# Patient Record
Sex: Female | Born: 1950 | Race: White | Hispanic: No | Marital: Married | Smoking: Never smoker
Health system: Southern US, Community
[De-identification: ages and names within clinical notes are randomized; demographics above are authoritative.]

## PROBLEM LIST (undated history)

## (undated) DIAGNOSIS — K5900 Constipation, unspecified: Secondary | ICD-10-CM

## (undated) DIAGNOSIS — M1990 Unspecified osteoarthritis, unspecified site: Secondary | ICD-10-CM

## (undated) DIAGNOSIS — I10 Essential (primary) hypertension: Secondary | ICD-10-CM

## (undated) DIAGNOSIS — J309 Allergic rhinitis, unspecified: Secondary | ICD-10-CM

## (undated) DIAGNOSIS — C801 Malignant (primary) neoplasm, unspecified: Secondary | ICD-10-CM

## (undated) DIAGNOSIS — E559 Vitamin D deficiency, unspecified: Secondary | ICD-10-CM

## (undated) DIAGNOSIS — G250 Essential tremor: Secondary | ICD-10-CM

## (undated) DIAGNOSIS — Z8542 Personal history of malignant neoplasm of other parts of uterus: Secondary | ICD-10-CM

## (undated) DIAGNOSIS — K219 Gastro-esophageal reflux disease without esophagitis: Secondary | ICD-10-CM

## (undated) DIAGNOSIS — R079 Chest pain, unspecified: Secondary | ICD-10-CM

## (undated) DIAGNOSIS — E7800 Pure hypercholesterolemia, unspecified: Secondary | ICD-10-CM

## (undated) DIAGNOSIS — E782 Mixed hyperlipidemia: Secondary | ICD-10-CM

## (undated) DIAGNOSIS — T7840XA Allergy, unspecified, initial encounter: Secondary | ICD-10-CM

## (undated) DIAGNOSIS — F32A Depression, unspecified: Secondary | ICD-10-CM

## (undated) DIAGNOSIS — B0050 Herpesviral ocular disease, unspecified: Secondary | ICD-10-CM

## (undated) DIAGNOSIS — M549 Dorsalgia, unspecified: Secondary | ICD-10-CM

## (undated) DIAGNOSIS — R600 Localized edema: Secondary | ICD-10-CM

## (undated) DIAGNOSIS — K635 Polyp of colon: Secondary | ICD-10-CM

## (undated) DIAGNOSIS — R3129 Other microscopic hematuria: Secondary | ICD-10-CM

## (undated) DIAGNOSIS — R809 Proteinuria, unspecified: Secondary | ICD-10-CM

## (undated) DIAGNOSIS — M2550 Pain in unspecified joint: Secondary | ICD-10-CM

## (undated) DIAGNOSIS — K760 Fatty (change of) liver, not elsewhere classified: Secondary | ICD-10-CM

## (undated) DIAGNOSIS — R002 Palpitations: Secondary | ICD-10-CM

## (undated) DIAGNOSIS — F329 Major depressive disorder, single episode, unspecified: Secondary | ICD-10-CM

## (undated) DIAGNOSIS — K589 Irritable bowel syndrome without diarrhea: Secondary | ICD-10-CM

## (undated) HISTORY — DX: Allergy, unspecified, initial encounter: T78.40XA

## (undated) HISTORY — DX: Polyp of colon: K63.5

## (undated) HISTORY — DX: Essential (primary) hypertension: I10

## (undated) HISTORY — DX: Allergic rhinitis, unspecified: J30.9

## (undated) HISTORY — DX: Essential tremor: G25.0

## (undated) HISTORY — DX: Depression, unspecified: F32.A

## (undated) HISTORY — DX: Other microscopic hematuria: R31.29

## (undated) HISTORY — PX: OTHER SURGICAL HISTORY: SHX169

## (undated) HISTORY — DX: Malignant (primary) neoplasm, unspecified: C80.1

## (undated) HISTORY — DX: Proteinuria, unspecified: R80.9

## (undated) HISTORY — DX: Herpesviral ocular disease, unspecified: B00.50

## (undated) HISTORY — DX: Irritable bowel syndrome, unspecified: K58.9

## (undated) HISTORY — DX: Personal history of malignant neoplasm of other parts of uterus: Z85.42

## (undated) HISTORY — DX: Palpitations: R00.2

## (undated) HISTORY — PX: ABDOMINAL HYSTERECTOMY: SHX81

## (undated) HISTORY — DX: Gastro-esophageal reflux disease without esophagitis: K21.9

## (undated) HISTORY — DX: Chest pain, unspecified: R07.9

## (undated) HISTORY — DX: Fatty (change of) liver, not elsewhere classified: K76.0

## (undated) HISTORY — DX: Localized edema: R60.0

## (undated) HISTORY — PX: APPENDECTOMY: SHX54

## (undated) HISTORY — DX: Mixed hyperlipidemia: E78.2

## (undated) HISTORY — DX: Pain in unspecified joint: M25.50

## (undated) HISTORY — PX: TUBAL LIGATION: SHX77

## (undated) HISTORY — DX: Vitamin D deficiency, unspecified: E55.9

## (undated) HISTORY — DX: Pure hypercholesterolemia, unspecified: E78.00

## (undated) HISTORY — DX: Unspecified osteoarthritis, unspecified site: M19.90

## (undated) HISTORY — DX: Constipation, unspecified: K59.00

## (undated) HISTORY — DX: Major depressive disorder, single episode, unspecified: F32.9

## (undated) HISTORY — DX: Dorsalgia, unspecified: M54.9

---

## 2000-04-01 ENCOUNTER — Other Ambulatory Visit: Admission: RE | Admit: 2000-04-01 | Discharge: 2000-04-01 | Payer: Self-pay | Admitting: Obstetrics & Gynecology

## 2002-06-22 ENCOUNTER — Other Ambulatory Visit: Admission: RE | Admit: 2002-06-22 | Discharge: 2002-06-22 | Payer: Self-pay | Admitting: Obstetrics & Gynecology

## 2003-06-28 ENCOUNTER — Other Ambulatory Visit: Admission: RE | Admit: 2003-06-28 | Discharge: 2003-06-28 | Payer: Self-pay | Admitting: Obstetrics & Gynecology

## 2004-08-13 ENCOUNTER — Other Ambulatory Visit: Admission: RE | Admit: 2004-08-13 | Discharge: 2004-08-13 | Payer: Self-pay | Admitting: Obstetrics & Gynecology

## 2005-06-20 ENCOUNTER — Encounter: Admission: RE | Admit: 2005-06-20 | Discharge: 2005-07-07 | Payer: Self-pay | Admitting: *Deleted

## 2006-01-23 DIAGNOSIS — K635 Polyp of colon: Secondary | ICD-10-CM

## 2006-01-23 HISTORY — DX: Polyp of colon: K63.5

## 2006-10-09 ENCOUNTER — Ambulatory Visit: Payer: Self-pay

## 2009-07-08 DIAGNOSIS — Z8542 Personal history of malignant neoplasm of other parts of uterus: Secondary | ICD-10-CM

## 2009-07-08 HISTORY — DX: Personal history of malignant neoplasm of other parts of uterus: Z85.42

## 2009-07-12 ENCOUNTER — Ambulatory Visit: Admission: RE | Admit: 2009-07-12 | Discharge: 2009-07-12 | Payer: Self-pay | Admitting: Gynecology

## 2009-07-25 ENCOUNTER — Inpatient Hospital Stay (HOSPITAL_COMMUNITY): Admission: RE | Admit: 2009-07-25 | Discharge: 2009-07-27 | Payer: Self-pay | Admitting: Gynecology

## 2009-07-25 ENCOUNTER — Encounter (INDEPENDENT_AMBULATORY_CARE_PROVIDER_SITE_OTHER): Payer: Self-pay | Admitting: Obstetrics & Gynecology

## 2009-09-07 ENCOUNTER — Ambulatory Visit: Admission: RE | Admit: 2009-09-07 | Discharge: 2009-09-07 | Payer: Self-pay | Admitting: Gynecologic Oncology

## 2009-10-26 ENCOUNTER — Other Ambulatory Visit: Admission: RE | Admit: 2009-10-26 | Discharge: 2009-10-26 | Payer: Self-pay | Admitting: Gynecologic Oncology

## 2009-10-26 ENCOUNTER — Ambulatory Visit: Admission: RE | Admit: 2009-10-26 | Discharge: 2009-10-26 | Payer: Self-pay | Admitting: Gynecologic Oncology

## 2010-03-01 ENCOUNTER — Encounter: Admission: RE | Admit: 2010-03-01 | Discharge: 2010-03-01 | Payer: Self-pay | Admitting: Surgery

## 2010-03-08 HISTORY — PX: HERNIA REPAIR: SHX51

## 2010-04-03 ENCOUNTER — Ambulatory Visit (HOSPITAL_COMMUNITY): Admission: RE | Admit: 2010-04-03 | Discharge: 2010-04-05 | Payer: Self-pay | Admitting: Surgery

## 2010-05-30 ENCOUNTER — Other Ambulatory Visit: Admission: RE | Admit: 2010-05-30 | Discharge: 2010-05-30 | Payer: Self-pay | Admitting: Gynecology

## 2010-05-30 ENCOUNTER — Ambulatory Visit
Admission: RE | Admit: 2010-05-30 | Discharge: 2010-05-30 | Payer: Self-pay | Source: Home / Self Care | Admitting: Gynecology

## 2010-09-20 LAB — DIFFERENTIAL
Basophils Absolute: 0 10*3/uL (ref 0.0–0.1)
Basophils Relative: 0 % (ref 0–1)
Eosinophils Absolute: 0.2 10*3/uL (ref 0.0–0.7)
Eosinophils Relative: 3 % (ref 0–5)
Lymphs Abs: 2.4 10*3/uL (ref 0.7–4.0)
Monocytes Absolute: 0.6 10*3/uL (ref 0.1–1.0)
Monocytes Relative: 9 % (ref 3–12)
Neutrophils Relative %: 52 % (ref 43–77)

## 2010-09-20 LAB — BASIC METABOLIC PANEL
BUN: 18 mg/dL (ref 6–23)
Chloride: 108 mEq/L (ref 96–112)
Creatinine, Ser: 0.83 mg/dL (ref 0.4–1.2)
GFR calc non Af Amer: >60 mL/min (ref >60)
Potassium: 4.1 mEq/L (ref 3.5–5.1)

## 2010-09-20 LAB — SURGICAL PCR SCREEN: Staphylococcus aureus: POSITIVE — AB

## 2010-09-20 LAB — CBC
Hemoglobin: 13.2 g/dL (ref 12.0–15.0)
RDW: 14.3 % (ref 11.5–15.5)
WBC: 6.7 10*3/uL (ref 4.0–10.5)

## 2010-09-23 LAB — COMPREHENSIVE METABOLIC PANEL
ALT: 33 U/L (ref 0–35)
AST: 28 U/L (ref 0–37)
Albumin: 4.2 g/dL (ref 3.5–5.2)
Alkaline Phosphatase: 69 U/L (ref 39–117)
CO2: 31 mEq/L (ref 19–32)
Chloride: 102 mEq/L (ref 96–112)
Creatinine, Ser: 0.88 mg/dL (ref 0.4–1.2)
Glucose, Bld: 90 mg/dL (ref 70–99)
Potassium: 3.8 mEq/L (ref 3.5–5.1)
Sodium: 141 mEq/L (ref 135–145)
Total Protein: 7.6 g/dL (ref 6.0–8.3)

## 2010-09-23 LAB — DIFFERENTIAL
Basophils Relative: 0 % (ref 0–1)
Eosinophils Absolute: 0.1 10*3/uL (ref 0.0–0.7)
Lymphocytes Relative: 44 % (ref 12–46)

## 2010-09-23 LAB — CBC
MCHC: 32.8 g/dL (ref 30.0–36.0)
MCV: 86.2 fL (ref 78.0–100.0)
WBC: 6.6 10*3/uL (ref 4.0–10.5)

## 2010-09-23 LAB — ABO/RH: ABO/RH(D): O POS

## 2010-09-23 LAB — TYPE AND SCREEN: ABO/RH(D): O POS

## 2010-09-24 LAB — CBC
HCT: 34.8 % — ABNORMAL LOW (ref 36.0–46.0)
Hemoglobin: 11.4 g/dL — ABNORMAL LOW (ref 12.0–15.0)
MCV: 86.2 fL (ref 78.0–100.0)
RBC: 4.04 MIL/uL (ref 3.87–5.11)
WBC: 10.3 10*3/uL (ref 4.0–10.5)

## 2010-09-24 LAB — BASIC METABOLIC PANEL
BUN: 10 mg/dL (ref 6–23)
CO2: 26 mEq/L (ref 19–32)
Calcium: 8.4 mg/dL (ref 8.4–10.5)
Chloride: 104 mEq/L (ref 96–112)
Creatinine, Ser: 0.96 mg/dL (ref 0.4–1.2)
GFR calc Af Amer: >60 mL/min (ref >60)
GFR calc non Af Amer: 60 mL/min — ABNORMAL LOW (ref >60)
Glucose, Bld: 171 mg/dL — ABNORMAL HIGH (ref 70–99)
Potassium: 4.4 mEq/L (ref 3.5–5.1)
Sodium: 137 mEq/L (ref 135–145)

## 2011-05-15 ENCOUNTER — Encounter: Payer: Self-pay | Admitting: Physician Assistant

## 2011-05-15 DIAGNOSIS — B0050 Herpesviral ocular disease, unspecified: Secondary | ICD-10-CM | POA: Insufficient documentation

## 2011-05-15 DIAGNOSIS — I10 Essential (primary) hypertension: Secondary | ICD-10-CM | POA: Insufficient documentation

## 2011-05-15 DIAGNOSIS — J309 Allergic rhinitis, unspecified: Secondary | ICD-10-CM | POA: Insufficient documentation

## 2011-05-15 DIAGNOSIS — G250 Essential tremor: Secondary | ICD-10-CM | POA: Insufficient documentation

## 2011-05-15 DIAGNOSIS — Z8542 Personal history of malignant neoplasm of other parts of uterus: Secondary | ICD-10-CM | POA: Insufficient documentation

## 2011-05-15 DIAGNOSIS — E782 Mixed hyperlipidemia: Secondary | ICD-10-CM | POA: Insufficient documentation

## 2011-05-15 DIAGNOSIS — G43909 Migraine, unspecified, not intractable, without status migrainosus: Secondary | ICD-10-CM | POA: Insufficient documentation

## 2011-06-24 ENCOUNTER — Ambulatory Visit: Payer: BC Managed Care – PPO

## 2011-06-24 DIAGNOSIS — R7989 Other specified abnormal findings of blood chemistry: Secondary | ICD-10-CM

## 2011-06-29 ENCOUNTER — Ambulatory Visit (INDEPENDENT_AMBULATORY_CARE_PROVIDER_SITE_OTHER): Payer: BC Managed Care – PPO

## 2011-06-29 DIAGNOSIS — R7309 Other abnormal glucose: Secondary | ICD-10-CM

## 2011-06-29 DIAGNOSIS — R319 Hematuria, unspecified: Secondary | ICD-10-CM

## 2011-07-10 ENCOUNTER — Encounter: Payer: Self-pay | Admitting: Gynecologic Oncology

## 2011-07-12 ENCOUNTER — Other Ambulatory Visit (HOSPITAL_COMMUNITY)
Admission: RE | Admit: 2011-07-12 | Discharge: 2011-07-12 | Disposition: A | Discharge status: Home / Self Care | Payer: BC Managed Care – PPO | Source: Ambulatory Visit | Attending: Gynecology | Admitting: Gynecology

## 2011-07-12 ENCOUNTER — Ambulatory Visit: Payer: BC Managed Care – PPO | Attending: Gynecology | Admitting: Gynecology

## 2011-07-12 ENCOUNTER — Encounter: Payer: Self-pay | Admitting: Gynecology

## 2011-07-12 VITALS — BP 132/80 | HR 62 | Temp 97.9°F | Resp 16 | Ht 61.22 in | Wt 175.1 lb

## 2011-07-12 DIAGNOSIS — Z01419 Encounter for gynecological examination (general) (routine) without abnormal findings: Secondary | ICD-10-CM | POA: Insufficient documentation

## 2011-07-12 DIAGNOSIS — Z90710 Acquired absence of both cervix and uterus: Secondary | ICD-10-CM | POA: Insufficient documentation

## 2011-07-12 DIAGNOSIS — Z9079 Acquired absence of other genital organ(s): Secondary | ICD-10-CM | POA: Insufficient documentation

## 2011-07-12 DIAGNOSIS — C541 Malignant neoplasm of endometrium: Secondary | ICD-10-CM

## 2011-07-12 DIAGNOSIS — Z79899 Other long term (current) drug therapy: Secondary | ICD-10-CM | POA: Insufficient documentation

## 2011-07-12 DIAGNOSIS — C549 Malignant neoplasm of corpus uteri, unspecified: Secondary | ICD-10-CM | POA: Insufficient documentation

## 2011-07-12 NOTE — Patient Instructions (Signed)
Return to the care of Dr. Aldona Bar for exams every 6 months to complete 5 years of followup

## 2011-07-12 NOTE — Progress Notes (Signed)
Consult Note: Gyn-Onc   Carly Walker 61 y.o. female  Chief Complaint  Patient presents with  . Endo cancer    Follow up    Interval History: The patient returns today for annual checkup. Since her last visit she's done well she denies any GI or GU symptoms except for recently diagnosed hematuria. She is scheduled to see a urologist in the near future. She denies any pelvic pain pressure vaginal bleeding or discharge. Functional status is excellent.  HPI: Stage IA grade 1 endometrial cancer undergoing initial TAH/BSO in January 2011. There is no myometrial invasion and no adjuvant therapy was recommended  Allergies  Allergen Reactions  . Ace Inhibitors Cough  . Codeine Nausea Only  . Sulfa Antibiotics Hives    Past Medical History  Diagnosis Date  . Migraine   . Allergic rhinitis, cause unspecified   . Essential hypertension, benign   . Mixed hyperlipidemia   . History of endometrial cancer 07/2009  . Tremor, essential   . Ophthalmic herpes simplex     Past Surgical History  Procedure Date  . Appendectomy   . Hernia repair 03/2010  . Tubal ligation   . Cesarean section   . Repeat cesarean section   . Abdominal hysterectomy     Current Outpatient Prescriptions  Medication Sig Dispense Refill  . acyclovir (ZOVIRAX) 400 MG tablet Take 400 mg by mouth daily. Ophthalmic HSV 1       . B Complex-C (B-COMPLEX WITH VITAMIN C) tablet Take 1 tablet by mouth daily.        . Cholecalciferol (D-3-5) 5000 UNITS capsule Take 5,000 Units by mouth daily.        . fluticasone (FLONASE) 50 MCG/ACT nasal spray Place 2 sprays into the nose daily.        Marland Kitchen loteprednol (LOTEMAX) 0.5 % ophthalmic suspension Place 1 drop into the right eye daily.        . Multiple Vitamin (MULTIVITAMIN) tablet Take 1 tablet by mouth daily. Over the counter, for over 50 individuals( called 50+)       . Omega-3 Fatty Acids (FISH OIL PO) Take 1,400 mg by mouth 2 (two) times daily.        . pravastatin  (PRAVACHOL) 40 MG tablet Take 40 mg by mouth daily.        . propranolol (INDERAL LA) 80 MG 24 hr capsule Take 80 mg by mouth daily.        . Red Yeast Rice 600 MG CAPS Take 600 mg by mouth 2 (two) times daily.          History   Social History  . Marital Status: Married    Spouse Name: N/A    Number of Children: N/A  . Years of Education: N/A   Occupational History  . Not on file.   Social History Main Topics  . Smoking status: Never Smoker   . Smokeless tobacco: Not on file  . Alcohol Use: No  . Drug Use: No  . Sexually Active: Not Currently   Other Topics Concern  . Not on file   Social History Narrative  . No narrative on file    Family History  Problem Relation Age of Onset  . Breast cancer Mother     Review of Systems: 10 point review of systems is negative except as noted above  Vitals: Blood pressure 132/80, pulse 62, temperature 97.9 F (36.6 C), temperature source Oral, resp. rate 16, height 5' 1.22" (1.555 m),  weight 175 lb 1.6 oz (79.425 kg).  Physical Exam: In general this is a healthy white female no acute distress HEENT is negative  Neck supple thyromegaly  There is no supraclavicular or inguinal adenopathy  The abdomen is obese soft nontender no masses organomegaly ascites or hernias are noted.  Pelvic exam  EGBUS vagina bladder urethra are normal  The vaginal cuff is well supported no lesions are noted.  Bimanual exam reveals no masses nodularity or induration. Rectovaginal exam confirms. Cervix and uterus are surgically absent.  Assessment/Plan: Stage IA grade 1 endometrial adenocarcinoma initially diagnosed January 2011. [name] clinically free of disease  Pap smears her obtained  At this juncture we will return the patient to the care of Dr. Annamaria Helling. We will suggest she be seen every 6 months to complete 5 years of followup.   Jeannette Corpus, MD 07/12/2011, 10:20 AM                           Consult Note: Gyn-Onc   Carly Walker 61 y.o. female  Chief Complaint  Patient presents with  . Endo cancer    Follow up    Interval History:   HPI:  Allergies  Allergen Reactions  . Ace Inhibitors Cough  . Codeine Nausea Only  . Sulfa Antibiotics Hives    Past Medical History  Diagnosis Date  . Migraine   . Allergic rhinitis, cause unspecified   . Essential hypertension, benign   . Mixed hyperlipidemia   . History of endometrial cancer 07/2009  . Tremor, essential   . Ophthalmic herpes simplex     Past Surgical History  Procedure Date  . Appendectomy   . Hernia repair 03/2010  . Tubal ligation   . Cesarean section   . Repeat cesarean section   . Abdominal hysterectomy     Current Outpatient Prescriptions  Medication Sig Dispense Refill  . acyclovir (ZOVIRAX) 400 MG tablet Take 400 mg by mouth daily. Ophthalmic HSV 1       . B Complex-C (B-COMPLEX WITH VITAMIN C) tablet Take 1 tablet by mouth daily.        . Cholecalciferol (D-3-5) 5000 UNITS capsule Take 5,000 Units by mouth daily.        . fluticasone (FLONASE) 50 MCG/ACT nasal spray Place 2 sprays into the nose daily.        Marland Kitchen loteprednol (LOTEMAX) 0.5 % ophthalmic suspension Place 1 drop into the right eye daily.        . Multiple Vitamin (MULTIVITAMIN) tablet Take 1 tablet by mouth daily. Over the counter, for over 50 individuals( called 50+)       . Omega-3 Fatty Acids (FISH OIL PO) Take 1,400 mg by mouth 2 (two) times daily.        . pravastatin (PRAVACHOL) 40 MG tablet Take 40 mg by mouth daily.        . propranolol (INDERAL LA) 80 MG 24 hr capsule Take 80 mg by mouth daily.        . Red Yeast Rice 600 MG CAPS Take 600 mg by mouth 2 (two) times daily.          History   Social History  . Marital Status: Married    Spouse Name: N/A    Number of Children: N/A  . Years of Education: N/A   Occupational History  . Not on file.   Social History Main Topics  . Smoking status:  Never Smoker   .  Smokeless tobacco: Not on file  . Alcohol Use: No  . Drug Use: No  . Sexually Active: Not Currently   Other Topics Concern  . Not on file   Social History Narrative  . No narrative on file    Family History  Problem Relation Age of Onset  . Breast cancer Mother     Review of Systems:  Vitals: Blood pressure 132/80, pulse 62, temperature 97.9 F (36.6 C), temperature source Oral, resp. rate 16, height 5' 1.22" (1.555 m), weight 175 lb 1.6 oz (79.425 kg).  Physical Exam:  Assessment/Plan:   CLARKE-PEARSON,Hulet Ehrmann L, MD 07/12/2011, 10:20 AM

## 2011-07-12 NOTE — Progress Notes (Signed)
Addended by: Randel Pigg on: 07/12/2011 11:32 AM   Modules accepted: Orders

## 2011-07-29 ENCOUNTER — Telehealth: Payer: Self-pay | Admitting: *Deleted

## 2011-07-29 NOTE — Telephone Encounter (Signed)
Message left for pt with PAP results  

## 2011-09-12 ENCOUNTER — Encounter: Payer: Self-pay | Admitting: Physician Assistant

## 2011-11-06 ENCOUNTER — Ambulatory Visit (INDEPENDENT_AMBULATORY_CARE_PROVIDER_SITE_OTHER): Payer: BC Managed Care – PPO | Admitting: Physician Assistant

## 2011-11-06 VITALS — BP 124/79 | HR 56 | Temp 98.1°F | Resp 18 | Ht 61.5 in | Wt 171.0 lb

## 2011-11-06 DIAGNOSIS — S61209A Unspecified open wound of unspecified finger without damage to nail, initial encounter: Secondary | ICD-10-CM

## 2011-11-06 DIAGNOSIS — Z23 Encounter for immunization: Secondary | ICD-10-CM

## 2011-11-06 DIAGNOSIS — S61009A Unspecified open wound of unspecified thumb without damage to nail, initial encounter: Secondary | ICD-10-CM

## 2011-11-06 MED ORDER — AMOXICILLIN-POT CLAVULANATE 875-125 MG PO TABS: 1.0000 | ORAL_TABLET | Freq: Two times a day (BID) | ORAL | Status: AC

## 2011-11-06 NOTE — Progress Notes (Signed)
Patient ID: Carly Walker MRN: [id number], DOB: 06-04-1951, 61 y.o. Date of Encounter: 11/06/2011, 5:05 PM  Primary Physician: No primary provider on file.  Chief Complaint: Wound to right thumb  HPI: 61 y.o. year old female with history below presents with superficial wound to the tuft of the right thumb. 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 She was separating two dogs that some customers were looking at. She had the dogs separated without issue and she hit her thumb along a piece of the chain link fence that was exposed. She is very certain that it was not a dog that bit her. Both dogs have been fully vaccinated including rabies per the patient account. She immediately washed the wound thoroughly with soap and water followed by the application antibiotic ointment. Her last tetanus vaccine was about 10 years prior.    Past Medical History  Diagnosis Date  . Migraine   . Allergic rhinitis, cause unspecified   . Essential hypertension, benign   . Mixed hyperlipidemia   . History of endometrial cancer 07/2009  . Tremor, essential   . Ophthalmic herpes simplex      Home Meds: Prior to Admission medications   Medication Sig Start Date End Date Taking? Authorizing Provider  acyclovir (ZOVIRAX) 400 MG tablet Take 400 mg by mouth daily. Ophthalmic HSV 1    Yes Historical Provider, MD  B Complex-C (B-COMPLEX WITH VITAMIN C) tablet Take 1 tablet by mouth daily.     Yes Historical Provider, MD  Cholecalciferol (D-3-5) 5000 UNITS capsule Take 5,000 Units by mouth daily.     Yes Historical Provider, MD  fluticasone (FLONASE) 50 MCG/ACT nasal spray Place 2 sprays into the nose daily.     Yes Historical Provider, MD  Multiple Vitamin (MULTIVITAMIN) tablet Take 1 tablet by mouth daily. Over the counter, for over 50 individuals( called 50+)    Yes Historical Provider, MD  Omega-3 Fatty Acids (FISH OIL PO) Take 1,400 mg by mouth 2 (two) times daily.     Yes Historical Provider, MD    pravastatin (PRAVACHOL) 40 MG tablet Take 40 mg by mouth daily.     Yes Historical Provider, MD  propranolol (INDERAL LA) 80 MG 24 hr capsule Take 80 mg by mouth daily.     Yes Historical Provider, MD  Red Yeast Rice 600 MG CAPS Take 600 mg by mouth 2 (two) times daily.     Yes Historical Provider, MD         loteprednol (LOTEMAX) 0.5 % ophthalmic suspension Place 1 drop into the right eye daily.      Historical Provider, MD    Allergies:  Allergies  Allergen Reactions  . Ace Inhibitors Cough  . Codeine Nausea Only  . Sulfa Antibiotics Hives    History   Social History  . Marital Status: Married    Spouse Name: N/A    Number of Children: N/A  . Years of Education: N/A   Occupational History  . Not on file.   Social History Main Topics  . Smoking status: Never Smoker   . Smokeless tobacco: Not on file  . Alcohol Use: No  . Drug Use: No  . Sexually Active: Not Currently   Other Topics Concern  . Not on file   Social History Narrative  . No narrative on file     Review of Systems: Constitutional: negative for chills, fever, night sweats, weight changes, or fatigue  HEENT: negative for vision changes, or hearing  loss Cardiovascular: negative for chest pain or palpitations Respiratory: negative for hemoptysis, wheezing, shortness of breath, or cough Abdominal: negative for abdominal pain, nausea, vomiting, diarrhea, or constipation Dermatological: negative for rash Neurologic: negative for headache, dizziness, or syncope All other systems reviewed and are otherwise negative with the exception to those above and in the HPI.   Physical Exam: Blood pressure 124/79, pulse 56, temperature 98.1 F (36.7 C), temperature source Oral, resp. rate 18, height 5' 1.5" (1.562 m), weight 171 lb (77.565 kg)., Body mass index is 31.79 kg/(m^2). General: Well developed, well nourished, in no acute distress. Head: Normocephalic, atraumatic, eyes without discharge, sclera non-icteric,  nares are without discharge.   Neck: Supple. No thyromegaly. Full ROM. No lymphadenopathy. Lungs: Clear bilaterally to auscultation without wheezes, rales, or rhonchi. Breathing is unlabored. Heart: RRR with S1 S2. No murmurs, rubs, or gallops appreciated. Msk:  Strength and tone normal for age. Extremities/Skin: Tuft of right thumb with with superficial wound. No erythema or STS. No TTP. FROM. 5/5 strength. Full flexion and extension. Cap refill less than 2 seconds. Warm and dry. No clubbing or cyanosis. No edema. No rashes or suspicious lesions. Neuro: Alert and oriented X 3. Moves all extremities spontaneously. Gait is normal. CNII-XII grossly in tact. Psych:  Responds to questions appropriately with a normal affect.     ASSESSMENT AND PLAN:  61 y.o. year old female with superficial wound to distal right thumb secondary to hitting in along a chain link fence -Augmentin 875/125 mg #20 1 po bid no RF -TDaP today -Washed and dressed -RTC precautions  Signed, Eula Listen, PA-C 11/06/2011 5:05 PM

## 2012-09-04 ENCOUNTER — Encounter: Payer: Self-pay | Admitting: Physician Assistant

## 2012-09-04 DIAGNOSIS — R3129 Other microscopic hematuria: Secondary | ICD-10-CM | POA: Insufficient documentation

## 2012-09-04 DIAGNOSIS — N3941 Urge incontinence: Secondary | ICD-10-CM | POA: Insufficient documentation

## 2012-09-24 ENCOUNTER — Ambulatory Visit (INDEPENDENT_AMBULATORY_CARE_PROVIDER_SITE_OTHER): Payer: BC Managed Care – PPO | Admitting: Emergency Medicine

## 2012-09-24 VITALS — BP 182/84 | HR 77 | Temp 98.4°F | Resp 18 | Wt 175.0 lb

## 2012-09-24 DIAGNOSIS — I10 Essential (primary) hypertension: Secondary | ICD-10-CM

## 2012-09-24 DIAGNOSIS — E785 Hyperlipidemia, unspecified: Secondary | ICD-10-CM

## 2012-09-24 LAB — COMPREHENSIVE METABOLIC PANEL
AST: 30 U/L (ref 0–37)
Albumin: 4.4 g/dL (ref 3.5–5.2)
BUN: 17 mg/dL (ref 6–23)
Calcium: 9.6 mg/dL (ref 8.4–10.5)
Chloride: 105 mEq/L (ref 96–112)
Glucose, Bld: 104 mg/dL — ABNORMAL HIGH (ref 70–99)
Potassium: 4.4 mEq/L (ref 3.5–5.3)
Total Bilirubin: 0.6 mg/dL (ref 0.3–1.2)

## 2012-09-24 LAB — LIPID PANEL
Cholesterol: 182 mg/dL (ref 0–200)
LDL Cholesterol: 102 mg/dL — ABNORMAL HIGH (ref 0–99)
Total CHOL/HDL Ratio: 4 Ratio
VLDL: 34 mg/dL (ref 0–40)

## 2012-09-24 LAB — POCT CBC
Granulocyte percent: 48.5 %G (ref 37–80)
HCT, POC: 45.7 % (ref 37.7–47.9)
Hemoglobin: 14.7 g/dL (ref 12.2–16.2)
MCH, POC: 29.8 pg (ref 27–31.2)
MCV: 92.7 fL (ref 80–97)
MID (cbc): 0.5 (ref 0–0.9)
POC Granulocyte: 2.7 (ref 2–6.9)
POC MID %: 8.2 %M (ref 0–12)
RDW, POC: 13.8 %
WBC: 5.5 10*3/uL (ref 4.6–10.2)

## 2012-09-24 LAB — HM MAMMOGRAPHY

## 2012-09-24 MED ORDER — PROPRANOLOL HCL ER 80 MG PO CP24: 80.0000 mg | ORAL_CAPSULE | Freq: Every day | ORAL | Status: DC

## 2012-09-24 MED ORDER — PRAVASTATIN SODIUM 40 MG PO TABS: 40.0000 mg | ORAL_TABLET | Freq: Every day | ORAL | Status: DC

## 2012-09-24 NOTE — Progress Notes (Signed)
  Subjective:    Patient ID: Carly Walker, female    DOB: 20-Dec-1950, 62 y.o.   MRN: [id number]  HPI patient enters for followup of her high blood pressure and high cholesterol. 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 She otherwise has been taking her medication. She is not complaining of any other symptoms at the present time such as chest pain shortness of breath.    Review of Systems     Objective:   Physical Exam patient is alert and cooperative in no distress. Her chest is clear. Cardiac is regular rate without murmurs. Blood pressures were repeated and were again found to be elevated.        Assessment & Plan:  Ahead and check lipid panel seem at refill her medications and have close followup of her blood pressure to be sure it with reinstitution of her medications her blood pressure gets in range.

## 2012-09-29 LAB — HM MAMMOGRAPHY

## 2012-10-09 ENCOUNTER — Encounter: Payer: Self-pay | Admitting: Physician Assistant

## 2012-10-27 ENCOUNTER — Ambulatory Visit (INDEPENDENT_AMBULATORY_CARE_PROVIDER_SITE_OTHER): Payer: BC Managed Care – PPO | Admitting: Emergency Medicine

## 2012-10-27 ENCOUNTER — Encounter: Payer: Self-pay | Admitting: Emergency Medicine

## 2012-10-27 VITALS — BP 158/74 | HR 51 | Temp 98.1°F | Resp 16 | Ht 61.0 in | Wt 174.0 lb

## 2012-10-27 DIAGNOSIS — E785 Hyperlipidemia, unspecified: Secondary | ICD-10-CM

## 2012-10-27 DIAGNOSIS — R079 Chest pain, unspecified: Secondary | ICD-10-CM

## 2012-10-27 DIAGNOSIS — R739 Hyperglycemia, unspecified: Secondary | ICD-10-CM

## 2012-10-27 DIAGNOSIS — E8881 Metabolic syndrome: Secondary | ICD-10-CM

## 2012-10-27 DIAGNOSIS — I10 Essential (primary) hypertension: Secondary | ICD-10-CM

## 2012-10-27 LAB — POCT GLYCOSYLATED HEMOGLOBIN (HGB A1C): Hemoglobin A1C: 6

## 2012-10-27 LAB — LIPID PANEL
Cholesterol: 206 mg/dL — ABNORMAL HIGH (ref 0–200)
HDL: 48 mg/dL
LDL Cholesterol: 122 mg/dL — ABNORMAL HIGH (ref 0–99)
Total CHOL/HDL Ratio: 4.3 ratio
Triglycerides: 181 mg/dL — ABNORMAL HIGH
VLDL: 36 mg/dL (ref 0–40)

## 2012-10-27 LAB — GLUCOSE, POCT (MANUAL RESULT ENTRY): POC Glucose: 111 mg/dL — AB (ref 70–99)

## 2012-10-27 MED ORDER — AMLODIPINE BESYLATE 2.5 MG PO TABS: 2.5000 mg | ORAL_TABLET | Freq: Every day | ORAL | Status: DC

## 2012-10-27 NOTE — Progress Notes (Signed)
  Subjective:    Patient ID: Carly Walker, female    DOB: 27-Jun-1951, 62 y.o.   MRN: [id number]  HPI 62 yo female here for check on blood pressure and cholesterol. Brought log of pressures today. Systolic ranging between 170's-130's. Diastolic is good in the high 60's-70's. Today, took pressure at home which was 125/68. She does exercise, goes to the [facility] 4-5 times/week. 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  Also reports that she has been having chest pressure. Thinks it could be anxiety, as she can calm down and breathe and it goes away. Has been ongoing for the last month. Thinks the stress of keeping up with her blood pressure could be stressing her out. Denies shortness of breath or palpitations.   Up to date on mammogram colonoscopy.  Would like zoster vaccine.   Review of Systems  Constitutional: Negative for fever, chills and unexpected weight change.  Respiratory: Positive for chest tightness. Negative for cough and shortness of breath.   Cardiovascular: Negative for chest pain, palpitations and leg swelling.  Gastrointestinal: Negative for nausea, vomiting, diarrhea and constipation.       Objective:   Physical Exam  Constitutional: She appears well-developed and well-nourished.  HENT:  Head: Normocephalic and atraumatic.  Neck: Normal range of motion. Neck supple.  Cardiovascular: Normal rate, regular rhythm, normal heart sounds and intact distal pulses.   Pulmonary/Chest: Effort normal and breath sounds normal.  Abdominal: Soft. Bowel sounds are normal. There is no tenderness (denies feeling in lower abdomen due to hernia surgery).  Skin: Skin is warm and dry.    BP recheck: 140/72      Assessment & Plan:  Blood pressures are not quite at goal we'll add Norvasc 2.5 mg. EKG baseline will be done. She had a stress test done in 2008 which was normal.

## 2012-10-28 ENCOUNTER — Telehealth: Payer: Self-pay

## 2012-10-28 NOTE — Telephone Encounter (Signed)
Pt is calling back because she had missed call about labs Call back number is [phone]

## 2012-10-28 NOTE — Telephone Encounter (Signed)
Left a message for patient to return call.

## 2012-11-12 LAB — HM MAMMOGRAPHY

## 2013-01-04 ENCOUNTER — Encounter: Payer: Self-pay | Admitting: Emergency Medicine

## 2013-04-01 ENCOUNTER — Ambulatory Visit (INDEPENDENT_AMBULATORY_CARE_PROVIDER_SITE_OTHER): Payer: BC Managed Care – PPO | Admitting: Family Medicine

## 2013-04-01 VITALS — BP 108/80 | HR 50 | Temp 98.0°F | Resp 16 | Ht 61.0 in | Wt 153.0 lb

## 2013-04-01 DIAGNOSIS — L237 Allergic contact dermatitis due to plants, except food: Secondary | ICD-10-CM

## 2013-04-01 DIAGNOSIS — L255 Unspecified contact dermatitis due to plants, except food: Secondary | ICD-10-CM

## 2013-04-01 MED ORDER — METHYLPREDNISOLONE ACETATE 80 MG/ML IJ SUSP
120.0000 mg | Freq: Once | INTRAMUSCULAR | Status: AC
  Administered 2013-04-01: 120 mg via INTRAMUSCULAR

## 2013-04-01 MED ORDER — TRIAMCINOLONE ACETONIDE 0.1 % EX CREA: TOPICAL_CREAM | Freq: Three times a day (TID) | CUTANEOUS | Status: DC

## 2013-04-01 NOTE — Patient Instructions (Addendum)
Poison Ivy Poison ivy is a inflammation of the skin (contact dermatitis) caused by touching the allergens on the leaves of the ivy plant following previous exposure to the plant. The rash usually appears 48 hours after exposure. The rash is usually bumps (papules) or blisters (vesicles) in a linear pattern. Depending on your own sensitivity, the rash may simply cause redness and itching, or it may also progress to blisters which may break open. These must be well cared for to prevent secondary bacterial (germ) infection, followed by scarring. Keep any open areas dry, clean, dressed, and covered with an antibacterial ointment if needed. The eyes may also get puffy. The puffiness is worst in the morning and gets better as the day progresses. This dermatitis usually heals without scarring, within 2 to 3 weeks without treatment. HOME CARE INSTRUCTIONS  Thoroughly wash with soap and water as soon as you have been exposed to poison ivy. You have about one half hour to remove the plant resin before it will cause the rash. This washing will destroy the oil or antigen on the skin that is causing, or will cause, the rash. Be sure to wash under your fingernails as any plant resin there will continue to spread the rash. Do not rub skin vigorously when washing affected area. Poison ivy cannot spread if no oil from the plant remains on your body. A rash that has progressed to weeping sores will not spread the rash unless you have not washed thoroughly. It is also important to wash any clothes you have been wearing as these may carry active allergens. The rash will return if you wear the unwashed clothing, even several days later. Avoidance of the plant in the future is the best measure. Poison ivy plant can be recognized by the number of leaves. Generally, poison ivy has three leaves with flowering branches on a single stem. Diphenhydramine may be purchased over the counter and used as needed for itching. Do not drive with  this medication if it makes you drowsy.Ask your caregiver about medication for children. SEEK MEDICAL CARE IF:  Open sores develop.  Redness spreads beyond area of rash.  You notice purulent (pus-like) discharge.  You have increased pain.  Other signs of infection develop (such as fever). Document Released: 06/21/2000 Document Revised: 09/16/2011 Document Reviewed: 05/10/2009 Exit[employer] Patient Information 2014 [employer].  

## 2013-05-13 ENCOUNTER — Other Ambulatory Visit: Payer: Self-pay

## 2013-05-27 NOTE — Progress Notes (Signed)
Subjective:    Patient ID: Carly Walker, female    DOB: 30-Sep-1950, 62 y.o.   MRN: [id number] Chief Complaint  Patient presents with  . Rash    all over body    HPI  Exposed to poison ivy during yard work last week. Has been using topical calamine and benadyl w/o relief and still spreading.  Past Medical History  Diagnosis Date  . Migraine   . Allergic rhinitis, cause unspecified   . Essential hypertension, benign   . Mixed hyperlipidemia   . History of endometrial cancer 07/2009  . Tremor, essential   . Ophthalmic herpes simplex   . Cancer   . GERD (gastroesophageal reflux disease)    Current Outpatient Prescriptions on File Prior to Visit  Medication Sig Dispense Refill  . acyclovir (ZOVIRAX) 400 MG tablet Take 400 mg by mouth daily. Ophthalmic HSV 1       . amLODipine (NORVASC) 2.5 MG tablet Take 1 tablet (2.5 mg total) by mouth daily.  90 tablet  3  . aspirin 81 MG tablet Take 81 mg by mouth daily.      . B Complex-C (B-COMPLEX WITH VITAMIN C) tablet Take 1 tablet by mouth daily.        . Cholecalciferol (D-3-5) 5000 UNITS capsule Take 5,000 Units by mouth daily.        . Glucosamine-Chondroit-Vit C-Mn (GLUCOSAMINE CHONDR 1500 COMPLX) CAPS Take 1,500 each by mouth daily.      Marland Kitchen MELATONIN PO Take 5 mg by mouth.      . Multiple Vitamin (MULTIVITAMIN) tablet Take 1 tablet by mouth daily. Over the counter, for over 50 individuals( called 50+)       . Omega-3 Fatty Acids (FISH OIL PO) Take 1,400 mg by mouth 2 (two) times daily.        Marland Kitchen omeprazole (PRILOSEC) 20 MG capsule Take 20 mg by mouth daily.      . pravastatin (PRAVACHOL) 40 MG tablet Take 1 tablet (40 mg total) by mouth daily.  90 tablet  3  . propranolol ER (INDERAL LA) 80 MG 24 hr capsule Take 1 capsule (80 mg total) by mouth daily.  90 capsule  3  . Red Yeast Rice 600 MG CAPS Take 600 mg by mouth 2 (two) times daily.        Marland Kitchen loteprednol (LOTEMAX) 0.5 % ophthalmic suspension Place 1 drop into the right eye daily.          No current facility-administered medications on file prior to visit.   Allergies  Allergen Reactions  . Ace Inhibitors Cough  . Codeine Nausea Only  . Sulfa Antibiotics Hives    Review of Systems  Constitutional: Negative for fever, chills and diaphoresis.  Musculoskeletal: Negative for arthralgias and joint swelling.  Skin: Positive for color change and rash. Negative for pallor and wound.  Hematological: Negative for adenopathy. Does not bruise/bleed easily.  Psychiatric/Behavioral: Positive for sleep disturbance.      BP 108/80  Pulse 50  Temp(Src) 98 F (36.7 C) (Oral)  Resp 16  Ht 5\' 1"  (1.549 m)  Wt 153 lb (69.4 kg)  BMI 28.92 kg/m2  SpO2 99% Objective:   Physical Exam  Constitutional: She is oriented to person, place, and time. She appears well-developed and well-nourished. No distress.  HENT:  Head: Normocephalic and atraumatic.  Right Ear: External ear normal.  Eyes: Conjunctivae are normal. No scleral icterus.  Pulmonary/Chest: Effort normal.  Neurological: She is alert and oriented to person,  place, and time.  Skin: Skin is warm and dry. Rash noted. Rash is maculopapular. She is not diaphoretic. No erythema.  Psychiatric: She has a normal mood and affect. Her behavior is normal.          Assessment & Plan:   Poison ivy dermatitis - Plan: methylPREDNISolone acetate (DEPO-MEDROL) injection 120 mg  Meds ordered this encounter  Medications  . phentermine 15 MG capsule    Sig: Take 15 mg by mouth every morning.  . triamcinolone cream (KENALOG) 0.1 %    Sig: Apply topically 3 (three) times daily.    Dispense:  454 g    Refill:  1  . methylPREDNISolone acetate (DEPO-MEDROL) injection 120 mg    Sig:     Norberto Sorenson, MD MPH

## 2013-05-30 ENCOUNTER — Encounter: Payer: Self-pay | Admitting: Emergency Medicine

## 2013-08-23 ENCOUNTER — Other Ambulatory Visit: Payer: Self-pay | Admitting: Emergency Medicine

## 2013-08-30 ENCOUNTER — Other Ambulatory Visit: Payer: Self-pay | Admitting: Emergency Medicine

## 2013-10-01 ENCOUNTER — Ambulatory Visit (INDEPENDENT_AMBULATORY_CARE_PROVIDER_SITE_OTHER): Payer: BC Managed Care – PPO | Admitting: Emergency Medicine

## 2013-10-01 ENCOUNTER — Encounter: Payer: Self-pay | Admitting: Emergency Medicine

## 2013-10-01 VITALS — BP 129/79 | HR 53 | Temp 98.2°F | Resp 16 | Ht 61.0 in | Wt 163.8 lb

## 2013-10-01 DIAGNOSIS — R7309 Other abnormal glucose: Secondary | ICD-10-CM

## 2013-10-01 DIAGNOSIS — E785 Hyperlipidemia, unspecified: Secondary | ICD-10-CM

## 2013-10-01 DIAGNOSIS — R739 Hyperglycemia, unspecified: Secondary | ICD-10-CM

## 2013-10-01 DIAGNOSIS — E782 Mixed hyperlipidemia: Secondary | ICD-10-CM

## 2013-10-01 DIAGNOSIS — I10 Essential (primary) hypertension: Secondary | ICD-10-CM

## 2013-10-01 LAB — POCT GLYCOSYLATED HEMOGLOBIN (HGB A1C): Hemoglobin A1C: 5.9

## 2013-10-01 LAB — CBC WITH DIFFERENTIAL/PLATELET
BASOS PCT: 0 % (ref 0–1)
Basophils Absolute: 0 10*3/uL (ref 0.0–0.1)
EOS ABS: 0.2 10*3/uL (ref 0.0–0.7)
EOS PCT: 3 % (ref 0–5)
HEMATOCRIT: 41.2 % (ref 36.0–46.0)
HEMOGLOBIN: 14.6 g/dL (ref 12.0–15.0)
LYMPHS ABS: 2.4 10*3/uL (ref 0.7–4.0)
Lymphocytes Relative: 43 % (ref 12–46)
MCH: 29.8 pg (ref 26.0–34.0)
MCHC: 35.4 g/dL (ref 30.0–36.0)
MCV: 84.1 fL (ref 78.0–100.0)
MONO ABS: 0.6 10*3/uL (ref 0.1–1.0)
MONOS PCT: 11 % (ref 3–12)
NEUTROS PCT: 43 % (ref 43–77)
Neutro Abs: 2.4 10*3/uL (ref 1.7–7.7)
Platelets: 262 10*3/uL (ref 150–400)
RBC: 4.9 MIL/uL (ref 3.87–5.11)
RDW: 13.8 % (ref 11.5–15.5)
WBC: 5.5 10*3/uL (ref 4.0–10.5)

## 2013-10-01 LAB — LIPID PANEL
CHOL/HDL RATIO: 3.6 ratio
Cholesterol: 182 mg/dL (ref 0–200)
HDL: 50 mg/dL (ref >39)
LDL Cholesterol: 108 mg/dL — ABNORMAL HIGH (ref 0–99)
Triglycerides: 118 mg/dL (ref <150)
VLDL: 24 mg/dL (ref 0–40)

## 2013-10-01 LAB — COMPLETE METABOLIC PANEL WITH GFR
ALBUMIN: 4.4 g/dL (ref 3.5–5.2)
ALK PHOS: 66 U/L (ref 39–117)
ALT: 29 U/L (ref 0–35)
AST: 27 U/L (ref 0–37)
BUN: 22 mg/dL (ref 6–23)
CHLORIDE: 104 meq/L (ref 96–112)
CO2: 27 meq/L (ref 19–32)
Calcium: 9.4 mg/dL (ref 8.4–10.5)
Creat: 0.79 mg/dL (ref 0.50–1.10)
GFR, EST NON AFRICAN AMERICAN: 81 mL/min
GFR, Est African American: >89 mL/min
GLUCOSE: 104 mg/dL — AB (ref 70–99)
POTASSIUM: 4.5 meq/L (ref 3.5–5.3)
SODIUM: 140 meq/L (ref 135–145)
TOTAL PROTEIN: 6.8 g/dL (ref 6.0–8.3)
Total Bilirubin: 0.8 mg/dL (ref 0.2–1.2)

## 2013-10-01 LAB — GLUCOSE, POCT (MANUAL RESULT ENTRY): POC Glucose: 105 mg/dL — AB (ref 70–99)

## 2013-10-01 MED ORDER — AMLODIPINE BESYLATE 2.5 MG PO TABS: 2.5000 mg | ORAL_TABLET | Freq: Every day | ORAL | Status: DC

## 2013-10-01 MED ORDER — PRAVASTATIN SODIUM 40 MG PO TABS: 40.0000 mg | ORAL_TABLET | Freq: Every day | ORAL | Status: DC

## 2013-10-01 MED ORDER — PROPRANOLOL HCL ER 80 MG PO CP24: 80.0000 mg | ORAL_CAPSULE | Freq: Every day | ORAL | Status: DC

## 2013-10-01 NOTE — Progress Notes (Signed)
   Subjective:    Patient ID: Carly Walker, female    DOB: 1951-01-09, 63 y.o.   MRN: [id number]  HPI patient here for followup hypertension. She also has a history of hyperlipidemia. She had surgery for endometrial cancer which was very successful. She also has a history of a essential tremor and is on propranolol for this. She overall feels well and has no specific complaints today she denies chest pain shortness of breath bowel problems. She sees her gynecologist for regular basis    Review of Systems     Objective:   Physical Exam HEENT exam is unremarkable. Her neck is supple. Her chest is clear to auscultation and percussion. Heart regular rate without murmurs        Assessment & Plan:  Blood pressure is at goal. She looks good. Recheck in 1 year. All medications were refilled.

## 2013-10-14 ENCOUNTER — Other Ambulatory Visit: Payer: Self-pay | Admitting: Emergency Medicine

## 2014-04-04 ENCOUNTER — Other Ambulatory Visit: Payer: Self-pay | Admitting: Emergency Medicine

## 2014-09-15 ENCOUNTER — Ambulatory Visit (INDEPENDENT_AMBULATORY_CARE_PROVIDER_SITE_OTHER): Payer: BLUE CROSS/BLUE SHIELD | Admitting: Emergency Medicine

## 2014-09-15 ENCOUNTER — Encounter: Payer: Self-pay | Admitting: Emergency Medicine

## 2014-09-15 VITALS — BP 120/80 | HR 45 | Temp 98.4°F | Resp 16 | Ht 61.0 in | Wt 164.2 lb

## 2014-09-15 DIAGNOSIS — R739 Hyperglycemia, unspecified: Secondary | ICD-10-CM | POA: Diagnosis not present

## 2014-09-15 DIAGNOSIS — R1010 Upper abdominal pain, unspecified: Secondary | ICD-10-CM | POA: Diagnosis not present

## 2014-09-15 DIAGNOSIS — E782 Mixed hyperlipidemia: Secondary | ICD-10-CM

## 2014-09-15 DIAGNOSIS — K625 Hemorrhage of anus and rectum: Secondary | ICD-10-CM

## 2014-09-15 DIAGNOSIS — I10 Essential (primary) hypertension: Secondary | ICD-10-CM | POA: Diagnosis not present

## 2014-09-15 DIAGNOSIS — K6289 Other specified diseases of anus and rectum: Secondary | ICD-10-CM

## 2014-09-15 LAB — CBC WITH DIFFERENTIAL/PLATELET
Basophils Absolute: 0 10*3/uL (ref 0.0–0.1)
Basophils Relative: 0 % (ref 0–1)
EOS ABS: 0.2 10*3/uL (ref 0.0–0.7)
EOS PCT: 4 % (ref 0–5)
HCT: 43.2 % (ref 36.0–46.0)
HEMOGLOBIN: 14.9 g/dL (ref 12.0–15.0)
LYMPHS PCT: 43 % (ref 12–46)
Lymphs Abs: 2.2 10*3/uL (ref 0.7–4.0)
MCH: 30.4 pg (ref 26.0–34.0)
MCHC: 34.5 g/dL (ref 30.0–36.0)
MCV: 88.2 fL (ref 78.0–100.0)
MPV: 9.2 fL (ref 8.6–12.4)
Monocytes Absolute: 0.4 10*3/uL (ref 0.1–1.0)
Monocytes Relative: 7 % (ref 3–12)
Neutro Abs: 2.3 10*3/uL (ref 1.7–7.7)
Neutrophils Relative %: 46 % (ref 43–77)
PLATELETS: 274 10*3/uL (ref 150–400)
RBC: 4.9 MIL/uL (ref 3.87–5.11)
RDW: 13.8 % (ref 11.5–15.5)
WBC: 5.1 10*3/uL (ref 4.0–10.5)

## 2014-09-15 LAB — AMYLASE: Amylase: 42 U/L (ref 0–105)

## 2014-09-15 LAB — COMPLETE METABOLIC PANEL WITH GFR
ALBUMIN: 4.4 g/dL (ref 3.5–5.2)
ALK PHOS: 81 U/L (ref 39–117)
ALT: 33 U/L (ref 0–35)
AST: 28 U/L (ref 0–37)
BILIRUBIN TOTAL: 0.8 mg/dL (ref 0.2–1.2)
BUN: 19 mg/dL (ref 6–23)
CALCIUM: 9.7 mg/dL (ref 8.4–10.5)
CO2: 27 mEq/L (ref 19–32)
Chloride: 104 mEq/L (ref 96–112)
Creat: 0.89 mg/dL (ref 0.50–1.10)
GFR, EST AFRICAN AMERICAN: 80 mL/min
GFR, Est Non African American: 70 mL/min
Glucose, Bld: 99 mg/dL (ref 70–99)
POTASSIUM: 4.4 meq/L (ref 3.5–5.3)
Sodium: 140 mEq/L (ref 135–145)
Total Protein: 7 g/dL (ref 6.0–8.3)

## 2014-09-15 LAB — IFOBT (OCCULT BLOOD): IMMUNOLOGICAL FECAL OCCULT BLOOD TEST: NEGATIVE

## 2014-09-15 LAB — VITAMIN B12: VITAMIN B 12: 833 pg/mL (ref 211–911)

## 2014-09-15 LAB — FERRITIN: Ferritin: 59 ng/mL (ref 10–291)

## 2014-09-15 LAB — LIPASE: Lipase: 26 U/L (ref 0–75)

## 2014-09-15 LAB — MAGNESIUM: Magnesium: 1.9 mg/dL (ref 1.5–2.5)

## 2014-09-15 MED ORDER — AMLODIPINE BESYLATE 5 MG PO TABS: 5.0000 mg | ORAL_TABLET | Freq: Every day | ORAL | Status: DC

## 2014-09-15 NOTE — Patient Instructions (Signed)
Stop your Inderal. I have increased the dosage of your amlodipine. Referrals have been made for an ultrasound of your upper abdomen. Referral has been made to Dr. Carlean Purl because of your rectal bleeding

## 2014-09-15 NOTE — Progress Notes (Signed)
Subjective:  This chart was scribed for Darlyne Russian, MD by Tamsen Roers, at [facility].  This patient was seen in room 21 and the patient's care was started at 9:00 AM.    Patient ID: Carly Walker, female    DOB: [birth year], 64 y.o.   MRN: [id number]  HPI  HPI Comments: Carly Walker is a 64 y.o. female who presents to [facility] for a follow up.  Patient has a history of IBS diagnosed when she was 8 (when she also last had a colonoscopy and had polyps removed).  Her doctor at the time who has now retired told her to get a check up again in 10 years.  Patient is concerned about her IBS flare up and has has been having diarrhea intermittently onset 6 months ago.  She describes it as loose stool 2-3 times a day (with 1-2 episodes of bright red bloody stool) and has associated symptoms of urgency( last had symptoms three weeks ago). 9 days ago, patient had right lower quadrant soreness which she does not have any longer.  Patient notes that for relief, she stopped eating fiber and started eating a very bland diet.  She states the diet helped alleviate her symptoms and she stopped taking her Prilosec. Patient is currently on Amlodipine.  Patient is up to date with her GYN visits once a year as well as her mammograms.  Patient had a checkup with Dr. Risa Grill, her urologist for hematuria.    Blood Pressure: Patient has been keeping track of her blood pressure.  She has been keep up with exercising and checking her blood pressure afterwards.  She is concerned with her blood pressure running too low this week after her work out sessions.    Patient Active Problem List   Diagnosis Date Noted  . Microscopic hematuria 09/04/2012  . Urge incontinence of urine 09/04/2012  . Migraine   . Allergic rhinitis, cause unspecified   . Essential hypertension, benign   . Mixed hyperlipidemia   . History of endometrial cancer   . Tremor, essential   . Ophthalmic  herpes simplex    Past Medical History  Diagnosis Date  . Migraine   . Allergic rhinitis, cause unspecified   . Essential hypertension, benign   . Mixed hyperlipidemia   . History of endometrial cancer 07/2009  . Tremor, essential   . Ophthalmic herpes simplex   . Cancer   . GERD (gastroesophageal reflux disease)    Past Surgical History  Procedure Laterality Date  . Appendectomy    . Hernia repair  03/2010  . Tubal ligation    . Cesarean section    . Repeat cesarean section    . Abdominal hysterectomy     Allergies  Allergen Reactions  . Ace Inhibitors Cough  . Codeine Nausea Only  . Sulfa Antibiotics Hives   Prior to Admission medications   Medication Sig Start Date End Date Taking? Authorizing Provider  acyclovir (ZOVIRAX) 400 MG tablet Take 400 mg by mouth daily. Ophthalmic HSV 1     Historical Provider, MD  amLODipine (NORVASC) 2.5 MG tablet Take 1 tablet (2.5 mg total) by mouth daily. 10/01/13   Darlyne Russian, MD  aspirin 81 MG tablet Take 81 mg by mouth daily.    Historical Provider, MD  B Complex-C (B-COMPLEX WITH VITAMIN C) tablet Take 1 tablet by mouth daily.      Historical Provider, MD  Cholecalciferol (D-3-5)  5000 UNITS capsule Take 5,000 Units by mouth daily.      Historical Provider, MD  Epinastine HCl 0.05 % ophthalmic solution 1 drop 2 (two) times daily.    Historical Provider, MD  Glucosamine-Chondroit-Vit C-Mn (GLUCOSAMINE CHONDR 1500 COMPLX) CAPS Take 1,500 each by mouth daily.    Historical Provider, MD  loteprednol (LOTEMAX) 0.5 % ophthalmic suspension Place 1 drop into the right eye daily.      Historical Provider, MD  MELATONIN PO Take 5 mg by mouth.    Historical Provider, MD  Multiple Vitamin (MULTIVITAMIN) tablet Take 1 tablet by mouth daily. Over the counter, for over 50 individuals( called 50+)     Historical Provider, MD  Omega-3 Fatty Acids (FISH OIL PO) Take 1,400 mg by mouth 2 (two) times daily.      Historical Provider, MD  omeprazole  (PRILOSEC) 20 MG capsule Take 20 mg by mouth daily.    Historical Provider, MD  phentermine 15 MG capsule Take 15 mg by mouth every morning.    Historical Provider, MD  pravastatin (PRAVACHOL) 40 MG tablet Take 1 tablet (40 mg total) by mouth daily. 10/01/13   Darlyne Russian, MD  propranolol ER (INDERAL LA) 80 MG 24 hr capsule Take 1 capsule (80 mg total) by mouth daily. 10/01/13   Darlyne Russian, MD  Red Yeast Rice 600 MG CAPS Take 600 mg by mouth 2 (two) times daily.      Historical Provider, MD  triamcinolone cream (KENALOG) 0.1 % Apply topically 3 (three) times daily. 04/01/13   Shawnee Knapp, MD   History   Social History  . Marital Status: Married    Spouse Name: N/A  . Number of Children: N/A  . Years of Education: N/A   Occupational History  . Not on file.   Social History Main Topics  . Smoking status: Never Smoker   . Smokeless tobacco: Not on file  . Alcohol Use: No  . Drug Use: No  . Sexual Activity: Yes    Birth Control/ Protection: None   Other Topics Concern  . Not on file   Social History Narrative  . No narrative on file    Review of Systems  Constitutional: Negative for fever and chills.  HENT: Negative for drooling and nosebleeds.   Eyes: Negative for redness.  Respiratory: Negative for cough and choking.   Cardiovascular: Negative for chest pain.  Gastrointestinal: Positive for abdominal pain, diarrhea and blood in stool.  Genitourinary: Positive for urgency.  Skin: Negative for color change.       Objective:   Physical Exam CONSTITUTIONAL: Well developed/well nourished HEAD: Normocephalic/atraumatic EYES: EOMI/PERRL ENMT: Mucous membranes moist NECK: supple no meningeal signs SPINE/BACK:entire spine nontender CV: slow heart rate  LUNGS: Lungs are clear to auscultation bilaterally, no apparent distress ABDOMEN: soft, nontender, no rebound or guarding, bowel sounds noted throughout abdomen GU:no cva tenderness NEURO: Pt is awake/alert/appropriate,  moves all extremitiesx4.  No facial droop.   EXTREMITIES: pulses normal/equal, full ROM SKIN: warm, color normal PSYCH: no abnormalities of mood noted, alert and oriented to situation RECTAL:no masses RECTAL/VAGINAL:  no masses, there are no bleeding sights around the anus. Hemosure was obtained.    Filed Vitals:   09/15/14 0854  BP: 120/80  Pulse: 45  Temp: 98.4 F (36.9 C)  TempSrc: Oral  Resp: 16  Height: 5\' 1"  (1.549 m)  Weight: 164 lb 3.2 oz (74.481 kg)  SpO2: 97%   Results for orders placed or performed  in visit on 09/15/14  IFOBT POC (occult bld, rslt in office)  Result Value Ref Range   IFOBT Negative          Assessment & Plan:  I have stopped her Inderal because of her bradycardia. I have increased her amlodipine. Referral made to Dr. Carlean Purl for evaluation of her abdominal pain ultrasound of the abdomen ordered.

## 2014-09-16 ENCOUNTER — Encounter: Payer: Self-pay | Admitting: Nurse Practitioner

## 2014-09-16 LAB — VITAMIN D 25 HYDROXY (VIT D DEFICIENCY, FRACTURES): VIT D 25 HYDROXY: 56 ng/mL (ref 30–100)

## 2014-09-23 ENCOUNTER — Ambulatory Visit
Admission: RE | Admit: 2014-09-23 | Discharge: 2014-09-23 | Disposition: A | Discharge status: Home / Self Care | Payer: BLUE CROSS/BLUE SHIELD | Source: Ambulatory Visit | Attending: Emergency Medicine | Admitting: Emergency Medicine

## 2014-09-23 DIAGNOSIS — R1010 Upper abdominal pain, unspecified: Secondary | ICD-10-CM

## 2014-09-26 ENCOUNTER — Encounter: Payer: Self-pay | Admitting: Nurse Practitioner

## 2014-09-26 ENCOUNTER — Ambulatory Visit (INDEPENDENT_AMBULATORY_CARE_PROVIDER_SITE_OTHER): Payer: BLUE CROSS/BLUE SHIELD | Admitting: Nurse Practitioner

## 2014-09-26 VITALS — BP 122/76 | HR 56 | Ht 61.0 in | Wt 162.2 lb

## 2014-09-26 DIAGNOSIS — K219 Gastro-esophageal reflux disease without esophagitis: Secondary | ICD-10-CM

## 2014-09-26 DIAGNOSIS — K589 Irritable bowel syndrome without diarrhea: Secondary | ICD-10-CM | POA: Diagnosis not present

## 2014-09-26 DIAGNOSIS — K625 Hemorrhage of anus and rectum: Secondary | ICD-10-CM | POA: Diagnosis not present

## 2014-09-26 MED ORDER — MOVIPREP 100 G PO SOLR: 1.0000 | Freq: Once | ORAL | Status: DC

## 2014-09-26 NOTE — Progress Notes (Signed)
HPI :   Patient is a 64 year old female referred by PCP. She gives a history of chronic constipation dating back to childhood. Last summer stools changed, they became loose and urgent. Then, earlier this month patient had a gastroenteritis type illness with nausea, vomiting and severe diarrhea. Since that illness patient has actually reverted back to being constipated with hard stools.  In the last few months she has had 3 episodes of painless rectal bleeding. Blood bright red on the outside of brown stool.  Patient had a colonoscopy by [facility] July 2007. I have the report which shows that a 4 mm sessile polyp was removed from the sigmoid colon. I do not have pathology report.  Patient gives a history chronic GERD. She does not like to take medications, discontinued PPI out of concern for side effects. She has to avoid several types of food now to prevent heartburn. She wants to know our thoughts about long-term reflux medications.  Past Medical History  Diagnosis Date  . Migraine   . Allergic rhinitis, cause unspecified   . Essential hypertension, benign   . Mixed hyperlipidemia   . History of endometrial cancer 07/2009  . Tremor, essential   . Ophthalmic herpes simplex   . GERD (gastroesophageal reflux disease)   . Colon polyps 01/23/2006    type unknown   . HTN (hypertension)   . Hypercholesteremia     Family History  Problem Relation Age of Onset  . Breast cancer Mother   . Emphysema Father   . Heart disease Paternal Grandmother    History  Substance Use Topics  . Smoking status: Never Smoker   . Smokeless tobacco: Not on file  . Alcohol Use: No   Current Outpatient Prescriptions  Medication Sig Dispense Refill  . acyclovir (ZOVIRAX) 400 MG tablet Take 400 mg by mouth daily. Ophthalmic HSV 1     . amLODipine (NORVASC) 5 MG tablet Take 1 tablet (5 mg total) by mouth daily. 90 tablet 3  . aspirin 81 MG tablet Take 81 mg by mouth daily.    . Multiple Vitamin  (MULTIVITAMIN) tablet Take 1 tablet by mouth daily. Over the counter, for over 50 individuals( called 50+)     . pravastatin (PRAVACHOL) 40 MG tablet Take 1 tablet (40 mg total) by mouth daily. 90 tablet 3   No current facility-administered medications for this visit.   Allergies  Allergen Reactions  . Ace Inhibitors Cough  . Codeine Nausea Only  . Sulfa Antibiotics Hives    Review of Systems: Positive for back pain, muscle pain and cramps and sleeping problems.  All other systems reviewed and negative except where noted in HPI.    US Abdomen Complete  09/23/2014   CLINICAL DATA:  Pain in the upper abdomen.  Symptoms for 1 month.  EXAM: ULTRASOUND ABDOMEN COMPLETE  COMPARISON:  Abdominal CT 07/23/2011  FINDINGS: Gallbladder: No gallstones or wall thickening visualized. No sonographic Murphy sign noted.  Common bile duct: Diameter: 3 mm.  Liver: Diffusely increased echogenicity throughout the liver. No focal liver lesion. Findings are suggestive for hepatic steatosis.  IVC: No abnormality visualized.  Pancreas: Visualized portion unremarkable.  Spleen: Size and appearance within normal limits.  Right Kidney: Length: 11.0 cm. Echogenicity within normal limits. No mass or hydronephrosis visualized.  Left Kidney: Length: 10.5 cm. Echogenicity within normal limits. No mass or hydronephrosis visualized.  Abdominal aorta: No aneurysm visualized.  Other findings: None.  IMPRESSION: Hepatic steatosis.  No gallstones.  No  biliary dilatation.   Electronically Signed   By: Markus Daft M.D.   On: 09/23/2014 08:43    Physical Exam: BP 122/76 mmHg  Pulse 56  Ht 5\' 1"  (1.549 m)  Wt 162 lb 3.2 oz (73.573 kg)  BMI 30.66 kg/m2 Constitutional: Pleasant,well-developed, white female in no acute distress. HEENT: Normocephalic and atraumatic. Conjunctivae are normal. No scleral icterus. Neck supple.  Cardiovascular: Normal rate, regular rhythm.  Pulmonary/chest: Effort normal and breath sounds normal. No  wheezing, rales or rhonchi. Abdominal: Soft, nondistended, nontender. Bowel sounds active throughout. There are no masses palpable. No hepatomegaly. Extremities: no edema Lymphadenopathy: No cervical adenopathy noted. Neurological: Alert and oriented to person place and time. Skin: Skin is warm and dry. No rashes noted. Psychiatric: Normal mood and affect. Behavior is normal.   ASSESSMENT AND PLAN:  67. 64 year old female with IBS. She has constipation and loose stool. Most recently constipation has been her issue. Patient consumes a lot of water,  has not tried anything other than oats for constipation. I recommended she try daily Citrucel   2  Rectal bleeding. She has minimally inflamed internal hemorrhoids on anoscopy. Intermittent bleeding probably secondary to hemorrhoids but of course I cannot exclude other etiologies without colonoscopic evaluation. Her last colonoscopy was July 2007. For further evaluation patient will be scheduled for colonoscopy. The risks, benefits, and alternatives to colonoscopy with possible biopsy and possible polypectomy were discussed with the patient and she consents to proceed. Hemorrhoids are minimally inflamed, will hold off on treatment for now and just focus on resolution of constipation  3. GERD, chronic. Patient concerned about consequences of long-term PPI.  We discussed potential for certain infections such as C-diff and potential for fractures. We also discussed risk of untreated reflux (esophagitis / peptic strictures / etc..Marland KitchenShe will restart PPI but try and take it only every other day. If still symptomatic then will consider daily H2 blocker    CC: Arlyss Queen, MD

## 2014-09-26 NOTE — Patient Instructions (Signed)
You have been scheduled for a colonoscopy with Dr Henrene Pastor. Please call us if you have any questions or problems with the prep.

## 2014-09-27 ENCOUNTER — Other Ambulatory Visit: Payer: Self-pay

## 2014-09-27 ENCOUNTER — Encounter: Payer: Self-pay | Admitting: Nurse Practitioner

## 2014-09-27 DIAGNOSIS — K625 Hemorrhage of anus and rectum: Secondary | ICD-10-CM

## 2014-09-28 DIAGNOSIS — K625 Hemorrhage of anus and rectum: Secondary | ICD-10-CM | POA: Insufficient documentation

## 2014-09-28 DIAGNOSIS — K219 Gastro-esophageal reflux disease without esophagitis: Secondary | ICD-10-CM | POA: Insufficient documentation

## 2014-09-28 DIAGNOSIS — K589 Irritable bowel syndrome without diarrhea: Secondary | ICD-10-CM | POA: Insufficient documentation

## 2014-09-28 NOTE — Progress Notes (Signed)
Agree with initial assessment and plans 

## 2014-10-10 ENCOUNTER — Ambulatory Visit (AMBULATORY_SURGERY_CENTER): Payer: BLUE CROSS/BLUE SHIELD | Admitting: Internal Medicine

## 2014-10-10 ENCOUNTER — Encounter: Payer: Self-pay | Admitting: Internal Medicine

## 2014-10-10 VITALS — BP 121/80 | HR 56 | Temp 97.3°F | Resp 18 | Ht 61.0 in | Wt 162.0 lb

## 2014-10-10 DIAGNOSIS — D125 Benign neoplasm of sigmoid colon: Secondary | ICD-10-CM

## 2014-10-10 DIAGNOSIS — D123 Benign neoplasm of transverse colon: Secondary | ICD-10-CM | POA: Diagnosis not present

## 2014-10-10 DIAGNOSIS — K625 Hemorrhage of anus and rectum: Secondary | ICD-10-CM

## 2014-10-10 MED ORDER — SODIUM CHLORIDE 0.9 % IV SOLN: 500.0000 mL | INTRAVENOUS | Status: DC

## 2014-10-10 NOTE — Patient Instructions (Signed)
Discharge instructions given. Handouts on polyps. Resume previous medications. YOU HAD AN ENDOSCOPIC PROCEDURE TODAY AT [facility]:   Refer to the procedure report that was given to you for any specific questions about what was found during the examination.  If the procedure report does not answer your questions, please call your gastroenterologist to clarify.  If you requested that your care partner not be given the details of your procedure findings, then the procedure report has been included in a sealed envelope for you to review at your convenience later.  YOU SHOULD EXPECT: Some feelings of bloating in the abdomen. Passage of more gas than usual.  Walking can help get rid of the air that was put into your GI tract during the procedure and reduce the bloating. If you had a lower endoscopy (such as a colonoscopy or flexible sigmoidoscopy) you may notice spotting of blood in your stool or on the toilet paper. If you underwent a bowel prep for your procedure, you may not have a normal bowel movement for a few days.  Please Note:  You might notice some irritation and congestion in your nose or some drainage.  This is from the oxygen used during your procedure.  There is no need for concern and it should clear up in a day or so.  SYMPTOMS TO REPORT IMMEDIATELY:   Following lower endoscopy (colonoscopy or flexible sigmoidoscopy):  Excessive amounts of blood in the stool  Significant tenderness or worsening of abdominal pains  Swelling of the abdomen that is new, acute  Fever of 100F or higher   For urgent or emergent issues, a gastroenterologist can be reached at any hour by calling [phone].   DIET: Your first meal following the procedure should be a small meal and then it is ok to progress to your normal diet. Heavy or fried foods are harder to digest and may make you feel nauseous or bloated.  Likewise, meals heavy in dairy and vegetables can increase bloating.  Drink  plenty of fluids but you should avoid alcoholic beverages for 24 hours.  ACTIVITY:  You should plan to take it easy for the rest of today and you should NOT DRIVE or use heavy machinery until tomorrow (because of the sedation medicines used during the test).    FOLLOW UP: Our staff will call the number listed on your records the next business day following your procedure to check on you and address any questions or concerns that you may have regarding the information given to you following your procedure. If we do not reach you, we will leave a message.  However, if you are feeling well and you are not experiencing any problems, there is no need to return our call.  We will assume that you have returned to your regular daily activities without incident.  If any biopsies were taken you will be contacted by phone or by letter within the next 1-3 weeks.  Please call us at [phone] if you have not heard about the biopsies in 3 weeks.    SIGNATURES/CONFIDENTIALITY: You and/or your care partner have signed paperwork which will be entered into your electronic medical record.  These signatures attest to the fact that that the information above on your After Visit Summary has been reviewed and is understood.  Full responsibility of the confidentiality of this discharge information lies with you and/or your care-partner.

## 2014-10-10 NOTE — Op Note (Signed)
[place]  [address] [place], [id number]   COLONOSCOPY PROCEDURE REPORT  PATIENT: Carly Walker, Carly Walker  MR#: [id number] BIRTHDATE: Mar 13, 1951 , 63  yrs. old GENDER: female ENDOSCOPIST: Eustace Quail, MD REFERRED [place]:GURKYH Everlene Farrier, M.D. PROCEDURE DATE:  10/10/2014 PROCEDURE:   Colonoscopy, diagnostic and Colonoscopy with snare polypectomy x 2 First Screening Colonoscopy - Avg.  risk and is 50 yrs.  old or older - No.  Prior Negative Screening - Now for repeat screening. N/A  History of Adenoma - Now for follow-up colonoscopy & has been > or = to 3 yrs.  N/A ASA CLASS:   Class I INDICATIONS:Evaluation of unexplained GI bleeding and Patient is not applicable for Colorectal Neoplasm Risk Assessment for this procedure. Previous colonoscopy with Dr. Sammuel Cooper July 2007 diminutive sigmoid colon polyp (? path). MEDICATIONS: Monitored anesthesia care and Propofol 300 mg IV  DESCRIPTION OF PROCEDURE:   After the risks benefits and alternatives of the procedure were thoroughly explained, informed consent was obtained.  The digital rectal exam revealed no abnormalities of the rectum.   The [id number]  endoscope was introduced through the anus and advanced to the cecum, which was identified by both the appendix and ileocecal valve. No adverse events experienced.   The quality of the prep was good.  (MoviPrep was used)  The instrument was then slowly withdrawn as the colon was fully examined.   COLON FINDINGS: Two polyps measuring 5 mm in size were found in the sigmoid colon and transverse colon.  A polypectomy was performed with a cold snare.  The resection was complete, the polyp tissue was completely retrieved and sent to histology.   The examination was otherwise normal.  Retroflexed views revealed no abnormalities. The time to cecum = 3.9 Withdrawal time = 14.0   The scope was withdrawn and the procedure completed. COMPLICATIONS: There were no immediate  complications.  ENDOSCOPIC IMPRESSION: 1.   Two polyps were found in the sigmoid colon and transverse colon; polypectomy was performed with a cold snare 2.   The examination was otherwise normal  RECOMMENDATIONS: 1. Repeat colonoscopy in 5 years if polyp adenomatous; otherwise 10 years  eSigned:  Eustace Quail, MD 10/10/2014 3:56 PM   cc: Arlyss Queen, MD and The Patient

## 2014-10-10 NOTE — Progress Notes (Signed)
Awake alert,  spont resp, pleased with MAC. Report to RN

## 2014-10-10 NOTE — Progress Notes (Signed)
Called to room to assist during endoscopic procedure.  Patient ID and intended procedure confirmed with present staff. Received instructions for my participation in the procedure from the performing physician.  

## 2014-10-11 ENCOUNTER — Telehealth: Payer: Self-pay | Admitting: *Deleted

## 2014-10-11 NOTE — Telephone Encounter (Signed)
  Follow up Call-  Call back number 10/10/2014  Post procedure Call Back phone  # [phone]  Permission to leave phone message Yes     Patient questions:  Do you have a fever, pain , or abdominal swelling? No. Pain Score  0 *  Have you tolerated food without any problems? Yes.    Have you been able to return to your normal activities? Yes.    Do you have any questions about your discharge instructions: Diet   No. Medications  No. Follow up visit  No.  Do you have questions or concerns about your Care? No.  Actions: * If pain score is 4 or above: No action needed, pain <4.

## 2014-10-13 ENCOUNTER — Encounter: Payer: Self-pay | Admitting: Internal Medicine

## 2014-12-02 ENCOUNTER — Encounter: Payer: Self-pay | Admitting: Emergency Medicine

## 2015-01-23 ENCOUNTER — Other Ambulatory Visit: Payer: Self-pay | Admitting: Emergency Medicine

## 2015-01-25 ENCOUNTER — Encounter: Payer: Self-pay | Admitting: Emergency Medicine

## 2015-01-25 ENCOUNTER — Other Ambulatory Visit: Payer: Self-pay | Admitting: Emergency Medicine

## 2015-01-25 MED ORDER — PRAVASTATIN SODIUM 40 MG PO TABS: 40.0000 mg | ORAL_TABLET | Freq: Every day | ORAL | Status: DC

## 2015-04-01 ENCOUNTER — Ambulatory Visit (INDEPENDENT_AMBULATORY_CARE_PROVIDER_SITE_OTHER): Payer: BLUE CROSS/BLUE SHIELD | Admitting: Emergency Medicine

## 2015-04-01 ENCOUNTER — Ambulatory Visit (INDEPENDENT_AMBULATORY_CARE_PROVIDER_SITE_OTHER): Payer: BLUE CROSS/BLUE SHIELD

## 2015-04-01 VITALS — BP 122/84 | HR 70 | Temp 97.9°F | Resp 16 | Ht 61.5 in | Wt 160.2 lb

## 2015-04-01 DIAGNOSIS — M25532 Pain in left wrist: Secondary | ICD-10-CM | POA: Diagnosis not present

## 2015-04-01 DIAGNOSIS — Z23 Encounter for immunization: Secondary | ICD-10-CM | POA: Diagnosis not present

## 2015-04-01 MED ORDER — HYDROCODONE-ACETAMINOPHEN 5-325 MG PO TABS: 1.0000 | ORAL_TABLET | ORAL | Status: DC | PRN

## 2015-04-01 NOTE — Progress Notes (Signed)
Subjective:  Patient ID: Carly Walker, female    DOB: Jun 07, 1951  Age: 64 y.o. MRN: [id number]  CC: Arm Injury and Immunizations   HPI Carly Walker presents   Patient tripped over her dog and landed on a chair.  Her arm outstretched she landed on the ulnar aspect of wrist been able to control her pain largely by elevation. She has pain with movement movement flexion-extension or deviation of wrist. She has no swelling or ecchymosis.  History Carly Walker has a past medical history of Migraine; Allergic rhinitis, cause unspecified; Essential hypertension, benign; Mixed hyperlipidemia; History of endometrial cancer (07/2009); Tremor, essential; Ophthalmic herpes simplex; GERD (gastroesophageal reflux disease); Colon polyps (01/23/2006); HTN (hypertension); Hypercholesteremia; Allergy; Arthritis; and Cancer.   She has past surgical history that includes Appendectomy; Hernia repair (03/2010); Tubal ligation; Cesarean section; Repeat cesarean section; Abdominal hysterectomy; and CYST REMOVED FROM R WRIST.   Her  family history includes Breast cancer in her mother; Diabetes in her brother and father; Emphysema in her father; Heart disease in her brother, father, and paternal grandmother.  She   reports that she has never smoked. She has never used smokeless tobacco. She reports that she does not drink alcohol or use illicit drugs.  Outpatient Prescriptions Prior to Visit  Medication Sig Dispense Refill  . acyclovir (ZOVIRAX) 400 MG tablet Take 400 mg by mouth daily. Ophthalmic HSV 1     . amLODipine (NORVASC) 5 MG tablet Take 1 tablet (5 mg total) by mouth daily. 90 tablet 3  . aspirin 81 MG tablet Take 81 mg by mouth daily.    . Multiple Vitamin (MULTIVITAMIN) tablet Take 1 tablet by mouth daily. Over the counter, for over 50 individuals( called 50+)     . pravastatin (PRAVACHOL) 40 MG tablet Take 1 tablet (40 mg total) by mouth daily. 90 tablet 3   No facility-administered medications prior  to visit.    Social History   Social History  . Marital Status: Married    Spouse Name: N/A  . Number of Children: N/A  . Years of Education: N/A   Social History Main Topics  . Smoking status: Never Smoker   . Smokeless tobacco: Never Used  . Alcohol Use: No  . Drug Use: No  . Sexual Activity: Yes    Birth Control/ Protection: None   Other Topics Concern  . None   Social History Narrative     Review of Systems  Constitutional: Negative for fever, chills and appetite change.  HENT: Negative for congestion, ear pain, postnasal drip, sinus pressure and sore throat.   Eyes: Negative for pain and redness.  Respiratory: Negative for cough, shortness of breath and wheezing.   Cardiovascular: Negative for leg swelling.  Gastrointestinal: Negative for nausea, vomiting, abdominal pain, diarrhea, constipation and blood in stool.  Endocrine: Negative for polyuria.  Genitourinary: Negative for dysuria, urgency, frequency and flank pain.  Musculoskeletal: Negative for gait problem.  Skin: Negative for rash.  Neurological: Negative for weakness and headaches.  Psychiatric/Behavioral: Negative for confusion and decreased concentration. The patient is not nervous/anxious.     Objective:  BP 122/84 mmHg  Pulse 70  Temp(Src) 97.9 F (36.6 C) (Oral)  Resp 16  Ht 5' 1.5" (1.562 m)  Wt 160 lb 3.2 oz (72.666 kg)  BMI 29.78 kg/m2  SpO2 98%  Physical Exam  Constitutional: She is oriented to person, place, and time. She appears well-developed and well-nourished.  HENT:  Head: Normocephalic and atraumatic.  Eyes: Conjunctivae  are normal. Pupils are equal, round, and reactive to light.  Pulmonary/Chest: Effort normal.  Musculoskeletal: She exhibits no edema.       Left wrist: She exhibits decreased range of motion and tenderness. She exhibits no swelling and no deformity.  Neurological: She is alert and oriented to person, place, and time.  Skin: Skin is dry.  Psychiatric: She has  a normal mood and affect. Her behavior is normal. Thought content normal.      Assessment & Plan:   Carly Walker was seen today for arm injury and immunizations.  Diagnoses and all orders for this visit:  Left wrist pain -     DG Wrist Complete Left; Future  Need for immunization against influenza -     Flu Vaccine QUAD 36+ mos IM (Fluarix)   I am having Ms. Holle maintain her acyclovir, multivitamin, aspirin, amLODipine, pravastatin, and omeprazole.  Meds ordered this encounter  Medications  . omeprazole (PRILOSEC) 20 MG capsule    Sig: Take 20 mg by mouth daily.    Appropriate red flag conditions were discussed with the patient as well as actions that should be taken.  Patient expressed his understanding.  Follow-up: No Follow-up on file.  Roselee Culver, MD   [facility] reading (PRIMARY) by  Dr. Ouida Sills.  Negative .

## 2015-04-01 NOTE — Patient Instructions (Signed)

## 2015-04-11 ENCOUNTER — Encounter: Payer: Self-pay | Admitting: Emergency Medicine

## 2015-07-28 ENCOUNTER — Ambulatory Visit (INDEPENDENT_AMBULATORY_CARE_PROVIDER_SITE_OTHER): Payer: BLUE CROSS/BLUE SHIELD

## 2015-07-28 ENCOUNTER — Ambulatory Visit (INDEPENDENT_AMBULATORY_CARE_PROVIDER_SITE_OTHER): Payer: BLUE CROSS/BLUE SHIELD | Admitting: Family Medicine

## 2015-07-28 VITALS — BP 140/86 | HR 80 | Temp 97.9°F | Resp 16 | Ht 61.5 in | Wt 171.2 lb

## 2015-07-28 DIAGNOSIS — M25532 Pain in left wrist: Secondary | ICD-10-CM

## 2015-07-28 DIAGNOSIS — S52502A Unspecified fracture of the lower end of left radius, initial encounter for closed fracture: Secondary | ICD-10-CM

## 2015-07-28 DIAGNOSIS — W102XXA Fall (on)(from) incline, initial encounter: Secondary | ICD-10-CM

## 2015-07-28 MED ORDER — IBUPROFEN 200 MG PO TABS
600.0000 mg | ORAL_TABLET | Freq: Once | ORAL | Status: AC
  Administered 2015-07-28: 600 mg via ORAL

## 2015-07-28 NOTE — Progress Notes (Signed)
Sugar tong splint and sling applied to the left arm

## 2015-07-28 NOTE — Progress Notes (Signed)
Patient ID: [name], female    DOB: [birth year]  Age: 65 y.o. MRN: [id number]  Chief Complaint  Patient presents with  . 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 She slipped on a bank and fell impacting her left wrist. She has a little pain in the elbow but the main pain and abnormality is obviously in the wrist. She went home and change closing came.   Current allergies, medications, problem list, past/family and social histories reviewed.  Objective:  BP 140/86 mmHg  Pulse 80  Temp(Src) 97.9 F (36.6 C) (Oral)  Resp 16  Ht 5' 1.5" (1.562 m)  Wt 171 lb 3.2 oz (77.656 kg)  BMI 31.83 kg/m2  SpO2 97%  Deformed left wrist, swollen. Being held on ice at this time. Neurovascular intact.  Assessment & Plan:   Assessment: 1. Fracture of left distal radius, closed, initial encounter   2. Left wrist pain   3. Fall (on)(from) incline, initial encounter       Plan: We'll x-ray wrist  Orders Placed This Encounter  Procedures  . DG Wrist Complete Left    Order Specific Question:  Reason for Exam (SYMPTOM  OR DIAGNOSIS REQUIRED)    Answer:  left wrist injury    Order Specific Question:  Preferred imaging location?    Answer:  External   [facility] reading (PRIMARY) by  Dr. Linna Darner Comminuted fracture left distal radius involving joint, impacted and slightly angulated. Hulen Skains and spoke to Dr. Lorin Mercy office. She has been to him in the past. We will send her straight over there and he is going to see her today, which we appreciate late on a Friday afternoon. Meds ordered this encounter  Medications  . ibuprofen (ADVIL,MOTRIN) tablet 600 mg    Sig:          Patient Instructions  Go straight to see Dr. Lorin Mercy.  Return as needed.     No Follow-up on file.   HOPPER,DAVID, MD 07/28/2015

## 2015-07-28 NOTE — Patient Instructions (Signed)
Go straight to see Dr. Lorin Mercy.  Return as needed.

## 2015-07-31 HISTORY — PX: WRIST FRACTURE SURGERY: SHX121

## 2015-09-21 ENCOUNTER — Other Ambulatory Visit: Payer: Self-pay | Admitting: Emergency Medicine

## 2015-12-08 ENCOUNTER — Other Ambulatory Visit: Payer: Self-pay | Admitting: Emergency Medicine

## 2015-12-08 ENCOUNTER — Telehealth: Payer: Self-pay

## 2015-12-08 ENCOUNTER — Ambulatory Visit (INDEPENDENT_AMBULATORY_CARE_PROVIDER_SITE_OTHER): Payer: BLUE CROSS/BLUE SHIELD | Admitting: Emergency Medicine

## 2015-12-08 VITALS — BP 132/88 | HR 68 | Temp 98.0°F | Resp 15 | Ht 61.5 in | Wt 171.4 lb

## 2015-12-08 DIAGNOSIS — Z1159 Encounter for screening for other viral diseases: Secondary | ICD-10-CM | POA: Diagnosis not present

## 2015-12-08 DIAGNOSIS — R229 Localized swelling, mass and lump, unspecified: Secondary | ICD-10-CM

## 2015-12-08 DIAGNOSIS — R739 Hyperglycemia, unspecified: Secondary | ICD-10-CM | POA: Diagnosis not present

## 2015-12-08 DIAGNOSIS — I10 Essential (primary) hypertension: Secondary | ICD-10-CM

## 2015-12-08 DIAGNOSIS — E782 Mixed hyperlipidemia: Secondary | ICD-10-CM | POA: Diagnosis not present

## 2015-12-08 DIAGNOSIS — R222 Localized swelling, mass and lump, trunk: Secondary | ICD-10-CM

## 2015-12-08 DIAGNOSIS — Z1321 Encounter for screening for nutritional disorder: Secondary | ICD-10-CM

## 2015-12-08 LAB — LIPID PANEL
CHOLESTEROL: 189 mg/dL (ref 125–200)
HDL: 53 mg/dL (ref >=46)
LDL Cholesterol: 103 mg/dL (ref <130)
Total CHOL/HDL Ratio: 3.6 Ratio (ref <=5.0)
Triglycerides: 167 mg/dL — ABNORMAL HIGH (ref <150)
VLDL: 33 mg/dL — ABNORMAL HIGH (ref <30)

## 2015-12-08 LAB — POCT CBC
GRANULOCYTE PERCENT: 57.3 % (ref 37–80)
HEMATOCRIT: 44.3 % (ref 37.7–47.9)
HEMOGLOBIN: 15.9 g/dL (ref 12.2–16.2)
LYMPH, POC: 2.3 (ref 0.6–3.4)
MCH, POC: 31.1 pg (ref 27–31.2)
MCHC: 35.9 g/dL — AB (ref 31.8–35.4)
MCV: 86.6 fL (ref 80–97)
MID (CBC): 0.8 (ref 0–0.9)
MPV: 6.5 fL (ref 0–99.8)
POC GRANULOCYTE: 4.1 (ref 2–6.9)
POC LYMPH %: 32 % (ref 10–50)
POC MID %: 10.7 %M (ref 0–12)
Platelet Count, POC: 272 10*3/uL (ref 142–424)
RBC: 5.11 M/uL (ref 4.04–5.48)
RDW, POC: 13.5 %
WBC: 7.1 10*3/uL (ref 4.6–10.2)

## 2015-12-08 LAB — COMPLETE METABOLIC PANEL WITH GFR
ALBUMIN: 4.6 g/dL (ref 3.6–5.1)
ALT: 58 U/L — ABNORMAL HIGH (ref 6–29)
AST: 51 U/L — ABNORMAL HIGH (ref 10–35)
Alkaline Phosphatase: 95 U/L (ref 33–130)
BUN: 17 mg/dL (ref 7–25)
CALCIUM: 9.7 mg/dL (ref 8.6–10.4)
CHLORIDE: 101 mmol/L (ref 98–110)
CO2: 25 mmol/L (ref 20–31)
Creat: 0.82 mg/dL (ref 0.50–0.99)
GFR, Est African American: 88 mL/min (ref >=60)
GFR, Est Non African American: 76 mL/min (ref >=60)
GLUCOSE: 102 mg/dL — AB (ref 65–99)
POTASSIUM: 4.6 mmol/L (ref 3.5–5.3)
SODIUM: 143 mmol/L (ref 135–146)
TOTAL PROTEIN: 7.2 g/dL (ref 6.1–8.1)
Total Bilirubin: 0.7 mg/dL (ref 0.2–1.2)

## 2015-12-08 LAB — TSH: TSH: 1.75 m[IU]/L

## 2015-12-08 LAB — HEMOGLOBIN A1C
Hgb A1c MFr Bld: 6.3 % — ABNORMAL HIGH (ref <5.7)
Mean Plasma Glucose: 134 mg/dL

## 2015-12-08 LAB — HEPATITIS C ANTIBODY: HCV AB: NEGATIVE

## 2015-12-08 MED ORDER — AMLODIPINE BESYLATE 10 MG PO TABS: ORAL_TABLET | ORAL | Status: DC

## 2015-12-08 MED ORDER — PRAVASTATIN SODIUM 40 MG PO TABS: 40.0000 mg | ORAL_TABLET | Freq: Every day | ORAL | Status: DC

## 2015-12-08 NOTE — Telephone Encounter (Signed)
Pt would like 90 prescription for Norvasc. Please Advise

## 2015-12-08 NOTE — Telephone Encounter (Signed)
Pt states she was seen today and they changed her norvasc rx and it was written for 30 day supply not 90 days   Best number [phone]

## 2015-12-08 NOTE — Progress Notes (Addendum)
Patient ID: Carly Walker, female   DOB: 09/28/1950, 65 y.o.   MRN: [id number]    By signing my name below, I, Essence Howell, attest that this documentation has been prepared under the direction and in the presence of Darlyne Russian, MD Electronically Signed: Ladene Artist, ED Scribe 12/08/2015 at 11:01 AM  Chief Complaint:  Chief Complaint  Patient presents with  . Follow-up    Concerning medication levels   . Annual Exam    Pt needs blood work    HPI: Carly Walker is a 65 y.o. female, with a h/o HTN, hypercholesteremia, endometrial CA, who reports to [facility] today for a follow-up regarding elevated BP. Pt states that her BP has intermittently been elevated for the past few months. She had left wrist surgery in January with elevated BP and again yesterday with a systolic BP in the [id number] after doing yard work. Triage BP today was 132/88. Repeat BP: 162/88. She denies side effects of lower extremity swelling with amlodipine.   Back Nodule Pt recently noticed a tender nodule to her lower back a few days ago. Pt reports increased tenderness with palpation. She states that the area feels larger some days than others. No treatments tried PTA.   Gynecology  Pt sees her gynecologist Vania Rea, MD regularly. She recently had a normal mammogram at [facility]. Pt reports a h/o endometrial CA.   Colonoscopy  Pt had a normal colonoscopy last year.   Past Medical History  Diagnosis Date  . Migraine   . Allergic rhinitis, cause unspecified   . Essential hypertension, benign   . Mixed hyperlipidemia   . History of endometrial cancer 07/2009  . Tremor, essential   . Ophthalmic herpes simplex   . GERD (gastroesophageal reflux disease)   . Colon polyps 01/23/2006    type unknown   . HTN (hypertension)   . Hypercholesteremia   . Allergy     SEASONAL  . Arthritis     LOWER BACK  . Cancer [facility])     ENDOMETRIAL CANCER   Past Surgical History  Procedure Laterality Date  . Appendectomy    . Hernia  repair  03/2010  . Tubal ligation    . Cesarean section    . Repeat cesarean section    . Abdominal hysterectomy    . Cyst removed from r wrist     Social History   Social History  . Marital Status: Married    Spouse Name: N/A  . Number of Children: N/A  . Years of Education: N/A   Social History Main Topics  . Smoking status: Never Smoker   . Smokeless tobacco: Never Used  . Alcohol Use: No  . Drug Use: No  . Sexual Activity: Yes    Birth Control/ Protection: None   Other Topics Concern  . None   Social History Narrative   Family History  Problem Relation Age of Onset  . Breast cancer Mother   . Emphysema Father   . Diabetes Father   . Heart disease Father   . Heart disease Paternal Grandmother   . Diabetes Brother   . Heart disease Brother    Allergies  Allergen Reactions  . Ace Inhibitors Cough  . Codeine Nausea Only  . Hydrocodone   . Sulfa Antibiotics Hives   Prior to Admission medications   Medication Sig Start Date End Date Taking? Authorizing Provider  acyclovir (ZOVIRAX) 400 MG tablet Take 400 mg by mouth daily. Ophthalmic HSV 1  Yes Historical Provider, MD  amLODipine (NORVASC) 5 MG tablet TAKE 1 TABLET BY MOUTH EVERY DAY 09/22/15  Yes Darlyne Russian, MD  aspirin 81 MG tablet Take 81 mg by mouth daily.   Yes Historical Provider, MD  Multiple Vitamin (MULTIVITAMIN) tablet Take 1 tablet by mouth daily. Over the counter, for over 50 individuals( called 50+)    Yes Historical Provider, MD  omeprazole (PRILOSEC) 20 MG capsule Take 20 mg by mouth daily.   Yes Historical Provider, MD  pravastatin (PRAVACHOL) 40 MG tablet Take 1 tablet (40 mg total) by mouth daily. 01/25/15  Yes Darlyne Russian, MD  HYDROcodone-acetaminophen (NORCO) 5-325 MG per tablet Take 1-2 tablets by mouth every 4 (four) hours as needed. Patient not taking: Reported on 12/08/2015 04/01/15   Roselee Culver, MD   ROS: The patient denies fevers, chills, night sweats, unintentional weight  loss, chest pain, palpitations, wheezing, dyspnea on exertion, nausea, vomiting, abdominal pain, dysuria, hematuria, melena, numbness, weakness, or tingling.   All other systems have been reviewed and were otherwise negative with the exception of those mentioned in the HPI and as above.    PHYSICAL EXAM: Filed Vitals:   12/08/15 0937  BP: 132/88  Pulse: 68  Temp: 98 F (36.7 C)  Resp: 15   Body mass index is 31.87 kg/(m^2).  General: Alert, no acute distress HEENT:  Normocephalic, atraumatic, oropharynx patent. Eye: Juliette Mangle [facility] Cardiovascular:  Regular rate and rhythm, no rubs murmurs or gallops. No Carotid bruits, radial pulse intact. No pedal edema.  Respiratory: Clear to auscultation bilaterally. No wheezes, rales, or rhonchi. No cyanosis, no use of accessory musculature Abdominal: No organomegaly, abdomen is soft and non-tender, positive bowel sounds. No masses. Musculoskeletal: Gait intact. No edema, tenderness Skin: No rashes. 1x2 cm tender subcutaneous nodule lateral mid L back.  Neurologic: Facial musculature symmetric. Psychiatric: Patient acts appropriately throughout our interaction. Lymphatic: No cervical or submandibular lymphadenopathy  LABS: Results for orders placed or performed in visit on 12/08/15  POCT CBC  Result Value Ref Range   WBC 7.1 4.6 - 10.2 K/uL   Lymph, poc 2.3 0.6 - 3.4   POC LYMPH PERCENT 32.0 10 - 50 %L   MID (cbc) 0.8 0 - 0.9   POC MID % 10.7 0 - 12 %M   POC Granulocyte 4.1 2 - 6.9   Granulocyte percent 57.3 37 - 80 %G   RBC 5.11 4.04 - 5.48 M/uL   Hemoglobin 15.9 12.2 - 16.2 g/dL   HCT, POC 44.3 37.7 - 47.9 %   MCV 86.6 80 - 97 fL   MCH, POC 31.1 27 - 31.2 pg   MCHC 35.9 (A) 31.8 - 35.4 g/dL   RDW, POC 13.5 %   Platelet Count, POC 272 142 - 424 K/uL   MPV 6.5 0 - 99.8 fL   EKG/XRAY:   Primary read interpreted by Dr. Everlene Farrier at [facility].EKG sinus bradycardia no evidence of block no LVH  ASSESSMENT/PLAN:  We'll increase Norvasc to 10 mg  a day. We'll see if she can tolerate this. She was alerted to the problem with swelling. Follow-up blood pressure 4-6 weeks.I personally performed the services described in this documentation, which was scribed in my presence. The recorded information has been reviewed and is accurate. Gross sideeffects, risk and benefits, and alternatives of medications d/w patient. Patient is aware that all medications have potential sideeffects and we are unable to predict every sideeffect or drug-drug interaction that may occur.  Arlyss Queen  MD 12/08/2015 10:16 AM

## 2015-12-08 NOTE — Addendum Note (Signed)
Addended by: Arlyss Queen A on: 12/08/2015 03:44 PM   Modules accepted: Miquel Dunn

## 2015-12-08 NOTE — Telephone Encounter (Signed)
Call patient  I  changed it to 90 days.

## 2015-12-08 NOTE — Patient Instructions (Addendum)
Recheck 4-6 weeks to check on your blood pressure.    IF you received an x-ray today, you will receive an invoice from [facility] Radiology. Please contact [facility] Radiology at [phone] with questions or concerns regarding your invoice.   IF you received labwork today, you will receive an invoice from [employer]. Please contact [facility] at [phone] with questions or concerns regarding your invoice.   Our billing staff will not be able to assist you with questions regarding bills from these companies.  You will be contacted with the lab results as soon as they are available. The fastest way to get your results is to activate your My Chart account. Instructions are located on the last page of this paperwork. If you have not heard from [place] regarding the results in 2 weeks, please contact this office.     DASH Eating Plan DASH stands for "Dietary Approaches to Stop Hypertension." The DASH eating plan is a healthy eating plan that has been shown to reduce high blood pressure (hypertension). Additional health benefits may include reducing the risk of type 2 diabetes mellitus, heart disease, and stroke. The DASH eating plan may also help with weight loss. WHAT DO I NEED TO KNOW ABOUT THE DASH EATING PLAN? For the DASH eating plan, you will follow these general guidelines:  Choose foods with a percent daily value for sodium of less than 5% (as listed on the food label).  Use salt-free seasonings or herbs instead of table salt or sea salt.  Check with your health care provider or pharmacist before using salt substitutes.  Eat lower-sodium products, often labeled as "lower sodium" or "no salt added."  Eat fresh foods.  Eat more vegetables, fruits, and low-fat dairy products.  Choose whole grains. Look for the word "whole" as the first word in the ingredient list.  Choose fish and skinless chicken or [place] more often than red meat. Limit fish, poultry, and  meat to 6 oz (170 g) each day.  Limit sweets, desserts, sugars, and sugary drinks.  Choose heart-healthy fats.  Limit cheese to 1 oz (28 g) per day.  Eat more home-cooked food and less restaurant, buffet, and fast food.  Limit fried foods.  Cook foods using methods other than frying.  Limit canned vegetables. If you do use them, rinse them well to decrease the sodium.  When eating at a restaurant, ask that your food be prepared with less salt, or no salt if possible. WHAT FOODS CAN I EAT? Seek help from a dietitian for individual calorie needs. Grains Whole grain or whole wheat bread. Brown rice. Whole grain or whole wheat pasta. Quinoa, bulgur, and whole grain cereals. Low-sodium cereals. Corn or whole wheat flour tortillas. Whole grain cornbread. Whole grain crackers. Low-sodium crackers. Vegetables Fresh or frozen vegetables (raw, steamed, roasted, or grilled). Low-sodium or reduced-sodium tomato and vegetable juices. Low-sodium or reduced-sodium tomato sauce and paste. Low-sodium or reduced-sodium canned vegetables.  Fruits All fresh, canned (in natural juice), or frozen fruits. Meat and Other Protein Products Ground beef (85% or leaner), grass-fed beef, or beef trimmed of fat. Skinless chicken or [place]. Ground chicken or [place]. Pork trimmed of fat. All fish and seafood. Eggs. Dried beans, peas, or lentils. Unsalted nuts and seeds. Unsalted canned beans. Dairy Low-fat dairy products, such as skim or 1% milk, 2% or reduced-fat cheeses, low-fat ricotta or cottage cheese, or plain low-fat yogurt. Low-sodium or reduced-sodium cheeses. Fats and Oils Tub margarines without trans fats. Light or reduced-fat mayonnaise and  salad dressings (reduced sodium). Avocado. Safflower, olive, or canola oils. Natural peanut or almond butter. Other Unsalted popcorn and pretzels. The items listed above may not be a complete list of recommended foods or beverages. Contact your dietitian for more  options. WHAT FOODS ARE NOT RECOMMENDED? Grains White bread. White pasta. White rice. Refined cornbread. Bagels and croissants. Crackers that contain trans fat. Vegetables Creamed or fried vegetables. Vegetables in a cheese sauce. Regular canned vegetables. Regular canned tomato sauce and paste. Regular tomato and vegetable juices. Fruits Dried fruits. Canned fruit in light or heavy syrup. Fruit juice. Meat and Other Protein Products Fatty cuts of meat. Ribs, chicken wings, bacon, sausage, bologna, salami, chitterlings, fatback, hot dogs, bratwurst, and packaged luncheon meats. Salted nuts and seeds. Canned beans with salt. Dairy Whole or 2% milk, cream, half-and-half, and cream cheese. Whole-fat or sweetened yogurt. Full-fat cheeses or blue cheese. Nondairy creamers and whipped toppings. Processed cheese, cheese spreads, or cheese curds. Condiments Onion and garlic salt, seasoned salt, table salt, and sea salt. Canned and packaged gravies. Worcestershire sauce. Tartar sauce. Barbecue sauce. Teriyaki sauce. Soy sauce, including reduced sodium. Steak sauce. Fish sauce. Oyster sauce. Cocktail sauce. Horseradish. Ketchup and mustard. Meat flavorings and tenderizers. Bouillon cubes. Hot sauce. Tabasco sauce. Marinades. Taco seasonings. Relishes. Fats and Oils Butter, stick margarine, lard, shortening, ghee, and bacon fat. Coconut, palm kernel, or palm oils. Regular salad dressings. Other Pickles and olives. Salted popcorn and pretzels. The items listed above may not be a complete list of foods and beverages to avoid. Contact your dietitian for more information. WHERE CAN I FIND MORE INFORMATION? National Heart, Lung, and Blood Institute: travelstabloid.com   This information is not intended to replace advice given to you by your health care provider. Make sure you discuss any questions you have with your health care provider.   Document Released: 06/13/2011  Document Revised: 07/15/2014 Document Reviewed: 04/28/2013 Elsevier Interactive Patient Education [employer].

## 2015-12-08 NOTE — Telephone Encounter (Signed)
Spoke with pt and informed her of the change.

## 2015-12-09 LAB — VITAMIN D 25 HYDROXY (VIT D DEFICIENCY, FRACTURES): VIT D 25 HYDROXY: 35 ng/mL (ref 30–100)

## 2015-12-17 ENCOUNTER — Other Ambulatory Visit: Payer: Self-pay | Admitting: Emergency Medicine

## 2015-12-18 ENCOUNTER — Ambulatory Visit (INDEPENDENT_AMBULATORY_CARE_PROVIDER_SITE_OTHER): Payer: BLUE CROSS/BLUE SHIELD | Admitting: Emergency Medicine

## 2015-12-18 VITALS — BP 122/70 | HR 65 | Temp 97.7°F | Resp 17 | Ht 61.5 in | Wt 172.0 lb

## 2015-12-18 DIAGNOSIS — R739 Hyperglycemia, unspecified: Secondary | ICD-10-CM

## 2015-12-18 DIAGNOSIS — I10 Essential (primary) hypertension: Secondary | ICD-10-CM | POA: Diagnosis not present

## 2015-12-18 DIAGNOSIS — R7989 Other specified abnormal findings of blood chemistry: Secondary | ICD-10-CM

## 2015-12-18 DIAGNOSIS — K760 Fatty (change of) liver, not elsewhere classified: Secondary | ICD-10-CM

## 2015-12-18 DIAGNOSIS — E782 Mixed hyperlipidemia: Secondary | ICD-10-CM | POA: Diagnosis not present

## 2015-12-18 DIAGNOSIS — R945 Abnormal results of liver function studies: Principal | ICD-10-CM

## 2015-12-18 LAB — FERRITIN: Ferritin: 31 ng/mL (ref 20–288)

## 2015-12-18 NOTE — Patient Instructions (Addendum)
Please recheck in 2 months. I will call you with results of your other testing.    IF you received an x-ray today, you will receive an invoice from [facility] Radiology. Please contact [facility] Radiology at [phone] with questions or concerns regarding your invoice.   IF you received labwork today, you will receive an invoice from [employer]. Please contact [facility] at [phone] with questions or concerns regarding your invoice.   Our billing staff will not be able to assist you with questions regarding bills from these companies.  You will be contacted with the lab results as soon as they are available. The fastest way to get your results is to activate your My Chart account. Instructions are located on the last page of this paperwork. If you have not heard from [place] regarding the results in 2 weeks, please contact this office.       We recommend that you schedule a mammogram for breast cancer screening. Typically, you do not need a referral to do this. Please contact a local imaging center to schedule your mammogram.  [facility] - [phone]  *ask for the Radiology Department [facility] - [phone] or [phone]  [facility] - [phone] [place] [phone] [facility] - [phone]  *ask for the [facility] - [phone]  *ask for the Radiology Department [facility] - [phone]  *ask for the [place] - [phone]

## 2015-12-18 NOTE — Progress Notes (Signed)
Subjective:  This chart was scribed for Carly Queen MD, by Carly Walker, at [facility].  This patient was seen in room 2 and the patient's care was started at 11:52 AM.   Chief Complaint  Patient presents with  . Follow-up    test results     Patient ID: Carly Walker, female    DOB: Jul 26, 1950, 65 y.o.   MRN: [id number]  HPI HPI Comments: Carly Walker is a 65 y.o. female who presents to the [facility] for a follow up regarding her test results. Her last A1C was 6.3.  Her liver function tests were elevated with an AST at 52 and ALT of 58 and a negative Hep C test.   Patient was having microscopic blood in her urine in the past which she is being followed for.   Patient has a history of IBS, endometrial cancer (2011) and hypertension. Patient also had an eye infection in 2011 (official herpetic eye infection).  She has had a hysterectomy and states that afterwards, they were not able to see any cancer.  They think that they removed the cancer with the initial biopsy which was taken.  Patient is willing to start working on her diet in order to get her A1C level in the right direction. She was trying to eat healthy recently (beans, fish, slaw) but states that her IBS "kicked in" suddenly.    Ultrasound of the abdomen that was done March 2016 which showed fatty change in the liver.   colonoscopy showed that patient had an adenomatous polyp and was told to follow up in 5 years. Mammogram done may of this year- repeat in 1 year.   Patient Active Problem List   Diagnosis Date Noted  . Esophageal reflux 09/28/2014  . Rectal bleeding 09/28/2014  . IBS (irritable colon syndrome) 09/28/2014  . Microscopic hematuria 09/04/2012  . Urge incontinence of urine 09/04/2012  . Migraine   . Allergic rhinitis, cause unspecified   . Essential hypertension, benign   . Mixed hyperlipidemia   . History of endometrial cancer   . Tremor, essential   .  Ophthalmic herpes simplex    Past Medical History  Diagnosis Date  . Migraine   . Allergic rhinitis, cause unspecified   . Essential hypertension, benign   . Mixed hyperlipidemia   . History of endometrial cancer 07/2009  . Tremor, essential   . Ophthalmic herpes simplex   . GERD (gastroesophageal reflux disease)   . Colon polyps 01/23/2006    type unknown   . HTN (hypertension)   . Hypercholesteremia   . Allergy     SEASONAL  . Arthritis     LOWER BACK  . Cancer [facility])     ENDOMETRIAL CANCER   Past Surgical History  Procedure Laterality Date  . Appendectomy    . Hernia repair  03/2010  . Tubal ligation    . Cesarean section    . Repeat cesarean section    . Abdominal hysterectomy    . Cyst removed from r wrist     Allergies  Allergen Reactions  . Ace Inhibitors Cough  . Codeine Nausea Only  . Hydrocodone   . Sulfa Antibiotics Hives   Prior to Admission medications   Medication Sig Start Date End Date Taking? Authorizing Provider  acyclovir (ZOVIRAX) 400 MG tablet Take 400 mg by mouth daily. Ophthalmic HSV 1    Yes Historical Provider, MD  amLODipine (NORVASC) 10 MG  tablet Take 1 tablet daily 12/08/15  Yes Carly Russian, MD  aspirin 81 MG tablet Take 81 mg by mouth daily.   Yes Historical Provider, MD  Multiple Vitamin (MULTIVITAMIN) tablet Take 1 tablet by mouth daily. Over the counter, for over 50 individuals( called 50+)    Yes Historical Provider, MD  omeprazole (PRILOSEC) 20 MG capsule Take 20 mg by mouth daily.   Yes Historical Provider, MD  pravastatin (PRAVACHOL) 40 MG tablet Take 1 tablet (40 mg total) by mouth daily. 12/08/15  Yes Carly Russian, MD   Social History   Social History  . Marital Status: Married    Spouse Name: N/A  . Number of Children: N/A  . Years of Education: N/A   Occupational History  . Not on file.   Social History Main Topics  . Smoking status: Never Smoker   . Smokeless tobacco: Never Used  . Alcohol Use: No  . Drug Use: No    . Sexual Activity: Yes    Birth Control/ Protection: None   Other Topics Concern  . Not on file   Social History Narrative    Review of Systems  Constitutional: Negative for fever and chills.  Eyes: Negative for pain, redness and itching.  Respiratory: Negative for cough, choking and shortness of breath.   Gastrointestinal: Negative for nausea and vomiting.  Musculoskeletal: Negative for neck pain and neck stiffness.  Neurological: Negative for syncope and speech difficulty.       Objective:   Physical Exam Filed Vitals:   12/18/15 0946  BP: 122/70  Pulse: 65  Temp: 97.7 F (36.5 C)  TempSrc: Oral  Resp: 17  Height: 5' 1.5" (1.562 m)  Weight: 172 lb (78.019 kg)  SpO2: 97%     CONSTITUTIONAL: Well developed/well nourished HEAD: Normocephalic/atraumatic EYES: EOMI/PERRL ENMT: Mucous membranes moist NECK: supple no meningeal signs SPINE/BACK:entire spine nontender CV: S1/S2 noted, no murmurs/rubs/gallops noted LUNGS: Lungs are clear to auscultation bilaterally, no apparent distress ABDOMEN: soft, nontender, no rebound or guarding, bowel sounds noted throughout abdomen GU:no cva tenderness NEURO: Pt is awake/alert/appropriate, moves all extremitiesx4.  No facial droop.   EXTREMITIES: pulses normal/equal, full ROM SKIN: warm, color normal PSYCH: no abnormalities of mood noted, alert and oriented to situation     Assessment & Plan:  More extensive liver function screening was done. I will see the patient back in 2 months. She had an ultrasound last year which did not show any abnormalities. She does have a history of endometrial cancer but it appeared to be very superficial and contained within the uterine lining. If LFTs remain elevated despite a low-fat diet and treating her high sugar will see to the liver. She will return to clinic in 2 months.I personally performed the services described in this documentation, which was scribed in my presence. The recorded  information has been reviewed and is accurate.

## 2015-12-19 LAB — IRON AND TIBC
%SAT: 25 % (ref 11–50)
Iron: 97 ug/dL (ref 45–160)
TIBC: 394 ug/dL (ref 250–450)
UIBC: 297 ug/dL (ref 125–400)

## 2015-12-19 LAB — MITOCHONDRIAL ANTIBODIES

## 2015-12-19 LAB — CK: Total CK: 107 U/L (ref 7–177)

## 2015-12-19 LAB — HEPATITIS B SURFACE ANTIGEN: Hepatitis B Surface Ag: NEGATIVE

## 2015-12-19 LAB — ANA: ANA: NEGATIVE

## 2015-12-22 LAB — CERULOPLASMIN: CERULOPLASMIN: 30 mg/dL (ref 18–53)

## 2015-12-22 LAB — ALPHA-1-ANTITRYPSIN: A-1 Antitrypsin, Ser: 118 mg/dL (ref 83–199)

## 2016-02-05 ENCOUNTER — Telehealth: Payer: Self-pay

## 2016-02-05 NOTE — Telephone Encounter (Signed)
Pt is needing to talk with someone about her medicaitons and did not give the names  Best number [phone]

## 2016-02-06 NOTE — Telephone Encounter (Signed)
Spoke with pt, she wants to ask Dr. Everlene Farrier to go back to 5mg  of Amlodipine because she is having some swelling now.

## 2016-02-07 NOTE — Telephone Encounter (Signed)
Pt advised.

## 2016-02-07 NOTE — Telephone Encounter (Signed)
Okay to change back to 5 mg amlodipine Please send in that prescription.

## 2016-05-20 ENCOUNTER — Encounter: Payer: Self-pay | Admitting: Emergency Medicine

## 2016-10-22 ENCOUNTER — Ambulatory Visit (INDEPENDENT_AMBULATORY_CARE_PROVIDER_SITE_OTHER): Payer: BLUE CROSS/BLUE SHIELD | Admitting: Physician Assistant

## 2016-10-22 ENCOUNTER — Encounter: Payer: Self-pay | Admitting: Physician Assistant

## 2016-10-22 VITALS — BP 148/94 | HR 72 | Temp 98.0°F | Resp 16 | Ht 61.0 in | Wt 175.0 lb

## 2016-10-22 DIAGNOSIS — R3129 Other microscopic hematuria: Secondary | ICD-10-CM | POA: Diagnosis not present

## 2016-10-22 DIAGNOSIS — H04123 Dry eye syndrome of bilateral lacrimal glands: Secondary | ICD-10-CM

## 2016-10-22 DIAGNOSIS — Z7689 Persons encountering health services in other specified circumstances: Secondary | ICD-10-CM | POA: Diagnosis not present

## 2016-10-22 DIAGNOSIS — I10 Essential (primary) hypertension: Secondary | ICD-10-CM

## 2016-10-22 DIAGNOSIS — B0050 Herpesviral ocular disease, unspecified: Secondary | ICD-10-CM | POA: Diagnosis not present

## 2016-10-22 DIAGNOSIS — E782 Mixed hyperlipidemia: Secondary | ICD-10-CM

## 2016-10-22 DIAGNOSIS — K582 Mixed irritable bowel syndrome: Secondary | ICD-10-CM

## 2016-10-22 MED ORDER — AMLODIPINE BESYLATE 10 MG PO TABS: ORAL_TABLET | ORAL | 3 refills | Status: DC

## 2016-10-22 MED ORDER — PRAVASTATIN SODIUM 40 MG PO TABS
40.0000 mg | ORAL_TABLET | Freq: Every day | ORAL | 3 refills | Status: DC
Start: 2016-10-22 — End: 2017-06-24

## 2016-10-22 MED ORDER — HYDROCHLOROTHIAZIDE 12.5 MG PO CAPS: 12.5000 mg | ORAL_CAPSULE | Freq: Every day | ORAL | 3 refills | Status: DC

## 2016-10-22 NOTE — Assessment & Plan Note (Signed)
Continue efforts for lifestyle changes that do no exacerbate IBS.

## 2016-10-22 NOTE — Assessment & Plan Note (Signed)
Follow-up with Dr. Risa Grill in June, as planned. May need nephrology evaluation.

## 2016-10-22 NOTE — Progress Notes (Signed)
Patient ID: Carly Walker, female    DOB: 01-18-1951, 66 y.o.   MRN: [id number]  PCP: Harrison Mons, PA-C, previously followed by Dr. Everlene Farrier.  Chief Complaint  Patient presents with  . Hypertension    having trouble regulating high BP    Subjective:   Presents for evaluation of HTN.  Dr. Everlene Farrier advised her to check it several times/week. Home readings 170's/80's. She tried not to worry about it, but finds that she's been having more swelling in the lower legs. She uses diabetic socks, and finds that they leave a mark on her leg where they squeeze. Intolerant to ACEI (caused cough). Has been on Benicar, and stopped it when the amlodipine was increased, to simplify her regimen. Tolerates amlodipine.  Recently had to increase the reading glasses prescription. No other vision changes. Has had increased dry eye symptoms and her eye specialist recommended that she get checked for Sjogren's. No CP, SOB, HA, dizziness.  LEFT ankle stiffness after sitting for a while. Eases off with walking.  Making some healthy lifestyle changes, focusing mostly on her diet. Trying to reduce carbs and fatty meats, tending toward a vegetarian diet.  Review of Systems  Constitutional: Negative.   HENT: Negative.   Eyes: Negative.   Respiratory: Negative.   Cardiovascular: Positive for leg swelling. Negative for chest pain and palpitations.  Gastrointestinal: Negative.   Endocrine: Negative.   Genitourinary: Negative.   Musculoskeletal: Positive for arthralgias (stiffness of ankle). Negative for back pain, gait problem, joint swelling, myalgias, neck pain and neck stiffness.  Allergic/Immunologic: Positive for environmental allergies. Negative for food allergies and immunocompromised state.  Neurological: Negative.   Hematological: Negative.   Psychiatric/Behavioral: Negative.        Patient Active Problem List   Diagnosis Date Noted  . Esophageal reflux 09/28/2014  . Rectal bleeding  09/28/2014  . IBS (irritable colon syndrome) 09/28/2014  . Microscopic hematuria 09/04/2012  . Urge incontinence of urine 09/04/2012  . Migraine   . Allergic rhinitis, cause unspecified   . Essential hypertension, benign   . Mixed hyperlipidemia   . History of endometrial cancer   . Tremor, essential   . Ophthalmic herpes simplex      Prior to Admission medications   Medication Sig Start Date End Date Taking? Authorizing Provider  acyclovir (ZOVIRAX) 400 MG tablet Take 400 mg by mouth daily. Ophthalmic HSV 1    Yes Historical Provider, MD  amLODipine (NORVASC) 10 MG tablet Take 1 tablet daily 12/08/15  Yes Darlyne Russian, MD  aspirin 81 MG tablet Take 81 mg by mouth daily.   Yes Historical Provider, MD  Multiple Vitamin (MULTIVITAMIN) tablet Take 1 tablet by mouth daily. Over the counter, for over 50 individuals( called 50+)    Yes Historical Provider, MD  pravastatin (PRAVACHOL) 40 MG tablet Take 1 tablet (40 mg total) by mouth daily. 12/08/15  Yes Darlyne Russian, MD     Allergies  Allergen Reactions  . Ace Inhibitors Cough  . Codeine Nausea Only  . Hydrocodone   . Sulfa Antibiotics Hives       Objective:  Physical Exam  Constitutional: She is oriented to person, place, and time. She appears well-developed and well-nourished. She is active and cooperative. No distress.  BP (!) 148/94   Pulse 72   Temp 98 F (36.7 C) (Oral)   Resp 16   Ht 5\' 1"  (1.549 m)   Wt 175 lb (79.4 kg)   SpO2 96%  BMI 33.07 kg/m   HENT:  Head: Normocephalic and atraumatic.  Right Ear: Hearing normal.  Left Ear: Hearing normal.  Eyes: Conjunctivae are normal. No scleral icterus.  Neck: Normal range of motion. Neck supple. No thyromegaly present.  Cardiovascular: Normal rate, regular rhythm and normal heart sounds.   Pulses:      Radial pulses are 2+ on the right side, and 2+ on the left side.  Pulmonary/Chest: Effort normal and breath sounds normal.  Lymphadenopathy:       Head (right side):  No tonsillar, no preauricular, no posterior auricular and no occipital adenopathy present.       Head (left side): No tonsillar, no preauricular, no posterior auricular and no occipital adenopathy present.    She has no cervical adenopathy.       Right: No supraclavicular adenopathy present.       Left: No supraclavicular adenopathy present.  Neurological: She is alert and oriented to person, place, and time. No sensory deficit.  Skin: Skin is warm, dry and intact. No rash noted. No cyanosis or erythema. Nails show no clubbing.  Psychiatric: She has a normal mood and affect. Her speech is normal and behavior is normal.           Assessment & Plan:   Problem List Items Addressed This Visit    Microscopic hematuria (Chronic)    Follow-up with Dr. Risa Grill in June, as planned. May need nephrology evaluation.      Essential hypertension, benign - Primary    Uncontrolled. Add HCTZ. If intolerant to the diuretic (she experiences urge incontinence), would switch to ARB.      Relevant Medications   pravastatin (PRAVACHOL) 40 MG tablet   amLODipine (NORVASC) 10 MG tablet   hydrochlorothiazide (MICROZIDE) 12.5 MG capsule   Other Relevant Orders   CBC with Differential/Platelet   Comprehensive metabolic panel   T4, free   TSH   Care order/instruction: (Completed)   Mixed hyperlipidemia    Await labs. Adjust regimen as indicated by results.      Relevant Medications   pravastatin (PRAVACHOL) 40 MG tablet   amLODipine (NORVASC) 10 MG tablet   hydrochlorothiazide (MICROZIDE) 12.5 MG capsule   Other Relevant Orders   Lipid panel   Ophthalmic herpes simplex    Ophthalmologist recommended evaluation for Sjogren's.      IBS (irritable colon syndrome)    Continue efforts for lifestyle changes that do no exacerbate IBS.       Other Visit Diagnoses    Dry eye syndrome of both eyes       Evaluate for autoimmune etiology.   Relevant Orders   Sjogren's syndrome antibods(ssa + ssb)    Rheumatoid factor   ANA   Sedimentation rate   Encounter to establish care           Return in about 4 weeks (around 11/19/2016).   Fara Chute, PA-C [facility]

## 2016-10-22 NOTE — Patient Instructions (Signed)
     IF you received an x-ray today, you will receive an invoice from [facility]. Please contact [facility] at [phone] with questions or concerns regarding your invoice.   IF you received labwork today, you will receive an invoice from [facility]. Please contact [facility] at [phone] with questions or concerns regarding your invoice.   Our billing staff will not be able to assist you with questions regarding bills from these companies.  You will be contacted with the lab results as soon as they are available. The fastest way to get your results is to activate your My Chart account. Instructions are located on the last page of this paperwork. If you have not heard from us regarding the results in 2 weeks, please contact this office.     

## 2016-10-22 NOTE — Assessment & Plan Note (Signed)
Ophthalmologist recommended evaluation for Sjogren's.

## 2016-10-22 NOTE — Assessment & Plan Note (Addendum)
Uncontrolled. Add HCTZ. If intolerant to the diuretic (she experiences urge incontinence), would switch to ARB.

## 2016-10-22 NOTE — Assessment & Plan Note (Signed)
Await labs. Adjust regimen as indicated by results.  

## 2016-10-23 LAB — COMPREHENSIVE METABOLIC PANEL
ALT: 86 IU/L — AB (ref 0–32)
AST: 73 IU/L — AB (ref 0–40)
Albumin/Globulin Ratio: 2.1 (ref 1.2–2.2)
Albumin: 4.9 g/dL — ABNORMAL HIGH (ref 3.6–4.8)
Alkaline Phosphatase: 96 IU/L (ref 39–117)
BUN/Creatinine Ratio: 13 (ref 12–28)
BUN: 10 mg/dL (ref 8–27)
Bilirubin Total: 0.6 mg/dL (ref 0.0–1.2)
CALCIUM: 9.7 mg/dL (ref 8.7–10.3)
CHLORIDE: 97 mmol/L (ref 96–106)
CO2: 25 mmol/L (ref 18–29)
Creatinine, Ser: 0.79 mg/dL (ref 0.57–1.00)
GFR, EST AFRICAN AMERICAN: 91 mL/min/{1.73_m2} (ref >59)
GFR, EST NON AFRICAN AMERICAN: 79 mL/min/{1.73_m2} (ref >59)
GLUCOSE: 95 mg/dL (ref 65–99)
Globulin, Total: 2.3 g/dL (ref 1.5–4.5)
POTASSIUM: 3.9 mmol/L (ref 3.5–5.2)
Sodium: 141 mmol/L (ref 134–144)
TOTAL PROTEIN: 7.2 g/dL (ref 6.0–8.5)

## 2016-10-23 LAB — LIPID PANEL
CHOL/HDL RATIO: 3.5 ratio (ref 0.0–4.4)
Cholesterol, Total: 189 mg/dL (ref 100–199)
HDL: 54 mg/dL (ref >39)
LDL Calculated: 105 mg/dL — ABNORMAL HIGH (ref 0–99)
TRIGLYCERIDES: 151 mg/dL — AB (ref 0–149)
VLDL CHOLESTEROL CAL: 30 mg/dL (ref 5–40)

## 2016-10-23 LAB — CBC WITH DIFFERENTIAL/PLATELET
BASOS ABS: 0 10*3/uL (ref 0.0–0.2)
Basos: 0 %
EOS (ABSOLUTE): 0.2 10*3/uL (ref 0.0–0.4)
Eos: 3 %
Hematocrit: 43.9 % (ref 34.0–46.6)
Hemoglobin: 15.1 g/dL (ref 11.1–15.9)
IMMATURE GRANS (ABS): 0 10*3/uL (ref 0.0–0.1)
IMMATURE GRANULOCYTES: 0 %
LYMPHS: 38 %
Lymphocytes Absolute: 2.9 10*3/uL (ref 0.7–3.1)
MCH: 30.6 pg (ref 26.6–33.0)
MCHC: 34.4 g/dL (ref 31.5–35.7)
MCV: 89 fL (ref 79–97)
MONOS ABS: 0.5 10*3/uL (ref 0.1–0.9)
Monocytes: 7 %
NEUTROS PCT: 52 %
Neutrophils Absolute: 3.9 10*3/uL (ref 1.4–7.0)
PLATELETS: 297 10*3/uL (ref 150–379)
RBC: 4.93 x10E6/uL (ref 3.77–5.28)
RDW: 13.9 % (ref 12.3–15.4)
WBC: 7.6 10*3/uL (ref 3.4–10.8)

## 2016-10-23 LAB — T4, FREE: FREE T4: 1.11 ng/dL (ref 0.82–1.77)

## 2016-10-23 LAB — SJOGREN'S SYNDROME ANTIBODS(SSA + SSB)
ENA SSA (RO) Ab: <0.2 AI (ref 0.0–0.9)
ENA SSB (LA) Ab: <0.2 AI (ref 0.0–0.9)

## 2016-10-23 LAB — RHEUMATOID FACTOR

## 2016-10-23 LAB — ANA: Anti Nuclear Antibody(ANA): NEGATIVE

## 2016-10-23 LAB — SEDIMENTATION RATE: SED RATE: 4 mm/h (ref 0–40)

## 2016-10-23 LAB — TSH: TSH: 2.22 u[IU]/mL (ref 0.450–4.500)

## 2016-10-30 ENCOUNTER — Encounter: Payer: Self-pay | Admitting: Physician Assistant

## 2016-10-30 DIAGNOSIS — K760 Fatty (change of) liver, not elsewhere classified: Secondary | ICD-10-CM | POA: Insufficient documentation

## 2016-11-15 ENCOUNTER — Encounter: Payer: Self-pay | Admitting: Physician Assistant

## 2016-11-15 ENCOUNTER — Ambulatory Visit (INDEPENDENT_AMBULATORY_CARE_PROVIDER_SITE_OTHER): Payer: BLUE CROSS/BLUE SHIELD | Admitting: Physician Assistant

## 2016-11-15 VITALS — BP 133/82 | HR 79 | Temp 98.0°F | Resp 18 | Ht 61.0 in | Wt 175.0 lb

## 2016-11-15 DIAGNOSIS — B9789 Other viral agents as the cause of diseases classified elsewhere: Secondary | ICD-10-CM

## 2016-11-15 DIAGNOSIS — J069 Acute upper respiratory infection, unspecified: Secondary | ICD-10-CM

## 2016-11-15 DIAGNOSIS — J301 Allergic rhinitis due to pollen: Secondary | ICD-10-CM | POA: Diagnosis not present

## 2016-11-15 MED ORDER — AZELASTINE HCL 0.1 % NA SOLN: 2.0000 | Freq: Two times a day (BID) | NASAL | 0 refills | Status: DC

## 2016-11-15 MED ORDER — GUAIFENESIN ER 1200 MG PO TB12: 1.0000 | ORAL_TABLET | Freq: Two times a day (BID) | ORAL | 1 refills | Status: DC | PRN

## 2016-11-15 MED ORDER — BENZONATATE 100 MG PO CAPS: 100.0000 mg | ORAL_CAPSULE | Freq: Three times a day (TID) | ORAL | 0 refills | Status: DC | PRN

## 2016-11-15 NOTE — Patient Instructions (Addendum)
Get plenty of rest and drink at least 64 ounces of water daily.    IF you received an x-ray today, you will receive an invoice from [facility]. Please contact [facility] at [phone] with questions or concerns regarding your invoice.   IF you received labwork today, you will receive an invoice from [facility]. Please contact [facility] at [phone] with questions or concerns regarding your invoice.   Our billing staff will not be able to assist you with questions regarding bills from these companies.  You will be contacted with the lab results as soon as they are available. The fastest way to get your results is to activate your My Chart account. Instructions are located on the last page of this paperwork. If you have not heard from us regarding the results in 2 weeks, please contact this office.      

## 2016-11-15 NOTE — Progress Notes (Signed)
Patient ID: Carly Walker, female    DOB: March 03, 1951, 66 y.o.   MRN: [id number]  PCP: Harrison Mons, PA-C  Chief Complaint  Patient presents with  . Cough    x3-4 days;green/yellow sputum  . chest congestion    states she can't seem to get rid of it  . Headache    states the headache is from all the coughing    Subjective:   Presents for evaluation of 3-4 days of cough and congestion.  I saw her husband for similar symptoms yesterday. His symptoms began 7-10 days ago and included facial pain, dizziness.  Sore throat. Gargling with salt water. Laryngitis. Has so much drainage that she gags. No fever, chills. No other nausea/vomiting. No diarrhea. Headache when coughing, around her eyes. Itching ears this morning.  Review of Systems As above.    Patient Active Problem List   Diagnosis Date Noted  . Fatty liver 10/30/2016  . Esophageal reflux 09/28/2014  . Rectal bleeding 09/28/2014  . IBS (irritable colon syndrome) 09/28/2014  . Microscopic hematuria 09/04/2012  . Urge incontinence of urine 09/04/2012  . Migraine   . Allergic rhinitis, cause unspecified   . Essential hypertension, benign   . Mixed hyperlipidemia   . History of endometrial cancer   . Tremor, essential   . Ophthalmic herpes simplex      Prior to Admission medications   Medication Sig Start Date End Date Taking? Authorizing Provider  acyclovir (ZOVIRAX) 400 MG tablet Take 400 mg by mouth daily. Ophthalmic HSV 1    Yes [provider]  amLODipine (NORVASC) 10 MG tablet Take 1 tablet daily 10/22/16  Yes Teka Chanda, PA-C  aspirin 81 MG tablet Take 81 mg by mouth daily.   Yes [provider]  hydrochlorothiazide (MICROZIDE) 12.5 MG capsule Take 1 capsule (12.5 mg total) by mouth daily. 10/22/16  Yes Dennie Moltz, PA-C  Multiple Vitamin (MULTIVITAMIN) tablet Take 1 tablet by mouth daily. Over the counter, for over 50 individuals( called 50+)    Yes [provider]  omeprazole (PRILOSEC OTC) 20 MG tablet Take 20 mg by mouth daily.   Yes [provider]  pravastatin (PRAVACHOL) 40 MG tablet Take 1 tablet (40 mg total) by mouth daily. 10/22/16  Yes Harrison Mons, PA-C     Allergies  Allergen Reactions  . Ace Inhibitors Cough  . Codeine Nausea Only  . Hydrocodone   . Sulfa Antibiotics Hives       Objective:  Physical Exam  Constitutional: She is oriented to person, place, and time. She appears well-developed and well-nourished. No distress.  BP 133/82   Pulse 79   Temp 98 F (36.7 C) (Oral)   Resp 18   Ht 5\' 1"  (1.549 m)   Wt 175 lb (79.4 kg)   SpO2 96%   BMI 33.07 kg/m    HENT:  Head: Normocephalic and atraumatic.  Right Ear: Hearing, tympanic membrane, external ear and ear canal normal.  Left Ear: Hearing, tympanic membrane, external ear and ear canal normal.  Nose: Mucosal edema and rhinorrhea present.  No foreign bodies. Right sinus exhibits no maxillary sinus tenderness and no frontal sinus tenderness. Left sinus exhibits no maxillary sinus tenderness and no frontal sinus tenderness.  Mouth/Throat: Uvula is midline, oropharynx is clear and moist and mucous membranes are normal. No uvula swelling. No oropharyngeal exudate.  Eyes: Conjunctivae and EOM are normal. Pupils are equal, round, and reactive to light. Right eye exhibits no discharge.  Left eye exhibits no discharge. No scleral icterus.  Neck: Trachea normal, normal range of motion and full passive range of motion without pain. Neck supple. No thyroid mass and no thyromegaly present.  Cardiovascular: Normal rate, regular rhythm and normal heart sounds.   Pulmonary/Chest: Effort normal and breath sounds normal.  Lymphadenopathy:       Head (right side): No submandibular, no tonsillar, no preauricular, no posterior auricular and no occipital adenopathy present.       Head (left side): No submandibular, no tonsillar, no preauricular and no occipital adenopathy present.      She has no cervical adenopathy.       Right: No supraclavicular adenopathy present.       Left: No supraclavicular adenopathy present.  Neurological: She is alert and oriented to person, place, and time. She has normal strength. No cranial nerve deficit or sensory deficit.  Skin: Skin is warm, dry and intact. No rash noted.  Psychiatric: She has a normal mood and affect. Her speech is normal and behavior is normal.           Assessment & Plan:   1. Seasonal allergic rhinitis due to pollen 2. Viral URI with cough Supportive care.  Anticipatory guidance.  RTC if symptoms worsen/persist. - azelastine (ASTELIN) 0.1 % nasal spray; Place 2 sprays into both nostrils 2 (two) times daily. Use in each nostril as directed  Dispense: 30 mL; Refill: 0 - benzonatate (TESSALON) 100 MG capsule; Take 1-2 capsules (100-200 mg total) by mouth 3 (three) times daily as needed for cough.  Dispense: 40 capsule; Refill: 0 - Guaifenesin (MUCINEX MAXIMUM STRENGTH) 1200 MG TB12; Take 1 tablet (1,200 mg total) by mouth every 12 (twelve) hours as needed.  Dispense: 14 tablet; Refill: 1    Return in 4 days (on 11/19/2016). For previously scheduled visit.   Fara Chute, PA-C [facility]

## 2016-11-19 ENCOUNTER — Encounter: Payer: Self-pay | Admitting: Physician Assistant

## 2016-11-19 ENCOUNTER — Other Ambulatory Visit: Payer: Self-pay | Admitting: Physician Assistant

## 2016-11-19 ENCOUNTER — Ambulatory Visit (INDEPENDENT_AMBULATORY_CARE_PROVIDER_SITE_OTHER): Payer: BLUE CROSS/BLUE SHIELD | Admitting: Physician Assistant

## 2016-11-19 VITALS — BP 119/76 | HR 71 | Temp 98.6°F | Resp 18 | Ht 61.5 in | Wt 175.6 lb

## 2016-11-19 DIAGNOSIS — J019 Acute sinusitis, unspecified: Secondary | ICD-10-CM | POA: Diagnosis not present

## 2016-11-19 DIAGNOSIS — R945 Abnormal results of liver function studies: Secondary | ICD-10-CM

## 2016-11-19 DIAGNOSIS — I10 Essential (primary) hypertension: Secondary | ICD-10-CM

## 2016-11-19 DIAGNOSIS — B9789 Other viral agents as the cause of diseases classified elsewhere: Principal | ICD-10-CM

## 2016-11-19 DIAGNOSIS — R7989 Other specified abnormal findings of blood chemistry: Secondary | ICD-10-CM | POA: Diagnosis not present

## 2016-11-19 DIAGNOSIS — Z6832 Body mass index (BMI) 32.0-32.9, adult: Secondary | ICD-10-CM | POA: Insufficient documentation

## 2016-11-19 DIAGNOSIS — E782 Mixed hyperlipidemia: Secondary | ICD-10-CM

## 2016-11-19 DIAGNOSIS — J069 Acute upper respiratory infection, unspecified: Secondary | ICD-10-CM

## 2016-11-19 DIAGNOSIS — R809 Proteinuria, unspecified: Secondary | ICD-10-CM | POA: Insufficient documentation

## 2016-11-19 DIAGNOSIS — Z114 Encounter for screening for human immunodeficiency virus [HIV]: Secondary | ICD-10-CM

## 2016-11-19 MED ORDER — HYDROCOD POLST-CPM POLST ER 10-8 MG/5ML PO SUER: 5.0000 mL | Freq: Two times a day (BID) | ORAL | 0 refills | Status: DC | PRN

## 2016-11-19 MED ORDER — AMOXICILLIN-POT CLAVULANATE 875-125 MG PO TABS: 1.0000 | ORAL_TABLET | Freq: Two times a day (BID) | ORAL | 0 refills | Status: AC

## 2016-11-19 NOTE — Progress Notes (Signed)
Patient ID: Carly Walker, female    DOB: 1951-04-07, 66 y.o.   MRN: [id number]  PCP: Harrison Mons, PA-C  Chief Complaint  Patient presents with  . Follow-up    Hypertension, Allergies    Subjective:   Presents for evaluation of HTN and allergies. Also recheck of recent allergy exacerbation.  She is tolerating treatment well, but notes that her acute sinus symptoms and cough are not improving. She now has pain and pressure in the teeth, behind the LEFT eye and in the LEFT ear. She is also easily fatigued.  She brings a food diary, which she writes in intermittently. Working on reducing carbs and increasing vegetables. Often eats vegetarian, and eats more carbs then. Cheese is one of her favorites, and she is trying to cut back. Finds herself eating on the go, and "whatever is fast," rather than preparing meals.   Review of Systems As above. No chest pain, SOB, HA, dizziness, vision change, N/V, diarrhea, constipation, dysuria, urinary urgency or frequency, myalgias, arthralgias or rash.   Patient Active Problem List   Diagnosis Date Noted  . Proteinuria 11/19/2016  . Fatty liver 10/30/2016  . Esophageal reflux 09/28/2014  . Rectal bleeding 09/28/2014  . IBS (irritable colon syndrome) 09/28/2014  . Microscopic hematuria 09/04/2012  . Urge incontinence of urine 09/04/2012  . Migraine   . Allergic rhinitis   . Essential hypertension, benign   . Mixed hyperlipidemia   . History of endometrial cancer   . Tremor, essential   . Ophthalmic herpes simplex      Prior to Admission medications   Medication Sig Start Date End Date Taking? Authorizing Provider  acyclovir (ZOVIRAX) 400 MG tablet Take 400 mg by mouth daily. Ophthalmic HSV 1    Yes [provider]  amLODipine (NORVASC) 10 MG tablet Take 1 tablet daily 10/22/16  Yes Chidera Thivierge, PA-C  aspirin 81 MG tablet Take 81 mg by mouth daily.   Yes [provider]  azelastine (ASTELIN) 0.1 %  nasal spray Place 2 sprays into both nostrils 2 (two) times daily. Use in each nostril as directed 11/15/16  Yes Chelcy Bolda, PA-C  benzonatate (TESSALON) 100 MG capsule Take 1-2 capsules (100-200 mg total) by mouth 3 (three) times daily as needed for cough. 11/15/16  Yes Mikiah Demond, PA-C  Guaifenesin (MUCINEX MAXIMUM STRENGTH) 1200 MG TB12 Take 1 tablet (1,200 mg total) by mouth every 12 (twelve) hours as needed. 11/15/16  Yes Ruchel Brandenburger, PA-C  hydrochlorothiazide (MICROZIDE) 12.5 MG capsule Take 1 capsule (12.5 mg total) by mouth daily. 10/22/16  Yes Lenaya Pietsch, PA-C  Multiple Vitamin (MULTIVITAMIN) tablet Take 1 tablet by mouth daily. Over the counter, for over 50 individuals( called 50+)    Yes [provider]  omeprazole (PRILOSEC OTC) 20 MG tablet Take 20 mg by mouth daily.   Yes [provider]  pravastatin (PRAVACHOL) 40 MG tablet Take 1 tablet (40 mg total) by mouth daily. 10/22/16  Yes Harrison Mons, PA-C     Allergies  Allergen Reactions  . Ace Inhibitors Cough  . Codeine Nausea Only  . Hydrocodone   . Sulfa Antibiotics Hives       Objective:  Physical Exam  Constitutional: She is oriented to person, place, and time. She appears well-developed and well-nourished. She is active and cooperative. No distress.  BP 119/76   Pulse 71   Temp 98.6 F (37 C) (Oral)   Resp 18   Ht 5' 1.5" (1.562 m)  Wt 175 lb 9.6 oz (79.7 kg)   SpO2 94%   BMI 32.64 kg/m   HENT:  Head: Normocephalic and atraumatic.  Right Ear: Hearing, tympanic membrane, external ear and ear canal normal.  Left Ear: Hearing, tympanic membrane, external ear and ear canal normal.  Nose: Mucosal edema (mild) present. Right sinus exhibits maxillary sinus tenderness and frontal sinus tenderness. Left sinus exhibits maxillary sinus tenderness and frontal sinus tenderness.  Mouth/Throat: Uvula is midline, oropharynx is clear and moist and mucous membranes are normal. No oral lesions.    Eyes: Conjunctivae are normal. No scleral icterus.  Neck: Normal range of motion. Neck supple. No thyromegaly present.  Cardiovascular: Normal rate, regular rhythm and normal heart sounds.   Pulses:      Radial pulses are 2+ on the right side, and 2+ on the left side.  Pulmonary/Chest: Effort normal and breath sounds normal.  Lymphadenopathy:       Head (right side): No tonsillar, no preauricular, no posterior auricular and no occipital adenopathy present.       Head (left side): No tonsillar, no preauricular, no posterior auricular and no occipital adenopathy present.    She has no cervical adenopathy.       Right: No supraclavicular adenopathy present.       Left: No supraclavicular adenopathy present.  Neurological: She is alert and oriented to person, place, and time. No sensory deficit.  Skin: Skin is warm, dry and intact. No rash noted. No cyanosis or erythema. Nails show no clubbing.  Psychiatric: She has a normal mood and affect. Her speech is normal and behavior is normal.           Assessment & Plan:   Problem List Items Addressed This Visit    Essential hypertension, benign - Primary    Controlled on amlodipine 10 and HCTZ 12.5.      Relevant Orders   Care order/instruction: (Completed)   Mixed hyperlipidemia    Update CMET today. Continue healthy lifestyle changes.      BMI 32.0-32.9,adult    Ready for help. Refer to [facility].      Relevant Orders   Amb Ref to Medical Weight Management    Other Visit Diagnoses    Acute non-recurrent sinusitis, unspecified location       Continue supportive care and guaifenesin. Add Augmentin and hydrocodone cough suppression.   Relevant Medications   amoxicillin-clavulanate (AUGMENTIN) 875-125 MG tablet   chlorpheniramine-HYDROcodone (TUSSIONEX PENNKINETIC ER) 10-8 MG/5ML SUER   Elevated LFTs       Relevant Orders   Comprehensive metabolic panel (Completed)   Screening for HIV (human  immunodeficiency virus)       Relevant Orders   HIV antibody (Completed)       Return in about 4 months (around 03/22/2017).   Fara Chute, PA-C [facility]

## 2016-11-19 NOTE — Patient Instructions (Signed)
     IF you received an x-ray today, you will receive an invoice from [facility]. Please contact [facility] at [phone] with questions or concerns regarding your invoice.   IF you received labwork today, you will receive an invoice from [facility]. Please contact [facility] at [phone] with questions or concerns regarding your invoice.   Our billing staff will not be able to assist you with questions regarding bills from these companies.  You will be contacted with the lab results as soon as they are available. The fastest way to get your results is to activate your My Chart account. Instructions are located on the last page of this paperwork. If you have not heard from us regarding the results in 2 weeks, please contact this office.     

## 2016-11-20 LAB — COMPREHENSIVE METABOLIC PANEL
A/G RATIO: 1.7 (ref 1.2–2.2)
ALBUMIN: 4.2 g/dL (ref 3.6–4.8)
ALK PHOS: 102 IU/L (ref 39–117)
ALT: 34 IU/L — AB (ref 0–32)
AST: 31 IU/L (ref 0–40)
BILIRUBIN TOTAL: 0.4 mg/dL (ref 0.0–1.2)
BUN/Creatinine Ratio: 17 (ref 12–28)
BUN: 14 mg/dL (ref 8–27)
CO2: 27 mmol/L (ref 18–29)
CREATININE: 0.84 mg/dL (ref 0.57–1.00)
Calcium: 9.6 mg/dL (ref 8.7–10.3)
Chloride: 97 mmol/L (ref 96–106)
GFR calc Af Amer: 85 mL/min/{1.73_m2} (ref >59)
GFR, EST NON AFRICAN AMERICAN: 74 mL/min/{1.73_m2} (ref >59)
GLOBULIN, TOTAL: 2.5 g/dL (ref 1.5–4.5)
GLUCOSE: 127 mg/dL — AB (ref 65–99)
Potassium: 3.8 mmol/L (ref 3.5–5.2)
Sodium: 141 mmol/L (ref 134–144)
Total Protein: 6.7 g/dL (ref 6.0–8.5)

## 2016-11-20 LAB — HIV ANTIBODY (ROUTINE TESTING W REFLEX): HIV Screen 4th Generation wRfx: NONREACTIVE

## 2016-11-21 NOTE — Assessment & Plan Note (Signed)
Ready for help. Refer to [facility].

## 2016-11-21 NOTE — Assessment & Plan Note (Signed)
Update CMET today. Continue healthy lifestyle changes.

## 2016-11-21 NOTE — Assessment & Plan Note (Signed)
Controlled on amlodipine 10 and HCTZ 12.5.

## 2017-03-06 ENCOUNTER — Encounter (INDEPENDENT_AMBULATORY_CARE_PROVIDER_SITE_OTHER): Payer: BLUE CROSS/BLUE SHIELD

## 2017-03-07 DIAGNOSIS — H04123 Dry eye syndrome of bilateral lacrimal glands: Secondary | ICD-10-CM | POA: Diagnosis not present

## 2017-03-17 ENCOUNTER — Encounter (INDEPENDENT_AMBULATORY_CARE_PROVIDER_SITE_OTHER): Payer: Self-pay | Admitting: Family Medicine

## 2017-03-17 ENCOUNTER — Ambulatory Visit (INDEPENDENT_AMBULATORY_CARE_PROVIDER_SITE_OTHER): Payer: Medicare Other | Admitting: Family Medicine

## 2017-03-17 VITALS — BP 162/78 | HR 61 | Temp 97.9°F | Ht 61.0 in | Wt 173.0 lb

## 2017-03-17 DIAGNOSIS — E559 Vitamin D deficiency, unspecified: Secondary | ICD-10-CM | POA: Insufficient documentation

## 2017-03-17 DIAGNOSIS — Z6832 Body mass index (BMI) 32.0-32.9, adult: Secondary | ICD-10-CM

## 2017-03-17 DIAGNOSIS — R5383 Other fatigue: Secondary | ICD-10-CM | POA: Diagnosis not present

## 2017-03-17 DIAGNOSIS — I10 Essential (primary) hypertension: Secondary | ICD-10-CM | POA: Diagnosis not present

## 2017-03-17 DIAGNOSIS — K760 Fatty (change of) liver, not elsewhere classified: Secondary | ICD-10-CM

## 2017-03-17 DIAGNOSIS — R0602 Shortness of breath: Secondary | ICD-10-CM

## 2017-03-17 DIAGNOSIS — R739 Hyperglycemia, unspecified: Secondary | ICD-10-CM

## 2017-03-17 DIAGNOSIS — Z1389 Encounter for screening for other disorder: Secondary | ICD-10-CM | POA: Diagnosis not present

## 2017-03-17 DIAGNOSIS — E784 Other hyperlipidemia: Secondary | ICD-10-CM | POA: Diagnosis not present

## 2017-03-17 DIAGNOSIS — Z1331 Encounter for screening for depression: Secondary | ICD-10-CM

## 2017-03-17 DIAGNOSIS — Z0289 Encounter for other administrative examinations: Secondary | ICD-10-CM

## 2017-03-17 DIAGNOSIS — E669 Obesity, unspecified: Secondary | ICD-10-CM

## 2017-03-17 DIAGNOSIS — R011 Cardiac murmur, unspecified: Secondary | ICD-10-CM | POA: Diagnosis not present

## 2017-03-17 DIAGNOSIS — E7849 Other hyperlipidemia: Secondary | ICD-10-CM

## 2017-03-17 NOTE — Progress Notes (Signed)
Office: [phone]  /  Fax: [phone]   Dear Harrison Mons, PA-C,   Thank you for referring Carly Walker to our clinic. The following note includes my evaluation and treatment recommendations.  HPI:   Chief Complaint: OBESITY    Carly Walker has been referred by Harrison Mons, PA-C for consultation regarding her obesity and obesity related comorbidities.    Carly Walker (MR# [id number]) is a 66 y.o. female who presents on 03/17/2017 for obesity evaluation and treatment. Current BMI is Body mass index is 32.69 kg/m.Marland Kitchen Carly Walker has been struggling with her weight for many years and has been unsuccessful in either losing weight, maintaining weight loss, or reaching her healthy weight goal.     Carly Walker attended our information session and states she is currently in the action stage of change and ready to dedicate time achieving and maintaining a healthier weight. Carly Walker is interested in becoming our patient and working on intensive lifestyle modifications including (but not limited to) diet, exercise and weight loss.    Carly Walker states her family eats meals together she struggles with family and or coworkers weight loss sabotage her desired weight loss is 37.8 to 42.8 she started gaining weight in her 40's her heaviest weight ever was 180 lbs. she has significant food cravings issues  she snacks frequently in the evenings she skips meals frequently she is trying to eat Ovo-lacto vegetarian she is frequently drinking liquids with calories she frequently makes poor food choices she has problems with excessive hunger  she frequently eats larger portions than normal  she has binge eating behaviors she struggles with emotional eating    Carly Walker feels her energy is lower than it should be. This has worsened with weight gain and has not worsened recently. Carly Walker admits to daytime somnolence and  denies waking up still tired. Patient is at risk for obstructive sleep apnea.  Patent has a history of symptoms of daytime Carly, morning headache and hypertension. Patient generally gets 6 hours of sleep per night, and states they generally have restful sleep. Snoring is present. Apneic episodes are not present. Epworth Sleepiness Score is 11  Dyspnea on exertion Carly Walker notes increasing shortness of breath with exercising and seems to be worsening over time with weight gain. She notes getting out of breath sooner with activity than she used to. This has not gotten worse recently. Carly Walker denies orthopnea.  Vitamin D deficiency Carly Walker has a diagnosis of vitamin D deficiency. She is not currently taking vit D and admits Carly but denies nausea, vomiting or muscle weakness.  Non Alcoholic Fatty Liver Disease Carly Walker has a diagnosis of NAFLD. She denies abdominal pain or jaundice and has never been told of any liver problems in the past. She denies excessive alcohol intake.  New Onset Heart Murmur Annis has Grade 2/6 early blowing systolic heart murmur and has no history of echocardiogram being done. She denies shortness of breath at rest but admits only with exercise. Seini has mild bilateral lower extremity edema.  Hypertension Carly Walker is a 66 y.o. female with hypertension. Her blood pressure is elevated today and she states her blood pressure is normally controlled on amlodipine and HCTZ. Carly Walker denies chest pain. She is working weight loss to help control her blood pressure with the goal of decreasing her risk of heart attack and stroke. Deborahs blood pressure is not currently controlled.  Hyperglycemia Carly Walker has a history of elevated A1c at 6.3 in the past without a diagnosis  of diabetes. She admits to polyphagia and she mostly eats carbohydrates.   Depression Screen Carly Walker Food and Mood (modified PHQ-9) score was  Depression screen PHQ 2/9 03/17/2017  Decreased Interest 1  Down, Depressed, Hopeless 1  PHQ - 2 Score 2  Altered  sleeping 1  Tired, decreased energy 2  Change in appetite 3  Feeling bad or failure about yourself  1  Trouble concentrating 1  Moving slowly or fidgety/restless 0  Suicidal thoughts 0  PHQ-9 Score 10  Difficult doing work/chores Somewhat difficult    ALLERGIES: Allergies  Allergen Reactions  . Ace Inhibitors Cough  . Codeine Nausea Only  . Hydrocodone   . Sulfa Antibiotics Hives    MEDICATIONS: Current Outpatient Prescriptions on File Prior to Visit  Medication Sig Dispense Refill  . acyclovir (ZOVIRAX) 400 MG tablet Take 800 mg by mouth 2 (two) times daily. Ophthalmic HSV 1     . amLODipine (NORVASC) 10 MG tablet Take 1 tablet daily 90 tablet 3  . aspirin 81 MG tablet Take 81 mg by mouth daily.    . hydrochlorothiazide (MICROZIDE) 12.5 MG capsule Take 1 capsule (12.5 mg total) by mouth daily. 90 capsule 3  . Multiple Vitamin (MULTIVITAMIN) tablet Take 1 tablet by mouth daily. Over the counter, for over 50 individuals( called 50+)     . omeprazole (PRILOSEC OTC) 20 MG tablet Take 20 mg by mouth daily.    . pravastatin (PRAVACHOL) 40 MG tablet Take 1 tablet (40 mg total) by mouth daily. 90 tablet 3   No current facility-administered medications on file prior to visit.     PAST MEDICAL HISTORY: Past Medical History:  Diagnosis Date  . Allergic rhinitis, cause unspecified   . Allergy    SEASONAL  . Arthritis    LOWER BACK  . Back pain   . Cancer [facility])    ENDOMETRIAL CANCER  . Cancer ([place])   . Chest pain   . Colon polyps 01/23/2006   type unknown   . Constipation   . Depression   . Essential hypertension, benign   . Fatty liver   . GERD (gastroesophageal reflux disease)   . History of endometrial cancer 07/2009  . HTN (hypertension)   . Hypercholesteremia   . IBS (irritable bowel syndrome)   . Joint pain   . Leg edema   . Microscopic hematuria   . Migraine   . Mixed hyperlipidemia   . Ophthalmic herpes simplex   . Palpitations   . Proteinuria   . Tremor,  essential   . Vitamin D deficiency     PAST SURGICAL HISTORY: Past Surgical History:  Procedure Laterality Date  . ABDOMINAL HYSTERECTOMY    . APPENDECTOMY    . CESAREAN SECTION    . CYST REMOVED FROM R WRIST    . HERNIA REPAIR  03/2010  . REPEAT CESAREAN SECTION    . TUBAL LIGATION    . WRIST FRACTURE SURGERY Left 07/31/2015    SOCIAL HISTORY: Social History  Substance Use Topics  . Smoking status: Never Smoker  . Smokeless tobacco: Never Used  . Alcohol use No    FAMILY HISTORY: Family History  Problem Relation Age of Onset  . Breast cancer Mother   . Emphysema Father   . Diabetes Father   . Heart disease Father   . Alcoholism Father   . Heart disease Paternal Grandmother   . Diabetes Brother   . Heart disease Brother     ROS: Review of  Systems  Constitutional: Positive for malaise/Carly.  HENT: Positive for congestion (nasal stuffiness), hearing loss, sinus pain and tinnitus.        Dry Mouth  Eyes: Positive for redness.       Wear Glasses or Contacts  Cardiovascular: Negative for chest pain and orthopnea.  Gastrointestinal: Negative for abdominal pain, nausea and vomiting.       Negative jaundice  Musculoskeletal:       Muscle Stiffness Negative muscle weakness  Skin:       Dryness   Neurological: Positive for tremors.  Endo/Heme/Allergies:       Polyphagia  Psychiatric/Behavioral: The patient has insomnia.     PHYSICAL EXAM: Blood pressure (!) 162/78, pulse 61, temperature 97.9 F (36.6 C), temperature source Oral, height 5\' 1"  (1.549 m), weight 173 lb (78.5 kg), SpO2 96 %. Body mass index is 32.69 kg/m. Physical Exam  Constitutional: She is oriented to person, place, and time. She appears well-developed and well-nourished.  Cardiovascular:  Murmur (Grade 2/6 early blowing systolic murmur) heard. Pulmonary/Chest: Effort normal.  Musculoskeletal: Normal range of motion. She exhibits edema (trace edema bilateral lower extremities).    Neurological: She is oriented to person, place, and time.  Skin: Skin is warm and dry.  Psychiatric: She has a normal mood and affect. Her behavior is normal.  Vitals reviewed.   RECENT LABS AND TESTS: BMET    Component Value Date/Time   NA 141 11/19/2016 1053   K 3.8 11/19/2016 1053   CL 97 11/19/2016 1053   CO2 27 11/19/2016 1053   GLUCOSE 127 (H) 11/19/2016 1053   GLUCOSE 102 (H) 12/08/2015 1037   BUN 14 11/19/2016 1053   CREATININE 0.84 11/19/2016 1053   CREATININE 0.82 12/08/2015 1037   CALCIUM 9.6 11/19/2016 1053   GFRNONAA 74 11/19/2016 1053   GFRNONAA 76 12/08/2015 1037   GFRAA 85 11/19/2016 1053   GFRAA 88 12/08/2015 1037   Lab Results  Component Value Date   HGBA1C 6.3 (H) 12/08/2015   No results found for: INSULIN CBC    Component Value Date/Time   WBC 7.6 10/22/2016 1457   WBC 7.1 12/08/2015 1058   WBC 5.1 09/15/2014 0942   RBC 4.93 10/22/2016 1457   RBC 5.11 12/08/2015 1058   RBC 4.90 09/15/2014 0942   HGB 15.1 10/22/2016 1457   HCT 43.9 10/22/2016 1457   PLT 297 10/22/2016 1457   MCV 89 10/22/2016 1457   MCH 30.6 10/22/2016 1457   MCH 31.1 12/08/2015 1058   MCH 30.4 09/15/2014 0942   MCHC 34.4 10/22/2016 1457   MCHC 35.9 (A) 12/08/2015 1058   MCHC 34.5 09/15/2014 0942   RDW 13.9 10/22/2016 1457   LYMPHSABS 2.9 10/22/2016 1457   MONOABS 0.4 09/15/2014 0942   EOSABS 0.2 10/22/2016 1457   BASOSABS 0.0 10/22/2016 1457   Iron/TIBC/Ferritin/ %Sat    Component Value Date/Time   IRON 97 12/18/2015 1218   TIBC 394 12/18/2015 1218   FERRITIN 31 12/18/2015 1218   IRONPCTSAT 25 12/18/2015 1218   Lipid Panel     Component Value Date/Time   CHOL 189 10/22/2016 1457   TRIG 151 (H) 10/22/2016 1457   HDL 54 10/22/2016 1457   CHOLHDL 3.5 10/22/2016 1457   CHOLHDL 3.6 12/08/2015 1037   VLDL 33 (H) 12/08/2015 1037   LDLCALC 105 (H) 10/22/2016 1457   Hepatic Function Panel     Component Value Date/Time   PROT 6.7 11/19/2016 1053   ALBUMIN 4.2  11/19/2016 1053  AST 31 11/19/2016 1053   ALT 34 (H) 11/19/2016 1053   ALKPHOS 102 11/19/2016 1053   BILITOT 0.4 11/19/2016 1053      Component Value Date/Time   TSH 2.220 10/22/2016 1457   TSH 1.75 12/08/2015 1037    ECG  shows NSR with a rate of 58 BPM INDIRECT CALORIMETER done today shows a VO2 of 237 and a REE of 1647.  Her calculated basal metabolic rate is 2585 thus her basal metabolic rate is better than expected.    ASSESSMENT AND PLAN: Other Carly - Plan: EKG 12-Lead, CBC With Differential, T3, T4, free, TSH  Shortness of breath on exertion  NAFLD (nonalcoholic fatty liver disease)  Murmur - Plan: Lipid Panel With LDL/HDL Ratio, ECHOCARDIOGRAM COMPLETE  Essential hypertension  Vitamin D deficiency - Plan: VITAMIN D 25 Hydroxy (Vit-D Deficiency, Fractures)  Hyperglycemia - Plan: Comprehensive metabolic panel, Hemoglobin A1c, Insulin, random  Depression screening  Class 1 obesity with serious comorbidity and body mass index (BMI) of 32.0 to 32.9 in adult, unspecified obesity type  PLAN: Carly Carly Walker was informed that her Carly may be related to obesity, depression or many other causes. Labs will be ordered, and in the meanwhile Shaquira has agreed to work on diet, exercise and weight loss to help with Carly. Proper sleep hygiene was discussed including the need for 7-8 hours of quality sleep each night. A sleep study was not ordered based on symptoms and Epworth score.  Dyspnea on exertion Dalaya's shortness of breath appears to be obesity related and exercise induced. She has agreed to work on weight loss and gradually increase exercise to treat her exercise induced shortness of breath. If Carly Walker follows our instructions and loses weight without improvement of her shortness of breath, we will plan to refer to pulmonology. We will monitor this condition regularly. Otie agrees to this plan.  Vitamin D Deficiency Carly Walker was informed that low vitamin D  levels contributes to Carly and are associated with obesity, breast, and colon cancer. We will check labs and will follow up for routine testing of vitamin D, at least 2-3 times per year. She was informed of the risk of over-replacement of vitamin D and agrees to not increase her dose unless he discusses this with [place] first. Carly Walker agrees to follow up with our clinic in 2 weeks.  Non Alcoholic Fatty Liver Disease We discussed the diagnosis of non alcoholic fatty liver disease today and how this condition is obesity related. Carly Walker was educated on her risk of developing NASH or even liver failure and the only proven treatment for NAFLD was weight loss. Carly Walker agreed to continue with her weight loss efforts with healthier diet and exercise as an essential part of her treatment plan.  New Onset Heart Murmur We will refer Delayni to [facility] [address], [place] [address] [phone]  for an echocardiogram and will follow..  Hypertension We discussed sodium restriction, working on healthy weight loss, and a regular exercise program as the means to achieve improved blood pressure control. We will check labs and Ardra agreed with this plan and agreed to follow up as directed. We will re-check blood pressure in 2 weeks and will continue to monitor her blood pressure as well as her progress with the above lifestyle modifications. She will continue her medications as prescribed and will watch for signs of hypotension as she continues her lifestyle modifications.   Hyperglycemia Fasting labs will be obtained and results with  be discussed with Neoma Laming in 2 weeks at her follow up visit. In the meanwhile Shylin was started on a lower simple carbohydrate diet and will work on weight loss efforts.  Depression Screen Aairah had a moderately positive depression screening. Depression is commonly associated with obesity and often results in emotional eating  behaviors. We will monitor this closely and work on CBT to help improve the non-hunger eating patterns. Referral to Psychology may be required if no improvement is seen as she continues in our clinic.  Obesity Wessie is currently in the action stage of change and her goal is to continue with weight loss efforts. I recommend Kloee begin the structured treatment plan as follows:  She has agreed to follow our Pescatarian eating plan +200 calories daily Akiya has been instructed to eventually work up to a goal of 150 minutes of combined cardio and strengthening exercise per week for weight loss and overall health benefits. We discussed the following Behavioral Modification Strategies today: increase H2O intake, increasing lean protein intake, decreasing simple carbohydrates , decreasing sodium intake, decrease eating out, work on meal planning and easy cooking plans and dealing with family or coworker sabotage   She was informed of the importance of frequent follow up visits to maximize her success with intensive lifestyle modifications for her multiple health conditions. She was informed we would discuss her lab results at her next visit unless there is a critical issue that needs to be addressed sooner. Kritika agreed to keep her next visit at the agreed upon time to discuss these results.  I, Doreene Nest, am acting as transcriptionist for  Dennard Nip, MD  I have reviewed the above documentation for accuracy and completeness, and I agree with the above. -Dennard Nip, MD    OBESITY BEHAVIORAL INTERVENTION VISIT  Today's visit was # 1 out of 66.  Starting weight: 173 lbs Starting date: 03/17/17 Today's weight : 173 lbs Today's date: 03/17/2017 Total lbs lost to date: 0 (Patients must lose 7 lbs in the first 6 months to continue with counseling)   ASK: We discussed the diagnosis of obesity with Carly Walker today and Yoneko agreed to give [place] permission to discuss obesity  behavioral modification therapy today.  ASSESS: Latria has the diagnosis of obesity and her BMI today is 32.7 Destiney is in the action stage of change   ADVISE: Delynn was educated on the multiple health risks of obesity as well as the benefit of weight loss to improve her health. She was advised of the need for long term treatment and the importance of lifestyle modifications.  AGREE: Multiple dietary modification options and treatment options were discussed and  Arnelle agreed to follow the Pescatarian eating plan +200 calories daily We discussed the following Behavioral Modification Strategies today: increase H2O intake, increasing lean protein intake, decreasing simple carbohydrates , decreasing sodium intake, decrease eating out, work on meal planning and easy cooking plans and dealing with family or coworker sabotage

## 2017-03-18 LAB — COMPREHENSIVE METABOLIC PANEL
ALBUMIN: 4.5 g/dL (ref 3.6–4.8)
ALK PHOS: 94 IU/L (ref 39–117)
ALT: 64 IU/L — AB (ref 0–32)
AST: 55 IU/L — ABNORMAL HIGH (ref 0–40)
Albumin/Globulin Ratio: 1.7 (ref 1.2–2.2)
BILIRUBIN TOTAL: 0.4 mg/dL (ref 0.0–1.2)
BUN / CREAT RATIO: 17 (ref 12–28)
BUN: 14 mg/dL (ref 8–27)
CHLORIDE: 100 mmol/L (ref 96–106)
CO2: 26 mmol/L (ref 20–29)
CREATININE: 0.83 mg/dL (ref 0.57–1.00)
Calcium: 9.7 mg/dL (ref 8.7–10.3)
GFR calc non Af Amer: 74 mL/min/{1.73_m2} (ref >59)
GFR, EST AFRICAN AMERICAN: 86 mL/min/{1.73_m2} (ref >59)
GLUCOSE: 116 mg/dL — AB (ref 65–99)
Globulin, Total: 2.6 g/dL (ref 1.5–4.5)
Potassium: 3.9 mmol/L (ref 3.5–5.2)
Sodium: 145 mmol/L — ABNORMAL HIGH (ref 134–144)
TOTAL PROTEIN: 7.1 g/dL (ref 6.0–8.5)

## 2017-03-18 LAB — CBC WITH DIFFERENTIAL
Basophils Absolute: 0 10*3/uL (ref 0.0–0.2)
Basos: 1 %
EOS (ABSOLUTE): 0.2 10*3/uL (ref 0.0–0.4)
Eos: 3 %
Hematocrit: 44 % (ref 34.0–46.6)
Hemoglobin: 14.7 g/dL (ref 11.1–15.9)
IMMATURE GRANS (ABS): 0 10*3/uL (ref 0.0–0.1)
IMMATURE GRANULOCYTES: 0 %
LYMPHS: 41 %
Lymphocytes Absolute: 2.6 10*3/uL (ref 0.7–3.1)
MCH: 28.8 pg (ref 26.6–33.0)
MCHC: 33.4 g/dL (ref 31.5–35.7)
MCV: 86 fL (ref 79–97)
MONOS ABS: 0.5 10*3/uL (ref 0.1–0.9)
Monocytes: 9 %
NEUTROS PCT: 46 %
Neutrophils Absolute: 2.9 10*3/uL (ref 1.4–7.0)
RBC: 5.11 x10E6/uL (ref 3.77–5.28)
RDW: 14.6 % (ref 12.3–15.4)
WBC: 6.2 10*3/uL (ref 3.4–10.8)

## 2017-03-18 LAB — T4, FREE: FREE T4: 1.01 ng/dL (ref 0.82–1.77)

## 2017-03-18 LAB — INSULIN, RANDOM: INSULIN: 30.9 u[IU]/mL — AB (ref 2.6–24.9)

## 2017-03-18 LAB — LIPID PANEL WITH LDL/HDL RATIO
CHOLESTEROL TOTAL: 181 mg/dL (ref 100–199)
HDL: 44 mg/dL (ref >39)
LDL CALC: 104 mg/dL — AB (ref 0–99)
LDl/HDL Ratio: 2.4 ratio (ref 0.0–3.2)
TRIGLYCERIDES: 166 mg/dL — AB (ref 0–149)
VLDL Cholesterol Cal: 33 mg/dL (ref 5–40)

## 2017-03-18 LAB — HEMOGLOBIN A1C
Est. average glucose Bld gHb Est-mCnc: 157 mg/dL
Hgb A1c MFr Bld: 7.1 % — ABNORMAL HIGH (ref 4.8–5.6)

## 2017-03-18 LAB — VITAMIN D 25 HYDROXY (VIT D DEFICIENCY, FRACTURES): VIT D 25 HYDROXY: 36.8 ng/mL (ref 30.0–100.0)

## 2017-03-18 LAB — T3: T3, Total: 135 ng/dL (ref 71–180)

## 2017-03-18 LAB — TSH: TSH: 1.8 u[IU]/mL (ref 0.450–4.500)

## 2017-03-20 DIAGNOSIS — Z23 Encounter for immunization: Secondary | ICD-10-CM | POA: Diagnosis not present

## 2017-03-25 ENCOUNTER — Other Ambulatory Visit: Payer: Self-pay

## 2017-03-25 ENCOUNTER — Ambulatory Visit (HOSPITAL_COMMUNITY): Payer: Medicare Other | Attending: Cardiology

## 2017-03-25 ENCOUNTER — Ambulatory Visit: Payer: BLUE CROSS/BLUE SHIELD | Admitting: Physician Assistant

## 2017-03-25 DIAGNOSIS — E785 Hyperlipidemia, unspecified: Secondary | ICD-10-CM | POA: Diagnosis not present

## 2017-03-25 DIAGNOSIS — E669 Obesity, unspecified: Secondary | ICD-10-CM | POA: Diagnosis not present

## 2017-03-25 DIAGNOSIS — R011 Cardiac murmur, unspecified: Secondary | ICD-10-CM

## 2017-03-25 DIAGNOSIS — Z6832 Body mass index (BMI) 32.0-32.9, adult: Secondary | ICD-10-CM | POA: Diagnosis not present

## 2017-03-25 DIAGNOSIS — I517 Cardiomegaly: Secondary | ICD-10-CM | POA: Insufficient documentation

## 2017-03-31 ENCOUNTER — Ambulatory Visit (INDEPENDENT_AMBULATORY_CARE_PROVIDER_SITE_OTHER): Payer: Medicare Other | Admitting: Family Medicine

## 2017-03-31 VITALS — Ht 61.0 in | Wt 163.0 lb

## 2017-03-31 DIAGNOSIS — E119 Type 2 diabetes mellitus without complications: Secondary | ICD-10-CM

## 2017-03-31 DIAGNOSIS — I517 Cardiomegaly: Secondary | ICD-10-CM | POA: Diagnosis not present

## 2017-03-31 DIAGNOSIS — Z6830 Body mass index (BMI) 30.0-30.9, adult: Secondary | ICD-10-CM | POA: Diagnosis not present

## 2017-03-31 DIAGNOSIS — I10 Essential (primary) hypertension: Secondary | ICD-10-CM

## 2017-03-31 DIAGNOSIS — E669 Obesity, unspecified: Secondary | ICD-10-CM | POA: Diagnosis not present

## 2017-03-31 DIAGNOSIS — E559 Vitamin D deficiency, unspecified: Secondary | ICD-10-CM

## 2017-03-31 MED ORDER — ONETOUCH VERIO W/DEVICE KIT: 1.0000 | PACK | Freq: Two times a day (BID) | 0 refills | Status: AC

## 2017-03-31 MED ORDER — GLUCOSE BLOOD VI STRP: ORAL_STRIP | 0 refills | Status: DC

## 2017-03-31 MED ORDER — ONETOUCH DELICA LANCETS 33G MISC: 1.0000 | Freq: Two times a day (BID) | 0 refills | Status: DC

## 2017-03-31 MED ORDER — VITAMIN D (ERGOCALCIFEROL) 1.25 MG (50000 UNIT) PO CAPS: 50000.0000 [IU] | ORAL_CAPSULE | ORAL | 0 refills | Status: DC

## 2017-04-01 NOTE — Progress Notes (Signed)
Office: [phone]  /  Fax: [phone]   HPI:   Chief Complaint: OBESITY Carly Walker is here to discuss her progress with her obesity treatment plan. She is on the  follow the Pescatarian eating plan and is following her eating plan approximately 99.9 % of the time. She states she is exercising 0 minutes 0 times per week. Carly Walker has done well with weight loss. She struggled to eat all the fish. Hunger was mostly controlled and she did well with planning her meals ahead of time. Her weight is 163 lb (73.9 kg) today and has had a weight loss of 10 pounds over a period of 2 weeks since her last visit. She has lost 10 lbs since starting treatment with Carly Walker.  Vitamin D deficiency Carly Walker has a diagnosis of vitamin D deficiency. She is currently on a multi vitamin and her level is within normal limits but not yet at goal. Carly Walker denies nausea, vomiting or muscle weakness.  Diabetes II [name] has a diagnosis of diabetes type II. Carly Walker states she is not checking BGs at home and denies any hypoglycemic episodes. Last A1c was elevated at 7.1 She has been working on intensive lifestyle modifications including diet, exercise, and weight loss to help control her blood glucose levels.  Hypertension Carly Walker is a 66 y.o. female with hypertension. Her blood pressure is stable but she has a history of mildly elevated blood pressure, even on medications. Carly Walker denies chest pain or shortness of breath on exertion. She is working weight loss to help control her blood pressure with the goal of decreasing her risk of heart attack and stroke. Deborahs blood pressure is currently controlled.  Left Ventricular Hypertrophy with Grade I diastolic dysfunction Carly Walker has a new diagnosis of LVH with grade I diastolic dysfunction. We discussed her echocardiogram results together. She denies chest pain, shortness of breath at rest or orthopnea. Carly Walker has trace lower extremity  edema.   ALLERGIES: Allergies  Allergen Reactions   Ace Inhibitors Cough   Codeine Nausea Only   Hydrocodone    Sulfa Antibiotics Hives    MEDICATIONS: Current Outpatient Prescriptions on File Prior to Visit  Medication Sig Dispense Refill   acyclovir (ZOVIRAX) 400 MG tablet Take 800 mg by mouth 2 (two) times daily. Ophthalmic HSV 1      amLODipine (NORVASC) 10 MG tablet Take 1 tablet daily 90 tablet 3   aspirin 81 MG tablet Take 81 mg by mouth daily.     cetirizine (ZYRTEC) 10 MG tablet Take 10 mg by mouth daily.     hydrochlorothiazide (MICROZIDE) 12.5 MG capsule Take 1 capsule (12.5 mg total) by mouth daily. 90 capsule 3   ketotifen (ZADITOR) 0.025 % ophthalmic solution 1 drop 2 (two) times daily.     Multiple Vitamin (MULTIVITAMIN) tablet Take 1 tablet by mouth daily. Over the counter, for over 50 individuals( called 50+)      omeprazole (PRILOSEC OTC) 20 MG tablet Take 20 mg by mouth daily.     pravastatin (PRAVACHOL) 40 MG tablet Take 1 tablet (40 mg total) by mouth daily. 90 tablet 3   No current facility-administered medications on file prior to visit.     PAST MEDICAL HISTORY: Past Medical History:  Diagnosis Date   Allergic rhinitis, cause unspecified    Allergy    SEASONAL   Arthritis    LOWER BACK   Back pain    Cancer ([place])    ENDOMETRIAL CANCER   Cancer ([place])  Chest pain    Colon polyps 01/23/2006   type unknown    Constipation    Depression    Essential hypertension, benign    Fatty liver    GERD (gastroesophageal reflux disease)    History of endometrial cancer 07/2009   HTN (hypertension)    Hypercholesteremia    IBS (irritable bowel syndrome)    Joint pain    Leg edema    Microscopic hematuria    Migraine    Mixed hyperlipidemia    Ophthalmic herpes simplex    Palpitations    Proteinuria    Tremor, essential    Vitamin D deficiency     PAST SURGICAL HISTORY: Past Surgical History:  Procedure  Laterality Date   ABDOMINAL HYSTERECTOMY     APPENDECTOMY     CESAREAN SECTION     CYST REMOVED FROM R WRIST     HERNIA REPAIR  03/2010   REPEAT CESAREAN SECTION     TUBAL LIGATION     WRIST FRACTURE SURGERY Left 07/31/2015    SOCIAL HISTORY: Social History  Substance Use Topics   Smoking status: Never Smoker   Smokeless tobacco: Never Used   Alcohol use No    FAMILY HISTORY: Family History  Problem Relation Age of Onset   Breast cancer Mother    Emphysema Father    Diabetes Father    Heart disease Father    Alcoholism Father    Heart disease Paternal Grandmother    Diabetes Brother    Heart disease Brother     ROS: Review of Systems  Constitutional: Positive for weight loss.  Respiratory: Negative for shortness of breath (on exertion).        Negative shortness of breath at rest  Cardiovascular: Negative for chest pain and orthopnea.       Positive edema   Gastrointestinal: Negative for nausea and vomiting.  Musculoskeletal:       Negative muscle weakness    PHYSICAL EXAM: Height _0  (1.549 m), weight 163 lb (73.9 kg). Body mass index is 30.8 kg/m. Physical Exam  Constitutional: She is oriented to person, place, and time. She appears well-developed and well-nourished.  Cardiovascular: Normal rate.   Pulmonary/Chest: Effort normal.  Musculoskeletal: Normal range of motion. She exhibits edema.  Neurological: She is oriented to person, place, and time.  Skin: Skin is warm and dry.  Psychiatric: She has a normal mood and affect. Her behavior is normal.  Vitals reviewed.   RECENT LABS AND TESTS: BMET    Component Value Date/Time   Carly Walker 145 (H) 03/17/2017 1220   K 3.9 03/17/2017 1220   CL 100 03/17/2017 1220   CO2 26 03/17/2017 1220   GLUCOSE 116 (H) 03/17/2017 1220   GLUCOSE 102 (H) 12/08/2015 1037   BUN 14 03/17/2017 1220   CREATININE 0.83 03/17/2017 1220   CREATININE 0.82 12/08/2015 1037   CALCIUM 9.7 03/17/2017 1220   GFRNONAA  74 03/17/2017 1220   GFRNONAA 76 12/08/2015 1037   GFRAA 86 03/17/2017 1220   GFRAA 88 12/08/2015 1037   Lab Results  Component Value Date   HGBA1C 7.1 (H) 03/17/2017   HGBA1C 6.3 (H) 12/08/2015   HGBA1C 5.9 10/01/2013   HGBA1C 6.0 10/27/2012   Lab Results  Component Value Date   INSULIN 30.9 (H) 03/17/2017   CBC    Component Value Date/Time   WBC 6.2 03/17/2017 1220   WBC 7.1 12/08/2015 1058   WBC 5.1 09/15/2014 0942   RBC 5.11 03/17/2017 1220  RBC 5.11 12/08/2015 1058   RBC 4.90 09/15/2014 0942   HGB 14.7 03/17/2017 1220   HCT 44.0 03/17/2017 1220   PLT 297 10/22/2016 1457   MCV 86 03/17/2017 1220   MCH 28.8 03/17/2017 1220   MCH 31.1 12/08/2015 1058   MCH 30.4 09/15/2014 0942   MCHC 33.4 03/17/2017 1220   MCHC 35.9 (A) 12/08/2015 1058   MCHC 34.5 09/15/2014 0942   RDW 14.6 03/17/2017 1220   LYMPHSABS 2.6 03/17/2017 1220   MONOABS 0.4 09/15/2014 0942   EOSABS 0.2 03/17/2017 1220   BASOSABS 0.0 03/17/2017 1220   Iron/TIBC/Ferritin/ %Sat    Component Value Date/Time   IRON 97 12/18/2015 1218   TIBC 394 12/18/2015 1218   FERRITIN 31 12/18/2015 1218   IRONPCTSAT 25 12/18/2015 1218   Lipid Panel     Component Value Date/Time   CHOL 181 03/17/2017 1220   TRIG 166 (H) 03/17/2017 1220   HDL 44 03/17/2017 1220   CHOLHDL 3.5 10/22/2016 1457   CHOLHDL 3.6 12/08/2015 1037   VLDL 33 (H) 12/08/2015 1037   LDLCALC 104 (H) 03/17/2017 1220   Hepatic Function Panel     Component Value Date/Time   PROT 7.1 03/17/2017 1220   ALBUMIN 4.5 03/17/2017 1220   AST 55 (H) 03/17/2017 1220   ALT 64 (H) 03/17/2017 1220   ALKPHOS 94 03/17/2017 1220   BILITOT 0.4 03/17/2017 1220      Component Value Date/Time   TSH 1.800 03/17/2017 1220   TSH 2.220 10/22/2016 1457   TSH 1.75 12/08/2015 1037    ASSESSMENT AND PLAN: Vitamin D deficiency - Plan: Vitamin D, Ergocalciferol, (DRISDOL) 50000 units CAPS capsule  Type 2 diabetes mellitus without complication, without  long-term current use of insulin ([place]) - Plan: glucose blood test strip, ONETOUCH DELICA LANCETS 94W MISC, Blood Glucose Monitoring Suppl (ONETOUCH VERIO) w/Device KIT  Essential hypertension  Class 1 obesity with serious comorbidity and body mass index (BMI) of 30.0 to 30.9 in adult, unspecified obesity type  PLAN:  Vitamin D Deficiency Brandalyn was informed that low vitamin D levels contributes to fatigue and are associated with obesity, breast, and colon cancer. She agrees to continue to take OTC Vit D and start to take prescription Vit D _0 ,000 IU every week #4 with no refills and will follow up for routine testing of vitamin D, at least 2-3 times per year. She was informed of the risk of over-replacement of vitamin D and agrees to not increase her dose unless he discusses this with Carly Walker first. Mahsa agrees to follow up with our clinic in 2 weeks.  Diabetes II Daily has been given extensive diabetes education by myself today including ideal fasting and post-prandial blood glucose readings, individual ideal Hgb A1c goals  and hypoglycemia prevention. We discussed the importance of good blood sugar control to decrease the likelihood of diabetic complications such as nephropathy, neuropathy, limb loss, blindness, coronary artery disease, and death. We discussed the importance of intensive lifestyle modification including diet, exercise and weight loss as the first line treatment for diabetes. Ruberta agrees to check her BGs 2 times daily and continue her diabetes medications as prescribed, we will refill glucometer, strips and lancets and she will follow up at the agreed upon time.  Hypertension We discussed sodium restriction, working on healthy weight loss, and a regular exercise program as the means to achieve improved blood pressure control. Charletta agreed with this plan and agreed to follow up as directed. Sheniece agrees to check her  blood pressure at home and bring in machine to check against  our readings. We will continue to monitor her blood pressure as well as her progress with the above lifestyle modifications. She will continue her medications as prescribed and will watch for signs of hypotension as she continues her lifestyle modifications. We may need to adjust medications if her blood pressure continues to increase.  Left Ventricular Hypertrophy with Grade I diastolic dysfunction Zela was advised of the importance of blood pressure control and lifestyle changes were discussed today. Tammela agrees to follow up with our clinic in 2 weeks.  Obesity Ginni is currently in the action stage of change. As such, her goal is to continue with weight loss efforts She has agreed to follow the Pescatarian eating plan Camia has been instructed to work up to a goal of 150 minutes of combined cardio and strengthening exercise per week for weight loss and overall health benefits. We discussed the following Behavioral Modification Strategies today: increasing lean protein intake, decreasing simple carbohydrates  and work on meal planning and easy cooking plans  Ambert has agreed to follow up with our clinic in 2 weeks. She was informed of the importance of frequent follow up visits to maximize her success with intensive lifestyle modifications for her multiple health conditions.  I, Doreene Nest, am acting as transcriptionist for Dennard Nip, MD  I have reviewed the above documentation for accuracy and completeness, and I agree with the above. -Dennard Nip, MD   OBESITY BEHAVIORAL INTERVENTION VISIT  Today's visit was # 2 out of 13.  Starting weight: 173 lbs Starting date: 03/17/17 Today's weight : 163 lbs Today's date: 03/31/2017 Total lbs lost to date: 10 (Patients must lose 7 lbs in the first 6 months to continue with counseling)   ASK: We discussed the diagnosis of obesity with Carly Walker today and Rosalena agreed to give Carly Walker permission to discuss obesity behavioral  modification therapy today.  ASSESS: Aliani has the diagnosis of obesity and her BMI today is 30.81 Lynde is in the action stage of change   ADVISE: Denaisha was educated on the multiple health risks of obesity as well as the benefit of weight loss to improve her health. She was advised of the need for long term treatment and the importance of lifestyle modifications.  AGREE: Multiple dietary modification options and treatment options were discussed and  Nalini agreed to follow the Pescatarian eating plan We discussed the following Behavioral Modification Strategies today: increasing lean protein intake, decreasing simple carbohydrates  and work on meal planning and easy cooking plans

## 2017-04-02 ENCOUNTER — Encounter (INDEPENDENT_AMBULATORY_CARE_PROVIDER_SITE_OTHER): Payer: Self-pay | Admitting: Family Medicine

## 2017-04-02 ENCOUNTER — Other Ambulatory Visit (INDEPENDENT_AMBULATORY_CARE_PROVIDER_SITE_OTHER): Payer: Self-pay

## 2017-04-02 DIAGNOSIS — E119 Type 2 diabetes mellitus without complications: Secondary | ICD-10-CM

## 2017-04-02 MED ORDER — GLUCOSE BLOOD VI STRP
1.0000 | ORAL_STRIP | Freq: Two times a day (BID) | 0 refills | Status: DC
Start: 2017-04-02 — End: 2017-04-07

## 2017-04-02 NOTE — Telephone Encounter (Signed)
Please resend with BID instructions.

## 2017-04-07 ENCOUNTER — Other Ambulatory Visit (INDEPENDENT_AMBULATORY_CARE_PROVIDER_SITE_OTHER): Payer: Self-pay

## 2017-04-07 DIAGNOSIS — E119 Type 2 diabetes mellitus without complications: Secondary | ICD-10-CM

## 2017-04-07 MED ORDER — GLUCOSE BLOOD VI STRP: ORAL_STRIP | 0 refills | Status: DC

## 2017-04-14 ENCOUNTER — Ambulatory Visit (INDEPENDENT_AMBULATORY_CARE_PROVIDER_SITE_OTHER): Payer: Medicare Other | Admitting: Physician Assistant

## 2017-04-14 VITALS — BP 118/70 | HR 53 | Temp 97.8°F | Ht 61.0 in | Wt 160.0 lb

## 2017-04-14 DIAGNOSIS — E669 Obesity, unspecified: Secondary | ICD-10-CM | POA: Diagnosis not present

## 2017-04-14 DIAGNOSIS — E559 Vitamin D deficiency, unspecified: Secondary | ICD-10-CM | POA: Diagnosis not present

## 2017-04-14 DIAGNOSIS — Z6830 Body mass index (BMI) 30.0-30.9, adult: Secondary | ICD-10-CM | POA: Diagnosis not present

## 2017-04-15 NOTE — Progress Notes (Signed)
Office: [phone]  /  Fax: [phone]   HPI:   Chief Complaint: OBESITY Carly Walker is here to discuss her progress with her obesity treatment plan. She is on the Pescatarian eating plan and is following her eating plan approximately 97 % of the time. She states she is walking and doing yard work for exercise 60+ minutes per week. Carly Walker continues to do well with weight loss. She plans her meals well and states her hunger is well controlled. She would like more meal planning ideas. Her weight is 160 lb (72.6 kg) today and has had a weight loss of 3 pounds over a period of 2 weeks since her last visit. She has lost 13 lbs since starting treatment with [place].  Vitamin D deficiency Carly Walker has a diagnosis of vitamin D deficiency. She is currently taking vit D and denies nausea, vomiting or muscle weakness.   ALLERGIES: Allergies  Allergen Reactions  . Ace Inhibitors Cough  . Codeine Nausea Only  . Hydrocodone   . Sulfa Antibiotics Hives    MEDICATIONS: Current Outpatient Prescriptions on File Prior to Visit  Medication Sig Dispense Refill  . acyclovir (ZOVIRAX) 400 MG tablet Take 800 mg by mouth 2 (two) times daily. Ophthalmic HSV 1     . amLODipine (NORVASC) 10 MG tablet Take 1 tablet daily 90 tablet 3  . aspirin 81 MG tablet Take 81 mg by mouth daily.    . Blood Glucose Monitoring Suppl (ONETOUCH VERIO) w/Device KIT 1 kit by Does not apply route 2 (two) times daily. 1 kit 0  . cetirizine (ZYRTEC) 10 MG tablet Take 10 mg by mouth daily.    Marland Kitchen glucose blood (ONETOUCH VERIO) test strip Test blood sugars twice daily 100 each 0  . hydrochlorothiazide (MICROZIDE) 12.5 MG capsule Take 1 capsule (12.5 mg total) by mouth daily. 90 capsule 3  . ketotifen (ZADITOR) 0.025 % ophthalmic solution 1 drop 2 (two) times daily.     . Multiple Vitamin (MULTIVITAMIN) tablet Take 1 tablet by mouth daily. Over the counter, for over 50 individuals( called 50+)     . omeprazole (PRILOSEC OTC) 20 MG tablet  Take 20 mg by mouth daily.    Carly Walker DELICA LANCETS 33A MISC 1 Bottle by Does not apply route 2 (two) times daily. 60 each 0  . pravastatin (PRAVACHOL) 40 MG tablet Take 1 tablet (40 mg total) by mouth daily. 90 tablet 3  . Vitamin D, Ergocalciferol, (DRISDOL) 50000 units CAPS capsule Take 1 capsule (50,000 Units total) by mouth every 7 (seven) days. 4 capsule 0   No current facility-administered medications on file prior to visit.     PAST MEDICAL HISTORY: Past Medical History:  Diagnosis Date  . Allergic rhinitis, cause unspecified   . Allergy    SEASONAL  . Arthritis    LOWER BACK  . Back pain   . Cancer [facility])    ENDOMETRIAL CANCER  . Cancer ([place])   . Chest pain   . Colon polyps 01/23/2006   type unknown   . Constipation   . Depression   . Essential hypertension, benign   . Fatty liver   . GERD (gastroesophageal reflux disease)   . History of endometrial cancer 07/2009  . HTN (hypertension)   . Hypercholesteremia   . IBS (irritable bowel syndrome)   . Joint pain   . Leg edema   . Microscopic hematuria   . Migraine   . Mixed hyperlipidemia   . Ophthalmic herpes simplex   .  Palpitations   . Proteinuria   . Tremor, essential   . Vitamin D deficiency     PAST SURGICAL HISTORY: Past Surgical History:  Procedure Laterality Date  . ABDOMINAL HYSTERECTOMY    . APPENDECTOMY    . CESAREAN SECTION    . CYST REMOVED FROM R WRIST    . HERNIA REPAIR  03/2010  . REPEAT CESAREAN SECTION    . TUBAL LIGATION    . WRIST FRACTURE SURGERY Left 07/31/2015    SOCIAL HISTORY: Social History  Substance Use Topics  . Smoking status: Never Smoker  . Smokeless tobacco: Never Used  . Alcohol use No    FAMILY HISTORY: Family History  Problem Relation Age of Onset  . Breast cancer Mother   . Emphysema Father   . Diabetes Father   . Heart disease Father   . Alcoholism Father   . Heart disease Paternal Grandmother   . Diabetes Brother   . Heart disease Brother      ROS: Review of Systems  Constitutional: Positive for weight loss.  Gastrointestinal: Negative for nausea and vomiting.  Musculoskeletal:       Negative muscle weakness    PHYSICAL EXAM: Blood pressure 118/70, pulse (!) 53, temperature 97.8 F (36.6 C), temperature source Oral, height '5\' 1"'  (1.549 m), weight 160 lb (72.6 kg), SpO2 93 %. Body mass index is 30.23 kg/m. Physical Exam  Constitutional: She is oriented to person, place, and time. She appears well-developed and well-nourished.  Cardiovascular:  Bradycardic  Pulmonary/Chest: Effort normal.  Musculoskeletal: Normal range of motion.  Neurological: She is oriented to person, place, and time.  Skin: Skin is warm and dry.  Psychiatric: She has a normal mood and affect. Her behavior is normal.  Vitals reviewed.   RECENT LABS AND TESTS: BMET    Component Value Date/Time   NA 145 (H) 03/17/2017 1220   K 3.9 03/17/2017 1220   CL 100 03/17/2017 1220   CO2 26 03/17/2017 1220   GLUCOSE 116 (H) 03/17/2017 1220   GLUCOSE 102 (H) 12/08/2015 1037   BUN 14 03/17/2017 1220   CREATININE 0.83 03/17/2017 1220   CREATININE 0.82 12/08/2015 1037   CALCIUM 9.7 03/17/2017 1220   GFRNONAA 74 03/17/2017 1220   GFRNONAA 76 12/08/2015 1037   GFRAA 86 03/17/2017 1220   GFRAA 88 12/08/2015 1037   Lab Results  Component Value Date   HGBA1C 7.1 (H) 03/17/2017   HGBA1C 6.3 (H) 12/08/2015   HGBA1C 5.9 10/01/2013   HGBA1C 6.0 10/27/2012   Lab Results  Component Value Date   INSULIN 30.9 (H) 03/17/2017   CBC    Component Value Date/Time   WBC 6.2 03/17/2017 1220   WBC 7.1 12/08/2015 1058   WBC 5.1 09/15/2014 0942   RBC 5.11 03/17/2017 1220   RBC 5.11 12/08/2015 1058   RBC 4.90 09/15/2014 0942   HGB 14.7 03/17/2017 1220   HCT 44.0 03/17/2017 1220   PLT 297 10/22/2016 1457   MCV 86 03/17/2017 1220   MCH 28.8 03/17/2017 1220   MCH 31.1 12/08/2015 1058   MCH 30.4 09/15/2014 0942   MCHC 33.4 03/17/2017 1220   MCHC 35.9 (A)  12/08/2015 1058   MCHC 34.5 09/15/2014 0942   RDW 14.6 03/17/2017 1220   LYMPHSABS 2.6 03/17/2017 1220   MONOABS 0.4 09/15/2014 0942   EOSABS 0.2 03/17/2017 1220   BASOSABS 0.0 03/17/2017 1220   Iron/TIBC/Ferritin/ %Sat    Component Value Date/Time   IRON 97 12/18/2015 1218  TIBC 394 12/18/2015 1218   FERRITIN 31 12/18/2015 1218   IRONPCTSAT 25 12/18/2015 1218   Lipid Panel     Component Value Date/Time   CHOL 181 03/17/2017 1220   TRIG 166 (H) 03/17/2017 1220   HDL 44 03/17/2017 1220   CHOLHDL 3.5 10/22/2016 1457   CHOLHDL 3.6 12/08/2015 1037   VLDL 33 (H) 12/08/2015 1037   LDLCALC 104 (H) 03/17/2017 1220   Hepatic Function Panel     Component Value Date/Time   PROT 7.1 03/17/2017 1220   ALBUMIN 4.5 03/17/2017 1220   AST 55 (H) 03/17/2017 1220   ALT 64 (H) 03/17/2017 1220   ALKPHOS 94 03/17/2017 1220   BILITOT 0.4 03/17/2017 1220      Component Value Date/Time   TSH 1.800 03/17/2017 1220   TSH 2.220 10/22/2016 1457   TSH 1.75 12/08/2015 1037    ASSESSMENT AND PLAN: Vitamin D deficiency  Class 1 obesity with serious comorbidity and body mass index (BMI) of 30.0 to 30.9 in adult, unspecified obesity type  PLAN:  Vitamin D Deficiency Carly Walker was informed that low vitamin D levels contributes to fatigue and are associated with obesity, breast, and colon cancer. She agrees to continue to take prescription Vit D '@50' ,000 IU every week and will follow up for routine testing of vitamin D, at least 2-3 times per year. She was informed of the risk of over-replacement of vitamin D and agrees to not increase her dose unless he discusses this with [place] first.  We spent > than 50% of the 15 minute visit on the counseling as documented in the note.   Obesity Carly Walker is currently in the action stage of change. As such, her goal is to continue with weight loss efforts She has agreed to follow the Pescatarian eating plan Carly Walker has been instructed to work up to a goal of  150 minutes of combined cardio and strengthening exercise per week for weight loss and overall health benefits. We discussed the following Behavioral Modification Strategies today: increasing lean protein intake and work on meal planning and easy cooking plans  Carly Walker has agreed to follow up with our clinic in 2 to 3 weeks. She was informed of the importance of frequent follow up visits to maximize her success with intensive lifestyle modifications for her multiple health conditions.  I, Doreene Nest, am acting as transcriptionist for Lacy Duverney, PA-C  I have reviewed the above documentation for accuracy and completeness, and I agree with the above. -Lacy Duverney, PA-C  I have reviewed the above note and agree with the plan. -Dennard Nip, MD  OBESITY BEHAVIORAL INTERVENTION VISIT  Today's visit was # 3 out of 22.  Starting weight: 173 lbs Starting date: 03/17/17 Today's weight : 160 lbs Today's date: 04/14/2017 Total lbs lost to date: 13 (Patients must lose 7 lbs in the first 6 months to continue with counseling)   ASK: We discussed the diagnosis of obesity with Carly Walker today and Carly Walker agreed to give [place] permission to discuss obesity behavioral modification therapy today.  ASSESS: Carly Walker has the diagnosis of obesity and her BMI today is 30.25 Carly Walker is in the action stage of change   ADVISE: Carly Walker was educated on the multiple health risks of obesity as well as the benefit of weight loss to improve her health. She was advised of the need for long term treatment and the importance of lifestyle modifications.  AGREE: Multiple dietary modification options and treatment options were discussed and  Carly Walker  agreed to follow the Pescatarian eating plan We discussed the following Behavioral Modification Strategies today: increasing lean protein intake and work on meal planning and easy cooking plans

## 2017-04-21 ENCOUNTER — Other Ambulatory Visit (INDEPENDENT_AMBULATORY_CARE_PROVIDER_SITE_OTHER): Payer: Self-pay | Admitting: Family Medicine

## 2017-04-21 DIAGNOSIS — E559 Vitamin D deficiency, unspecified: Secondary | ICD-10-CM

## 2017-04-24 ENCOUNTER — Other Ambulatory Visit (INDEPENDENT_AMBULATORY_CARE_PROVIDER_SITE_OTHER): Payer: Self-pay | Admitting: Family Medicine

## 2017-04-24 DIAGNOSIS — E559 Vitamin D deficiency, unspecified: Secondary | ICD-10-CM

## 2017-04-28 DIAGNOSIS — R809 Proteinuria, unspecified: Secondary | ICD-10-CM | POA: Diagnosis not present

## 2017-04-28 DIAGNOSIS — E785 Hyperlipidemia, unspecified: Secondary | ICD-10-CM | POA: Diagnosis not present

## 2017-04-28 DIAGNOSIS — R3129 Other microscopic hematuria: Secondary | ICD-10-CM | POA: Diagnosis not present

## 2017-04-28 DIAGNOSIS — I10 Essential (primary) hypertension: Secondary | ICD-10-CM | POA: Diagnosis not present

## 2017-04-29 ENCOUNTER — Ambulatory Visit (INDEPENDENT_AMBULATORY_CARE_PROVIDER_SITE_OTHER): Payer: Medicare Other | Admitting: Physician Assistant

## 2017-04-29 VITALS — BP 119/73 | HR 55 | Temp 97.6°F | Ht 61.0 in | Wt 156.0 lb

## 2017-04-29 DIAGNOSIS — E559 Vitamin D deficiency, unspecified: Secondary | ICD-10-CM

## 2017-04-29 DIAGNOSIS — Z6830 Body mass index (BMI) 30.0-30.9, adult: Secondary | ICD-10-CM

## 2017-04-29 DIAGNOSIS — E119 Type 2 diabetes mellitus without complications: Secondary | ICD-10-CM | POA: Diagnosis not present

## 2017-04-29 DIAGNOSIS — E669 Obesity, unspecified: Secondary | ICD-10-CM | POA: Diagnosis not present

## 2017-04-29 MED ORDER — VITAMIN D (ERGOCALCIFEROL) 1.25 MG (50000 UNIT) PO CAPS: 50000.0000 [IU] | ORAL_CAPSULE | ORAL | 0 refills | Status: DC

## 2017-04-29 NOTE — Progress Notes (Signed)
Office: [phone]  /  Fax: [phone]   HPI:   Chief Complaint: OBESITY Carly Walker is here to discuss her progress with her obesity treatment plan. She is on the  follow the Pescatarian eating plan and is following her eating plan approximately 75 % of the time. She states she is walking for 30 minutes 5 times per week. Takera continues to do well with weight loss. She plans her meals well and states hunger is well controlled. She would like more variety at dinner. Her weight is 156 lb (70.8 kg) today and has had a weight loss of 4 pounds over a period of 2 weeks since her last visit. She has lost 17 lbs since starting treatment with [place].  Vitamin D deficiency Carly Walker has a diagnosis of vitamin D deficiency. She is currently taking vit D and denies nausea, vomiting or muscle weakness.  Diabetes II Carly Walker has a diagnosis of diabetes type II and is not on medications currently. Carly Walker states fasting BGs range between 90's and 130's and post prandial range between 90 and 130's and she denies any hypoglycemic episodes. She has been working on intensive lifestyle modifications including diet, exercise, and weight loss to help control her blood glucose levels.   ALLERGIES: Allergies  Allergen Reactions  . Ace Inhibitors Cough  . Codeine Nausea Only  . Hydrocodone   . Sulfa Antibiotics Hives    MEDICATIONS: Current Outpatient Prescriptions on File Prior to Visit  Medication Sig Dispense Refill  . acyclovir (ZOVIRAX) 400 MG tablet Take 800 mg by mouth 2 (two) times daily. Ophthalmic HSV 1     . amLODipine (NORVASC) 10 MG tablet Take 1 tablet daily 90 tablet 3  . aspirin 81 MG tablet Take 81 mg by mouth daily.    . Blood Glucose Monitoring Suppl (ONETOUCH VERIO) w/Device KIT 1 kit by Does not apply route 2 (two) times daily. 1 kit 0  . cetirizine (ZYRTEC) 10 MG tablet Take 10 mg by mouth daily.    Marland Kitchen glucose blood (ONETOUCH VERIO) test strip Test blood sugars twice daily 100 each 0    . hydrochlorothiazide (MICROZIDE) 12.5 MG capsule Take 1 capsule (12.5 mg total) by mouth daily. 90 capsule 3  . ketotifen (ZADITOR) 0.025 % ophthalmic solution 1 drop 2 (two) times daily.     . Multiple Vitamin (MULTIVITAMIN) tablet Take 1 tablet by mouth daily. Over the counter, for over 50 individuals( called 50+)     . omeprazole (PRILOSEC OTC) 20 MG tablet Take 20 mg by mouth daily.    Carly Walker DELICA LANCETS 06C MISC 1 Bottle by Does not apply route 2 (two) times daily. 60 each 0  . pravastatin (PRAVACHOL) 40 MG tablet Take 1 tablet (40 mg total) by mouth daily. 90 tablet 3  . Vitamin D, Ergocalciferol, (DRISDOL) 50000 units CAPS capsule Take 1 capsule (50,000 Units total) by mouth every 7 (seven) days. 4 capsule 0   No current facility-administered medications on file prior to visit.     PAST MEDICAL HISTORY: Past Medical History:  Diagnosis Date  . Allergic rhinitis, cause unspecified   . Allergy    SEASONAL  . Arthritis    LOWER BACK  . Back pain   . Cancer [facility])    ENDOMETRIAL CANCER  . Cancer ([place])   . Chest pain   . Colon polyps 01/23/2006   type unknown   . Constipation   . Depression   . Essential hypertension, benign   . Fatty liver   .  GERD (gastroesophageal reflux disease)   . History of endometrial cancer 07/2009  . HTN (hypertension)   . Hypercholesteremia   . IBS (irritable bowel syndrome)   . Joint pain   . Leg edema   . Microscopic hematuria   . Migraine   . Mixed hyperlipidemia   . Ophthalmic herpes simplex   . Palpitations   . Proteinuria   . Tremor, essential   . Vitamin D deficiency     PAST SURGICAL HISTORY: Past Surgical History:  Procedure Laterality Date  . ABDOMINAL HYSTERECTOMY    . APPENDECTOMY    . CESAREAN SECTION    . CYST REMOVED FROM R WRIST    . HERNIA REPAIR  03/2010  . REPEAT CESAREAN SECTION    . TUBAL LIGATION    . WRIST FRACTURE SURGERY Left 07/31/2015    SOCIAL HISTORY: Social History  Substance Use Topics   . Smoking status: Never Smoker  . Smokeless tobacco: Never Used  . Alcohol use No    FAMILY HISTORY: Family History  Problem Relation Age of Onset  . Breast cancer Mother   . Emphysema Father   . Diabetes Father   . Heart disease Father   . Alcoholism Father   . Heart disease Paternal Grandmother   . Diabetes Brother   . Heart disease Brother     ROS: Review of Systems  Constitutional: Positive for weight loss.  Gastrointestinal: Negative for nausea and vomiting.  Musculoskeletal:       Negative muscle weakness  Endo/Heme/Allergies:       Negative hypoglycemia    PHYSICAL EXAM: Blood pressure 119/73, pulse (!) 55, temperature 97.6 F (36.4 C), temperature source Oral, height _0  (1.549 m), weight 156 lb (70.8 kg), SpO2 98 %. Body mass index is 29.48 kg/m. Physical Exam  Constitutional: She is oriented to person, place, and time. She appears well-developed and well-nourished.  Cardiovascular:  Bradycardic  Pulmonary/Chest: Effort normal.  Musculoskeletal: Normal range of motion.  Neurological: She is oriented to person, place, and time.  Skin: Skin is warm and dry.  Psychiatric: She has a normal mood and affect. Her behavior is normal.  Vitals reviewed.   RECENT LABS AND TESTS: BMET    Component Value Date/Time   NA 145 (H) 03/17/2017 1220   K 3.9 03/17/2017 1220   CL 100 03/17/2017 1220   CO2 26 03/17/2017 1220   GLUCOSE 116 (H) 03/17/2017 1220   GLUCOSE 102 (H) 12/08/2015 1037   BUN 14 03/17/2017 1220   CREATININE 0.83 03/17/2017 1220   CREATININE 0.82 12/08/2015 1037   CALCIUM 9.7 03/17/2017 1220   GFRNONAA 74 03/17/2017 1220   GFRNONAA 76 12/08/2015 1037   GFRAA 86 03/17/2017 1220   GFRAA 88 12/08/2015 1037   Lab Results  Component Value Date   HGBA1C 7.1 (H) 03/17/2017   HGBA1C 6.3 (H) 12/08/2015   HGBA1C 5.9 10/01/2013   HGBA1C 6.0 10/27/2012   Lab Results  Component Value Date   INSULIN 30.9 (H) 03/17/2017   CBC    Component Value  Date/Time   WBC 6.2 03/17/2017 1220   WBC 7.1 12/08/2015 1058   WBC 5.1 09/15/2014 0942   RBC 5.11 03/17/2017 1220   RBC 5.11 12/08/2015 1058   RBC 4.90 09/15/2014 0942   HGB 14.7 03/17/2017 1220   HCT 44.0 03/17/2017 1220   PLT 297 10/22/2016 1457   MCV 86 03/17/2017 1220   MCH 28.8 03/17/2017 1220   MCH 31.1 12/08/2015 1058  MCH 30.4 09/15/2014 0942   MCHC 33.4 03/17/2017 1220   MCHC 35.9 (A) 12/08/2015 1058   MCHC 34.5 09/15/2014 0942   RDW 14.6 03/17/2017 1220   LYMPHSABS 2.6 03/17/2017 1220   MONOABS 0.4 09/15/2014 0942   EOSABS 0.2 03/17/2017 1220   BASOSABS 0.0 03/17/2017 1220   Iron/TIBC/Ferritin/ %Sat    Component Value Date/Time   IRON 97 12/18/2015 1218   TIBC 394 12/18/2015 1218   FERRITIN 31 12/18/2015 1218   IRONPCTSAT 25 12/18/2015 1218   Lipid Panel     Component Value Date/Time   CHOL 181 03/17/2017 1220   TRIG 166 (H) 03/17/2017 1220   HDL 44 03/17/2017 1220   CHOLHDL 3.5 10/22/2016 1457   CHOLHDL 3.6 12/08/2015 1037   VLDL 33 (H) 12/08/2015 1037   LDLCALC 104 (H) 03/17/2017 1220   Hepatic Function Panel     Component Value Date/Time   PROT 7.1 03/17/2017 1220   ALBUMIN 4.5 03/17/2017 1220   AST 55 (H) 03/17/2017 1220   ALT 64 (H) 03/17/2017 1220   ALKPHOS 94 03/17/2017 1220   BILITOT 0.4 03/17/2017 1220      Component Value Date/Time   TSH 1.800 03/17/2017 1220   TSH 2.220 10/22/2016 1457   TSH 1.75 12/08/2015 1037    ASSESSMENT AND PLAN: Vitamin D deficiency - Plan: Vitamin D, Ergocalciferol, (DRISDOL) 50000 units CAPS capsule  Type 2 diabetes mellitus without complication, without long-term current use of insulin (HCC)  Class 1 obesity with serious comorbidity and body mass index (BMI) of 30.0 to 30.9 in adult, unspecified obesity type - Starting BMI greater then 30  PLAN:  Vitamin D Deficiency Sherrilynn was informed that low vitamin D levels contributes to fatigue and are associated with obesity, breast, and colon cancer. She  agrees to continue to take prescription Vit D _0 ,000 IU every week #4 with no refills and will follow up for routine testing of vitamin D, at least 2-3 times per year. She was informed of the risk of over-replacement of vitamin D and agrees to not increase her dose unless he discusses this with [place] first. Ciana agrees to follow up with our clinic in 2 weeks.  Diabetes II Shany has been given extensive diabetes education by myself today including ideal fasting and post-prandial blood glucose readings, individual ideal Hgb A1c goals  and hypoglycemia prevention. We discussed the importance of good blood sugar control to decrease the likelihood of diabetic complications such as nephropathy, neuropathy, limb loss, blindness, coronary artery disease, and death. We discussed the importance of intensive lifestyle modification including diet, exercise and weight loss as the first line treatment for diabetes. Lessa agrees to continue with diet and exercise and will follow up at the agreed upon time.  Obesity Tamaka is currently in the action stage of change. As such, her goal is to continue with weight loss efforts She has agreed to keep a food journal with 500 calories and 40+ grams of protein at supper daily and follow the Pescatarian eating plan. Lousie has been instructed to work up to a goal of 150 minutes of combined cardio and strengthening exercise per week for weight loss and overall health benefits. We discussed the following Behavioral Modification Strategies today: increasing lean protein intake and work on meal planning and easy cooking plans  Jillann has agreed to follow up with our clinic in 2 weeks. She was informed of the importance of frequent follow up visits to maximize her success with intensive lifestyle modifications for  her multiple health conditions.  I, Doreene Nest, am acting as transcriptionist for Lacy Duverney, PA-C  I have reviewed the above documentation for accuracy and  completeness, and I agree with the above. -Lacy Duverney, PA-C  I have reviewed the above note and agree with the plan. -Dennard Nip, MD   OBESITY BEHAVIORAL INTERVENTION VISIT  Today's visit was # 4 out of 22.  Starting weight: 173 lbs Starting date: 03/17/17 Today's weight : 156 lbs  Today's date: 04/29/2017 Total lbs lost to date: 17 (Patients must lose 7 lbs in the first 6 months to continue with counseling)   ASK: We discussed the diagnosis of obesity with [name] today and Leilani agreed to give [place] permission to discuss obesity behavioral modification therapy today.  ASSESS: Janicia has the diagnosis of obesity and her BMI today is 29.49 Anagha is in the action stage of change   ADVISE: Samiyyah was educated on the multiple health risks of obesity as well as the benefit of weight loss to improve her health. She was advised of the need for long term treatment and the importance of lifestyle modifications.  AGREE: Multiple dietary modification options and treatment options were discussed and  Shaneil agreed to keep a food journal with 500 calories and 40+ grams of protein at supper daily and follow the Pescatarian eating plan We discussed the following Behavioral Modification Strategies today: increasing lean protein intake and work on meal planning and easy cooking plans

## 2017-05-06 DIAGNOSIS — Z1289 Encounter for screening for malignant neoplasm of other sites: Secondary | ICD-10-CM | POA: Diagnosis not present

## 2017-05-06 DIAGNOSIS — Z6829 Body mass index (BMI) 29.0-29.9, adult: Secondary | ICD-10-CM | POA: Diagnosis not present

## 2017-05-09 ENCOUNTER — Encounter: Payer: Self-pay | Admitting: Physician Assistant

## 2017-05-09 DIAGNOSIS — N951 Menopausal and female climacteric states: Secondary | ICD-10-CM | POA: Insufficient documentation

## 2017-05-09 NOTE — Progress Notes (Signed)
Patient seen by Dr. Vania Rea at [facility] Diagnosed with menopausal syndrome with minimal symptoms  Pelvic support was very good, some symptoms secondary to heavy work, but no anatomical issues at that time

## 2017-05-13 ENCOUNTER — Ambulatory Visit (INDEPENDENT_AMBULATORY_CARE_PROVIDER_SITE_OTHER): Payer: Medicare Other | Admitting: Physician Assistant

## 2017-05-13 VITALS — BP 133/75 | HR 54 | Temp 97.8°F | Ht 61.0 in | Wt 156.0 lb

## 2017-05-13 DIAGNOSIS — E119 Type 2 diabetes mellitus without complications: Secondary | ICD-10-CM

## 2017-05-13 DIAGNOSIS — E669 Obesity, unspecified: Secondary | ICD-10-CM

## 2017-05-13 DIAGNOSIS — E559 Vitamin D deficiency, unspecified: Secondary | ICD-10-CM

## 2017-05-13 DIAGNOSIS — Z6830 Body mass index (BMI) 30.0-30.9, adult: Secondary | ICD-10-CM

## 2017-05-13 MED ORDER — VITAMIN D (ERGOCALCIFEROL) 1.25 MG (50000 UNIT) PO CAPS: 50000.0000 [IU] | ORAL_CAPSULE | ORAL | 0 refills | Status: DC

## 2017-05-13 NOTE — Progress Notes (Signed)
Office: [phone]  /  Fax: [phone]   HPI:   Chief Complaint: OBESITY Carly Walker is here to discuss her progress with her obesity treatment plan. She is on the Pescatarian eating plan and is following her eating plan approximately 70 % of the time. She states she is walking for 90 minutes 3 to 4 times per week. Early maintained her weight and she continues to be mindful of her eating and controls her portions. She would like more variety with her meals. Her weight is 156 lb (70.8 kg) today and has maintained her weight over a period of 2 weeks since her last visit. She has lost 17 lbs since starting treatment with [place].  Vitamin D deficiency Carly Walker has a diagnosis of vitamin D deficiency. She is currently taking vit D and denies nausea, vomiting or muscle weakness.  Diabetes II Carly Walker has a diagnosis of diabetes type II and is not currently on medications. Carly Walker states fasting BGs range between 90 and 110's and post prandial range between 90's and 150's and denies any hypoglycemic episodes. Last A1c was at 7.1 She has been working on intensive lifestyle modifications including diet, exercise, and weight loss to help control her blood glucose levels.  ALLERGIES: Allergies  Allergen Reactions  . Ace Inhibitors Cough  . Codeine Nausea Only  . Hydrocodone   . Sulfa Antibiotics Hives    MEDICATIONS: Current Outpatient Medications on File Prior to Visit  Medication Sig Dispense Refill  . acyclovir (ZOVIRAX) 400 MG tablet Take 800 mg by mouth 2 (two) times daily. Ophthalmic HSV 1     . amLODipine (NORVASC) 10 MG tablet Take 1 tablet daily 90 tablet 3  . aspirin 81 MG tablet Take 81 mg by mouth daily.    . Blood Glucose Monitoring Suppl (ONETOUCH VERIO) w/Device KIT 1 kit by Does not apply route 2 (two) times daily. 1 kit 0  . cetirizine (ZYRTEC) 10 MG tablet Take 10 mg by mouth daily.    Marland Kitchen glucose blood (ONETOUCH VERIO) test strip Test blood sugars twice daily 100 each 0  .  hydrochlorothiazide (MICROZIDE) 12.5 MG capsule Take 1 capsule (12.5 mg total) by mouth daily. 90 capsule 3  . ketotifen (ZADITOR) 0.025 % ophthalmic solution 1 drop 2 (two) times daily.     . Multiple Vitamin (MULTIVITAMIN) tablet Take 1 tablet by mouth daily. Over the counter, for over 50 individuals( called 50+)     . omeprazole (PRILOSEC OTC) 20 MG tablet Take 20 mg by mouth daily.    Carly Walker DELICA LANCETS 92E MISC 1 Bottle by Does not apply route 2 (two) times daily. 60 each 0  . pravastatin (PRAVACHOL) 40 MG tablet Take 1 tablet (40 mg total) by mouth daily. 90 tablet 3  . Vitamin D, Ergocalciferol, (DRISDOL) 50000 units CAPS capsule Take 1 capsule (50,000 Units total) by mouth every 7 (seven) days. 4 capsule 0   No current facility-administered medications on file prior to visit.     PAST MEDICAL HISTORY: Past Medical History:  Diagnosis Date  . Allergic rhinitis, cause unspecified   . Allergy    SEASONAL  . Arthritis    LOWER BACK  . Back pain   . Cancer [facility])    ENDOMETRIAL CANCER  . Cancer ([place])   . Chest pain   . Colon polyps 01/23/2006   type unknown   . Constipation   . Depression   . Essential hypertension, benign   . Fatty liver   . GERD (  gastroesophageal reflux disease)   . History of endometrial cancer 07/2009  . HTN (hypertension)   . Hypercholesteremia   . IBS (irritable bowel syndrome)   . Joint pain   . Leg edema   . Microscopic hematuria   . Migraine   . Mixed hyperlipidemia   . Ophthalmic herpes simplex   . Palpitations   . Proteinuria   . Tremor, essential   . Vitamin D deficiency     PAST SURGICAL HISTORY: Past Surgical History:  Procedure Laterality Date  . ABDOMINAL HYSTERECTOMY    . APPENDECTOMY    . CESAREAN SECTION    . CYST REMOVED FROM R WRIST    . HERNIA REPAIR  03/2010  . REPEAT CESAREAN SECTION    . TUBAL LIGATION    . WRIST FRACTURE SURGERY Left 07/31/2015    SOCIAL HISTORY: Social History   Tobacco Use  . Smoking  status: Never Smoker  . Smokeless tobacco: Never Used  Substance Use Topics  . Alcohol use: No    Alcohol/week: 0.0 oz  . Drug use: No    FAMILY HISTORY: Family History  Problem Relation Age of Onset  . Breast cancer Mother   . Emphysema Father   . Diabetes Father   . Heart disease Father   . Alcoholism Father   . Heart disease Paternal Grandmother   . Diabetes Brother   . Heart disease Brother     ROS: Review of Systems  Constitutional: Negative for weight loss.  Gastrointestinal: Negative for nausea and vomiting.  Musculoskeletal:       Negative muscle weakness  Endo/Heme/Allergies:       Negative hypoglycemia    PHYSICAL EXAM: Blood pressure 133/75, pulse (!) 54, temperature 97.8 F (36.6 C), temperature source Oral, height '5\' 1"'  (1.549 m), weight 156 lb (70.8 kg), SpO2 97 %. Body mass index is 29.48 kg/m. Physical Exam  Constitutional: She is oriented to person, place, and time. She appears well-developed and well-nourished.  Cardiovascular:  Bradycardic  Pulmonary/Chest: Effort normal.  Musculoskeletal: Normal range of motion.  Neurological: She is oriented to person, place, and time.  Skin: Skin is warm and dry.  Psychiatric: She has a normal mood and affect. Her behavior is normal.  Vitals reviewed.   RECENT LABS AND TESTS: BMET    Component Value Date/Time   NA 145 (H) 03/17/2017 1220   K 3.9 03/17/2017 1220   CL 100 03/17/2017 1220   CO2 26 03/17/2017 1220   GLUCOSE 116 (H) 03/17/2017 1220   GLUCOSE 102 (H) 12/08/2015 1037   BUN 14 03/17/2017 1220   CREATININE 0.83 03/17/2017 1220   CREATININE 0.82 12/08/2015 1037   CALCIUM 9.7 03/17/2017 1220   GFRNONAA 74 03/17/2017 1220   GFRNONAA 76 12/08/2015 1037   GFRAA 86 03/17/2017 1220   GFRAA 88 12/08/2015 1037   Lab Results  Component Value Date   HGBA1C 7.1 (H) 03/17/2017   HGBA1C 6.3 (H) 12/08/2015   HGBA1C 5.9 10/01/2013   HGBA1C 6.0 10/27/2012   Lab Results  Component Value Date    INSULIN 30.9 (H) 03/17/2017   CBC    Component Value Date/Time   WBC 6.2 03/17/2017 1220   WBC 7.1 12/08/2015 1058   WBC 5.1 09/15/2014 0942   RBC 5.11 03/17/2017 1220   RBC 5.11 12/08/2015 1058   RBC 4.90 09/15/2014 0942   HGB 14.7 03/17/2017 1220   HCT 44.0 03/17/2017 1220   PLT 297 10/22/2016 1457   MCV 86 03/17/2017 1220  MCH 28.8 03/17/2017 1220   MCH 31.1 12/08/2015 1058   MCH 30.4 09/15/2014 0942   MCHC 33.4 03/17/2017 1220   MCHC 35.9 (A) 12/08/2015 1058   MCHC 34.5 09/15/2014 0942   RDW 14.6 03/17/2017 1220   LYMPHSABS 2.6 03/17/2017 1220   MONOABS 0.4 09/15/2014 0942   EOSABS 0.2 03/17/2017 1220   BASOSABS 0.0 03/17/2017 1220   Iron/TIBC/Ferritin/ %Sat    Component Value Date/Time   IRON 97 12/18/2015 1218   TIBC 394 12/18/2015 1218   FERRITIN 31 12/18/2015 1218   IRONPCTSAT 25 12/18/2015 1218   Lipid Panel     Component Value Date/Time   CHOL 181 03/17/2017 1220   TRIG 166 (H) 03/17/2017 1220   HDL 44 03/17/2017 1220   CHOLHDL 3.5 10/22/2016 1457   CHOLHDL 3.6 12/08/2015 1037   VLDL 33 (H) 12/08/2015 1037   LDLCALC 104 (H) 03/17/2017 1220   Hepatic Function Panel     Component Value Date/Time   PROT 7.1 03/17/2017 1220   ALBUMIN 4.5 03/17/2017 1220   AST 55 (H) 03/17/2017 1220   ALT 64 (H) 03/17/2017 1220   ALKPHOS 94 03/17/2017 1220   BILITOT 0.4 03/17/2017 1220      Component Value Date/Time   TSH 1.800 03/17/2017 1220   TSH 2.220 10/22/2016 1457   TSH 1.75 12/08/2015 1037    ASSESSMENT AND PLAN: Type 2 diabetes mellitus without complication, without long-term current use of insulin (HCC)  Vitamin D deficiency - Plan: Vitamin D, Ergocalciferol, (DRISDOL) 50000 units CAPS capsule  Class 1 obesity with serious comorbidity and body mass index (BMI) of 30.0 to 30.9 in adult, unspecified obesity type - starting BMI greater then 30  PLAN:  Vitamin D Deficiency Carly Walker was informed that low vitamin D levels contributes to fatigue and are  associated with obesity, breast, and colon cancer. She agrees to continue to take prescription Vit D '@50' ,000 IU every week #4 with no refills and will follow up for routine testing of vitamin D, at least 2-3 times per year. She was informed of the risk of over-replacement of vitamin D and agrees to not increase her dose unless he discusses this with [place] first. Carly Walker agrees to follow up with our clinic in 2 weeks.  Diabetes II Carly Walker has been given extensive diabetes education by myself today including ideal fasting and post-prandial blood glucose readings, individual ideal Hgb A1c goals  and hypoglycemia prevention. We discussed the importance of good blood sugar control to decrease the likelihood of diabetic complications such as nephropathy, neuropathy, limb loss, blindness, coronary artery disease, and death. We discussed the importance of intensive lifestyle modification including diet, exercise and weight loss as the first line treatment for diabetes. Carly Walker declines medications and she agrees to continue with diet, exercise and weight loss. Carly Walker agrees to follow up at the agreed upon time.   Obesity Carly Walker is currently in the action stage of change. As such, her goal is to continue with weight loss efforts She has agreed to keep a food journal with 1100 to 1300 calories and 90 grams of protein daily Carly Walker has been instructed to work up to a goal of 150 minutes of combined cardio and strengthening exercise per week for weight loss and overall health benefits. We discussed the following Behavioral Modification Strategies today: increasing lean protein intake and keep a strict food journal  Carly Walker has agreed to follow up with our clinic in 2 weeks. She was informed of the importance of frequent follow  up visits to maximize her success with intensive lifestyle modifications for her multiple health conditions.  I, Doreene Nest, am acting as transcriptionist for Lacy Duverney, PA-C  I have  reviewed the above documentation for accuracy and completeness, and I agree with the above. -Lacy Duverney, PA-C  I have reviewed the above note and agree with the plan. -Dennard Nip, MD   OBESITY BEHAVIORAL INTERVENTION VISIT  Today's visit was # 5 out of 22.  Starting weight: 173 lbs Starting date: 03/17/17 Today's weight : 156 lbs  Today's date: 05/13/2017 Total lbs lost to date: 17 (Patients must lose 7 lbs in the first 6 months to continue with counseling)   ASK: We discussed the diagnosis of obesity with [name] today and Jalaysia agreed to give [place] permission to discuss obesity behavioral modification therapy today.  ASSESS: Dariane has the diagnosis of obesity and her BMI today is 29.49 Ebany is in the action stage of change   ADVISE: Annitta was educated on the multiple health risks of obesity as well as the benefit of weight loss to improve her health. She was advised of the need for long term treatment and the importance of lifestyle modifications.  AGREE: Multiple dietary modification options and treatment options were discussed and  Lolah agreed to keep a food journal with 1100 to 1300 calories and 90 grams of protein daily We discussed the following Behavioral Modification Strategies today: increasing lean protein intake and keep a strict food journal

## 2017-05-15 ENCOUNTER — Encounter: Payer: Self-pay | Admitting: Physician Assistant

## 2017-05-15 DIAGNOSIS — C44622 Squamous cell carcinoma of skin of right upper limb, including shoulder: Secondary | ICD-10-CM | POA: Diagnosis not present

## 2017-05-25 ENCOUNTER — Other Ambulatory Visit (INDEPENDENT_AMBULATORY_CARE_PROVIDER_SITE_OTHER): Payer: Self-pay | Admitting: Family Medicine

## 2017-05-25 DIAGNOSIS — E119 Type 2 diabetes mellitus without complications: Secondary | ICD-10-CM

## 2017-05-27 ENCOUNTER — Ambulatory Visit (INDEPENDENT_AMBULATORY_CARE_PROVIDER_SITE_OTHER): Payer: Medicare Other | Admitting: Physician Assistant

## 2017-05-27 ENCOUNTER — Encounter: Payer: Self-pay | Admitting: Physician Assistant

## 2017-05-27 VITALS — BP 109/73 | HR 65 | Temp 97.6°F | Ht 61.0 in | Wt 152.0 lb

## 2017-05-27 DIAGNOSIS — C44622 Squamous cell carcinoma of skin of right upper limb, including shoulder: Secondary | ICD-10-CM | POA: Insufficient documentation

## 2017-05-27 DIAGNOSIS — I10 Essential (primary) hypertension: Secondary | ICD-10-CM

## 2017-05-27 DIAGNOSIS — E669 Obesity, unspecified: Secondary | ICD-10-CM

## 2017-05-27 DIAGNOSIS — Z6830 Body mass index (BMI) 30.0-30.9, adult: Secondary | ICD-10-CM | POA: Diagnosis not present

## 2017-05-27 DIAGNOSIS — E119 Type 2 diabetes mellitus without complications: Secondary | ICD-10-CM | POA: Diagnosis not present

## 2017-05-27 MED ORDER — ONETOUCH DELICA LANCETS 33G MISC: 1.0000 | Freq: Two times a day (BID) | 0 refills | Status: AC

## 2017-05-27 NOTE — Progress Notes (Signed)
Office: [phone]  /  Fax: [phone]   HPI:   Chief Complaint: OBESITY Carly Walker is here to discuss her progress with her obesity treatment plan. She is on the keep a food journal with 1100 to 1300 calories and 90 grams of protein daily and is following her eating plan approximately 70 % of the time. She states she is walking 20 minutes 4 times per week. Carly Walker continues to do well with weight loss. She states her hunger is well controlled. She would like holiday eating strategies. Her weight is 152 lb (68.9 kg) today and has had a weight loss of 4 pounds over a period of 2 weeks since her last visit. She has lost 21 lbs since starting treatment with [place].  Diabetes II Carly Walker has a diagnosis of diabetes type II. Carly Walker states fasting BGs range between 110's and 130's and postprandial BGs range between 110's and 140's and she denies any hypoglycemic episodes. She has been working on intensive lifestyle modifications including diet, exercise, and weight loss to help control her blood glucose levels.  Hypertension Carly Walker is a 66 y.o. female with hypertension.  Carly Walker denies chest pain or shortness of breath on exertion. She is working weight loss to help control her blood pressure with the goal of decreasing her risk of heart attack and stroke. Carly Walker blood pressure is currently stable.  ALLERGIES: Allergies  Allergen Reactions  . Ace Inhibitors Cough  . Codeine Nausea Only  . Hydrocodone   . Sulfa Antibiotics Hives    MEDICATIONS: Current Outpatient Medications on File Prior to Visit  Medication Sig Dispense Refill  . acyclovir (ZOVIRAX) 400 MG tablet Take 800 mg by mouth 2 (two) times daily. Ophthalmic HSV 1     . amLODipine (NORVASC) 10 MG tablet Take 1 tablet daily 90 tablet 3  . aspirin 81 MG tablet Take 81 mg by mouth daily.    . Blood Glucose Monitoring Suppl (ONETOUCH VERIO) w/Device KIT 1 kit by Does not apply route 2 (two) times daily. 1 kit 0  .  cetirizine (ZYRTEC) 10 MG tablet Take 10 mg by mouth daily.    Marland Kitchen glucose blood (ONETOUCH VERIO) test strip Test blood sugars twice daily 100 each 0  . hydrochlorothiazide (MICROZIDE) 12.5 MG capsule Take 1 capsule (12.5 mg total) by mouth daily. 90 capsule 3  . ketotifen (ZADITOR) 0.025 % ophthalmic solution 1 drop 2 (two) times daily.     . Multiple Vitamin (MULTIVITAMIN) tablet Take 1 tablet by mouth daily. Over the counter, for over 50 individuals( called 50+)     . omeprazole (PRILOSEC OTC) 20 MG tablet Take 20 mg by mouth daily.    Carly Walker DELICA LANCETS 72C MISC 1 Bottle by Does not apply route 2 (two) times daily. 60 each 0  . pravastatin (PRAVACHOL) 40 MG tablet Take 1 tablet (40 mg total) by mouth daily. 90 tablet 3  . Vitamin D, Ergocalciferol, (DRISDOL) 50000 units CAPS capsule Take 1 capsule (50,000 Units total) every 7 (seven) days by mouth. 4 capsule 0   No current facility-administered medications on file prior to visit.     PAST MEDICAL HISTORY: Past Medical History:  Diagnosis Date  . Allergic rhinitis, cause unspecified   . Allergy    SEASONAL  . Arthritis    LOWER BACK  . Back pain   . Cancer [facility])    ENDOMETRIAL CANCER  . Cancer ([place])   . Chest pain   . Colon polyps  01/23/2006   type unknown   . Constipation   . Depression   . Essential hypertension, benign   . Fatty liver   . GERD (gastroesophageal reflux disease)   . History of endometrial cancer 07/2009  . HTN (hypertension)   . Hypercholesteremia   . IBS (irritable bowel syndrome)   . Joint pain   . Leg edema   . Microscopic hematuria   . Migraine   . Mixed hyperlipidemia   . Ophthalmic herpes simplex   . Palpitations   . Proteinuria   . Tremor, essential   . Vitamin D deficiency     PAST SURGICAL HISTORY: Past Surgical History:  Procedure Laterality Date  . ABDOMINAL HYSTERECTOMY    . APPENDECTOMY    . CESAREAN SECTION    . CYST REMOVED FROM R WRIST    . HERNIA REPAIR  03/2010  .  REPEAT CESAREAN SECTION    . TUBAL LIGATION    . WRIST FRACTURE SURGERY Left 07/31/2015    SOCIAL HISTORY: Social History   Tobacco Use  . Smoking status: Never Smoker  . Smokeless tobacco: Never Used  Substance Use Topics  . Alcohol use: No    Alcohol/week: 0.0 oz  . Drug use: No    FAMILY HISTORY: Family History  Problem Relation Age of Onset  . Breast cancer Mother   . Emphysema Father   . Diabetes Father   . Heart disease Father   . Alcoholism Father   . Heart disease Paternal Grandmother   . Diabetes Brother   . Heart disease Brother     ROS: Review of Systems  Constitutional: Positive for weight loss.  Respiratory: Negative for shortness of breath (on exertion).   Cardiovascular: Negative for chest pain.  Endo/Heme/Allergies:       Negative hypoglycemia    PHYSICAL EXAM: Blood pressure 109/73, pulse 65, temperature 97.6 F (36.4 C), temperature source Oral, height '5\' 1"'  (1.549 m), weight 152 lb (68.9 kg), SpO2 98 %. Body mass index is 28.72 kg/m. Physical Exam  Constitutional: She is oriented to person, place, and time. She appears well-developed and well-nourished.  Cardiovascular: Normal rate.  Pulmonary/Chest: Effort normal.  Musculoskeletal: Normal range of motion.  Neurological: She is oriented to person, place, and time.  Skin: Skin is warm and dry.  Psychiatric: She has a normal mood and affect. Her behavior is normal.  Vitals reviewed.   RECENT LABS AND TESTS: BMET    Component Value Date/Time   NA 145 (H) 03/17/2017 1220   K 3.9 03/17/2017 1220   CL 100 03/17/2017 1220   CO2 26 03/17/2017 1220   GLUCOSE 116 (H) 03/17/2017 1220   GLUCOSE 102 (H) 12/08/2015 1037   BUN 14 03/17/2017 1220   CREATININE 0.83 03/17/2017 1220   CREATININE 0.82 12/08/2015 1037   CALCIUM 9.7 03/17/2017 1220   GFRNONAA 74 03/17/2017 1220   GFRNONAA 76 12/08/2015 1037   GFRAA 86 03/17/2017 1220   GFRAA 88 12/08/2015 1037   Lab Results  Component Value Date    HGBA1C 7.1 (H) 03/17/2017   HGBA1C 6.3 (H) 12/08/2015   HGBA1C 5.9 10/01/2013   HGBA1C 6.0 10/27/2012   Lab Results  Component Value Date   INSULIN 30.9 (H) 03/17/2017   CBC    Component Value Date/Time   WBC 6.2 03/17/2017 1220   WBC 7.1 12/08/2015 1058   WBC 5.1 09/15/2014 0942   RBC 5.11 03/17/2017 1220   RBC 5.11 12/08/2015 1058   RBC 4.90 09/15/2014  0942   HGB 14.7 03/17/2017 1220   HCT 44.0 03/17/2017 1220   PLT 297 10/22/2016 1457   MCV 86 03/17/2017 1220   MCH 28.8 03/17/2017 1220   MCH 31.1 12/08/2015 1058   MCH 30.4 09/15/2014 0942   MCHC 33.4 03/17/2017 1220   MCHC 35.9 (A) 12/08/2015 1058   MCHC 34.5 09/15/2014 0942   RDW 14.6 03/17/2017 1220   LYMPHSABS 2.6 03/17/2017 1220   MONOABS 0.4 09/15/2014 0942   EOSABS 0.2 03/17/2017 1220   BASOSABS 0.0 03/17/2017 1220   Iron/TIBC/Ferritin/ %Sat    Component Value Date/Time   IRON 97 12/18/2015 1218   TIBC 394 12/18/2015 1218   FERRITIN 31 12/18/2015 1218   IRONPCTSAT 25 12/18/2015 1218   Lipid Panel     Component Value Date/Time   CHOL 181 03/17/2017 1220   TRIG 166 (H) 03/17/2017 1220   HDL 44 03/17/2017 1220   CHOLHDL 3.5 10/22/2016 1457   CHOLHDL 3.6 12/08/2015 1037   VLDL 33 (H) 12/08/2015 1037   LDLCALC 104 (H) 03/17/2017 1220   Hepatic Function Panel     Component Value Date/Time   PROT 7.1 03/17/2017 1220   ALBUMIN 4.5 03/17/2017 1220   AST 55 (H) 03/17/2017 1220   ALT 64 (H) 03/17/2017 1220   ALKPHOS 94 03/17/2017 1220   BILITOT 0.4 03/17/2017 1220      Component Value Date/Time   TSH 1.800 03/17/2017 1220   TSH 2.220 10/22/2016 1457   TSH 1.75 12/08/2015 1037    ASSESSMENT AND PLAN: Type 2 diabetes mellitus without complication, without long-term current use of insulin ([place]) - Plan: ONETOUCH DELICA LANCETS 01X MISC  Essential hypertension  Class 1 obesity with serious comorbidity and body mass index (BMI) of 30.0 to 30.9 in adult, unspecified obesity type - Starting BMI  greater then 30  PLAN:  Diabetes II Carly Walker has been given extensive diabetes education by myself today including ideal fasting and post-prandial blood glucose readings, individual ideal Hgb A1c goals  and hypoglycemia prevention. We discussed the importance of good blood sugar control to decrease the likelihood of diabetic complications such as nephropathy, neuropathy, limb loss, blindness, coronary artery disease, and death. We discussed the importance of intensive lifestyle modification including diet, exercise and weight loss as the first line treatment for diabetes. We will refill lancets and test strips and Carly Walker agrees to follow up at the agreed upon time.  Hypertension We discussed sodium restriction, working on healthy weight loss, and a regular exercise program as the means to achieve improved blood pressure control. Carly Walker agreed with this plan and agreed to follow up as directed. We will continue to monitor her blood pressure as well as her progress with the above lifestyle modifications. She will continue her medications as prescribed and will watch for signs of hypotension as she continues her lifestyle modifications.  Obesity Carly Walker is currently in the action stage of change. As such, her goal is to continue with weight loss efforts She has agreed to keep a food journal with 1100 to 1300 calories and 90 grams of protein daily Carly Walker has been instructed to work up to a goal of 150 minutes of combined cardio and strengthening exercise per week for weight loss and overall health benefits. We discussed the following Behavioral Modification Strategies today: increasing lean protein intake and holiday eating strategies   Carly Walker has agreed to follow up with our clinic in 3 weeks. She was informed of the importance of frequent follow up visits to  maximize her success with intensive lifestyle modifications for her multiple health conditions.  I, Doreene Nest, am acting as  transcriptionist for Lacy Duverney, PA-C  I have reviewed the above documentation for accuracy and completeness, and I agree with the above. -Lacy Duverney, PA-C  I have reviewed the above note and agree with the plan. -Dennard Nip, MD   OBESITY BEHAVIORAL INTERVENTION VISIT  Today's visit was # 6 out of 22.  Starting weight: 173 lbs Starting date: 03/17/17 Today's weight : 152 lbs  Today's date: 05/27/2017 Total lbs lost to date: 21 (Patients must lose 7 lbs in the first 6 months to continue with counseling)   ASK: We discussed the diagnosis of obesity with Carly Walker today and Carly Walker agreed to give [place] permission to discuss obesity behavioral modification therapy today.  ASSESS: Carly Walker has the diagnosis of obesity and her BMI today is 28.74 Carly Walker is in the action stage of change   ADVISE: Carly Walker was educated on the multiple health risks of obesity as well as the benefit of weight loss to improve her health. She was advised of the need for long term treatment and the importance of lifestyle modifications.  AGREE: Multiple dietary modification options and treatment options were discussed and  Carly Walker agreed to keep a food journal with 1100 to 1300 calories and 90 grams of protein daily We discussed the following Behavioral Modification Strategies today: increasing lean protein intake and holiday eating strategies

## 2017-05-28 ENCOUNTER — Encounter (INDEPENDENT_AMBULATORY_CARE_PROVIDER_SITE_OTHER): Payer: Self-pay | Admitting: Physician Assistant

## 2017-05-28 MED ORDER — GLUCOSE BLOOD VI STRP: ORAL_STRIP | 0 refills | Status: DC

## 2017-06-17 ENCOUNTER — Ambulatory Visit (INDEPENDENT_AMBULATORY_CARE_PROVIDER_SITE_OTHER): Payer: Medicare Other | Admitting: Physician Assistant

## 2017-06-17 ENCOUNTER — Encounter (INDEPENDENT_AMBULATORY_CARE_PROVIDER_SITE_OTHER): Payer: Self-pay

## 2017-06-19 ENCOUNTER — Ambulatory Visit (INDEPENDENT_AMBULATORY_CARE_PROVIDER_SITE_OTHER): Payer: Medicare Other | Admitting: Family Medicine

## 2017-06-19 VITALS — BP 147/79 | HR 57 | Temp 97.7°F | Ht 61.0 in | Wt 155.0 lb

## 2017-06-19 DIAGNOSIS — E669 Obesity, unspecified: Secondary | ICD-10-CM

## 2017-06-19 DIAGNOSIS — Z6830 Body mass index (BMI) 30.0-30.9, adult: Secondary | ICD-10-CM | POA: Diagnosis not present

## 2017-06-19 DIAGNOSIS — E1165 Type 2 diabetes mellitus with hyperglycemia: Secondary | ICD-10-CM | POA: Diagnosis not present

## 2017-06-19 DIAGNOSIS — E559 Vitamin D deficiency, unspecified: Secondary | ICD-10-CM

## 2017-06-19 MED ORDER — VITAMIN D (ERGOCALCIFEROL) 1.25 MG (50000 UNIT) PO CAPS: 50000.0000 [IU] | ORAL_CAPSULE | ORAL | 0 refills | Status: DC

## 2017-06-19 MED ORDER — METFORMIN HCL 500 MG PO TABS: 500.0000 mg | ORAL_TABLET | Freq: Every day | ORAL | 0 refills | Status: DC

## 2017-06-19 NOTE — Progress Notes (Signed)
Office: [phone]  /  Fax: [phone]   HPI:   Chief Complaint: OBESITY Carly Walker is here to discuss her progress with her obesity treatment plan. She is on the keep a food journal with 1100 to 1300 calories and 90 grams of protein daily and is following her eating plan approximately 30 % of the time. She states she is walking dogs for 30 to 150 minutes 5 times per week. Carly Walker has gotten off track with eating, but she has increased exercise. Carly Walker is not planning meals ahead of time and she increased temptations. Her weight is 155 lb (70.3 kg) today and has had a weight gain of 3 pounds over a period of 3 weeks since her last visit. She has lost 18 lbs since starting treatment with [place].  Vitamin D deficiency Carly Walker has a diagnosis of vitamin D deficiency. Carly Walker is stable on vit D, but not yet at goal and she denies nausea, vomiting or muscle weakness.  Diabetes II Carly Walker has a diagnosis of diabetes type II. Carly Walker states fasting BGs range between 98 and 136 and denies any hypoglycemic episodes. She has been working on intensive lifestyle modifications including diet, exercise, and weight loss to control her blood glucose levels but is struggling with polyphagia. Last A1c uncontrolled at 7.1  ALLERGIES: Allergies  Allergen Reactions  . Ace Inhibitors Cough  . Codeine Nausea Only  . Hydrocodone   . Sulfa Antibiotics Hives    MEDICATIONS: Current Outpatient Medications on File Prior to Visit  Medication Sig Dispense Refill  . acyclovir (ZOVIRAX) 400 MG tablet Take 800 mg by mouth 2 (two) times daily. Ophthalmic HSV 1     . amLODipine (NORVASC) 10 MG tablet Take 1 tablet daily 90 tablet 3  . aspirin 81 MG tablet Take 81 mg by mouth daily.    . Blood Glucose Monitoring Suppl (ONETOUCH VERIO) w/Device KIT 1 kit by Does not apply route 2 (two) times daily. 1 kit 0  . cetirizine (ZYRTEC) 10 MG tablet Take 10 mg by mouth daily.    Marland Kitchen glucose blood (ONETOUCH VERIO) test strip  Test blood sugars twice daily 100 each 0  . hydrochlorothiazide (MICROZIDE) 12.5 MG capsule Take 1 capsule (12.5 mg total) by mouth daily. 90 capsule 3  . ketotifen (ZADITOR) 0.025 % ophthalmic solution 1 drop 2 (two) times daily.     . Multiple Vitamin (MULTIVITAMIN) tablet Take 1 tablet by mouth daily. Over the counter, for over 50 individuals( called 50+)     . omeprazole (PRILOSEC OTC) 20 MG tablet Take 20 mg by mouth daily.    Carly Walker DELICA LANCETS 80H MISC 1 Bottle by Does not apply route 2 (two) times daily. 60 each 0  . pravastatin (PRAVACHOL) 40 MG tablet Take 1 tablet (40 mg total) by mouth daily. 90 tablet 3   No current facility-administered medications on file prior to visit.     PAST MEDICAL HISTORY: Past Medical History:  Diagnosis Date  . Allergic rhinitis, cause unspecified   . Allergy    SEASONAL  . Arthritis    LOWER BACK  . Back pain   . Cancer [facility])    ENDOMETRIAL CANCER  . Cancer ([place])   . Chest pain   . Colon polyps 01/23/2006   type unknown   . Constipation   . Depression   . Essential hypertension, benign   . Fatty liver   . GERD (gastroesophageal reflux disease)   . History of endometrial cancer 07/2009  .  HTN (hypertension)   . Hypercholesteremia   . IBS (irritable bowel syndrome)   . Joint pain   . Leg edema   . Microscopic hematuria   . Migraine   . Mixed hyperlipidemia   . Ophthalmic herpes simplex   . Palpitations   . Proteinuria   . Tremor, essential   . Vitamin D deficiency     PAST SURGICAL HISTORY: Past Surgical History:  Procedure Laterality Date  . ABDOMINAL HYSTERECTOMY    . APPENDECTOMY    . CESAREAN SECTION    . CYST REMOVED FROM R WRIST    . HERNIA REPAIR  03/2010  . REPEAT CESAREAN SECTION    . TUBAL LIGATION    . WRIST FRACTURE SURGERY Left 07/31/2015    SOCIAL HISTORY: Social History   Tobacco Use  . Smoking status: Never Smoker  . Smokeless tobacco: Never Used  Substance Use Topics  . Alcohol use: No     Alcohol/week: 0.0 oz  . Drug use: No    FAMILY HISTORY: Family History  Problem Relation Age of Onset  . Breast cancer Mother   . Emphysema Father   . Diabetes Father   . Heart disease Father   . Alcoholism Father   . Heart disease Paternal Grandmother   . Diabetes Brother   . Heart disease Brother     ROS: Review of Systems  Constitutional: Negative for weight loss.  Gastrointestinal: Negative for nausea and vomiting.  Musculoskeletal:       Negative muscle weakness  Endo/Heme/Allergies:       Negative hypoglycemia    PHYSICAL EXAM: Blood pressure (!) 147/79, pulse (!) 57, temperature 97.7 F (36.5 C), temperature source Oral, height '5\' 1"'  (1.549 m), weight 155 lb (70.3 kg), SpO2 97 %. Body mass index is 29.29 kg/m. Physical Exam  Constitutional: She is oriented to person, place, and time. She appears well-developed and well-nourished.  Cardiovascular: Normal rate.  Pulmonary/Chest: Effort normal.  Musculoskeletal: Normal range of motion.  Neurological: She is oriented to person, place, and time.  Skin: Skin is warm and dry.  Psychiatric: She has a normal mood and affect. Her behavior is normal.  Vitals reviewed.   RECENT LABS AND TESTS: BMET    Component Value Date/Time   NA 145 (H) 03/17/2017 1220   K 3.9 03/17/2017 1220   CL 100 03/17/2017 1220   CO2 26 03/17/2017 1220   GLUCOSE 116 (H) 03/17/2017 1220   GLUCOSE 102 (H) 12/08/2015 1037   BUN 14 03/17/2017 1220   CREATININE 0.83 03/17/2017 1220   CREATININE 0.82 12/08/2015 1037   CALCIUM 9.7 03/17/2017 1220   GFRNONAA 74 03/17/2017 1220   GFRNONAA 76 12/08/2015 1037   GFRAA 86 03/17/2017 1220   GFRAA 88 12/08/2015 1037   Lab Results  Component Value Date   HGBA1C 7.1 (H) 03/17/2017   HGBA1C 6.3 (H) 12/08/2015   HGBA1C 5.9 10/01/2013   HGBA1C 6.0 10/27/2012   Lab Results  Component Value Date   INSULIN 30.9 (H) 03/17/2017   CBC    Component Value Date/Time   WBC 6.2 03/17/2017 1220   WBC  7.1 12/08/2015 1058   WBC 5.1 09/15/2014 0942   RBC 5.11 03/17/2017 1220   RBC 5.11 12/08/2015 1058   RBC 4.90 09/15/2014 0942   HGB 14.7 03/17/2017 1220   HCT 44.0 03/17/2017 1220   PLT 297 10/22/2016 1457   MCV 86 03/17/2017 1220   MCH 28.8 03/17/2017 1220   MCH 31.1 12/08/2015 1058  MCH 30.4 09/15/2014 0942   MCHC 33.4 03/17/2017 1220   MCHC 35.9 (A) 12/08/2015 1058   MCHC 34.5 09/15/2014 0942   RDW 14.6 03/17/2017 1220   LYMPHSABS 2.6 03/17/2017 1220   MONOABS 0.4 09/15/2014 0942   EOSABS 0.2 03/17/2017 1220   BASOSABS 0.0 03/17/2017 1220   Iron/TIBC/Ferritin/ %Sat    Component Value Date/Time   IRON 97 12/18/2015 1218   TIBC 394 12/18/2015 1218   FERRITIN 31 12/18/2015 1218   IRONPCTSAT 25 12/18/2015 1218   Lipid Panel     Component Value Date/Time   CHOL 181 03/17/2017 1220   TRIG 166 (H) 03/17/2017 1220   HDL 44 03/17/2017 1220   CHOLHDL 3.5 10/22/2016 1457   CHOLHDL 3.6 12/08/2015 1037   VLDL 33 (H) 12/08/2015 1037   LDLCALC 104 (H) 03/17/2017 1220   Hepatic Function Panel     Component Value Date/Time   PROT 7.1 03/17/2017 1220   ALBUMIN 4.5 03/17/2017 1220   AST 55 (H) 03/17/2017 1220   ALT 64 (H) 03/17/2017 1220   ALKPHOS 94 03/17/2017 1220   BILITOT 0.4 03/17/2017 1220      Component Value Date/Time   TSH 1.800 03/17/2017 1220   TSH 2.220 10/22/2016 1457   TSH 1.75 12/08/2015 1037    ASSESSMENT AND PLAN: Type 2 diabetes mellitus with hyperglycemia, without long-term current use of insulin ([place]) - Plan: metFORMIN (GLUCOPHAGE) 500 MG tablet  Vitamin D deficiency - Plan: Vitamin D, Ergocalciferol, (DRISDOL) 50000 units CAPS capsule  Class 1 obesity with serious comorbidity and body mass index (BMI) of 30.0 to 30.9 in adult, unspecified obesity type - Starting BMI greater then 30  PLAN:  Vitamin D Deficiency Carly Walker was informed that low vitamin D levels contributes to fatigue and are associated with obesity, breast, and colon cancer. She  agrees to continue to take prescription Vit D '@50' ,000 IU every week #4 with no refills and will follow up for routine testing of vitamin D, at least 2-3 times per year. She was informed of the risk of over-replacement of vitamin D and agrees to not increase her dose unless he discusses this with [place] first. Carly Walker agrees to follow up with our clinic in 3 weeks.  Diabetes II Carly Walker has been given extensive diabetes education by myself today including ideal fasting and post-prandial blood glucose readings, individual ideal Hgb A1c goals and hypoglycemia prevention. We discussed the importance of good blood sugar control to decrease the likelihood of diabetic complications such as nephropathy, neuropathy, limb loss, blindness, coronary artery disease, and death. We discussed the importance of intensive lifestyle modification including diet, exercise and weight loss as the first line treatment for diabetes. Carly Walker agrees to start metformin 500 mg qam #30 with no refills and will follow up at the agreed upon time.  Obesity Carly Walker is currently in the action stage of change. As such, her goal is to continue with weight loss efforts She has agreed to keep a food journal with 1100 to 1300 calories and 75+ grams of protein daily Carly Walker has been instructed to work up to a goal of 150 minutes of combined cardio and strengthening exercise per week for weight loss and overall health benefits. We discussed the following Behavioral Modification Strategies today: work on meal planning and easy cooking plans, dealing with family or coworker sabotage and holiday eating strategies   Carly Walker has agreed to follow up with our clinic in 3 weeks. She was informed of the importance of frequent follow up  visits to maximize her success with intensive lifestyle modifications for her multiple health conditions.  I, Doreene Nest, am acting as transcriptionist for Dennard Nip, MD  I have reviewed the above documentation for  accuracy and completeness, and I agree with the above. -Dennard Nip, MD    OBESITY BEHAVIORAL INTERVENTION VISIT  Today's visit was # 7 out of 22.  Starting weight: 173 lbs Starting date: 03/17/17 Today's weight : 155 lbs Today's date: 06/19/2017 Total lbs lost to date: 36 (Patients must lose 7 lbs in the first 6 months to continue with counseling)   ASK: We discussed the diagnosis of obesity with Carly Walker today and Carly Walker agreed to give [place] permission to discuss obesity behavioral modification therapy today.  ASSESS: Carly Walker has the diagnosis of obesity and her BMI today is 29.3 Carly Walker is in the action stage of change   ADVISE: Carly Walker was educated on the multiple health risks of obesity as well as the benefit of weight loss to improve her health. She was advised of the need for long term treatment and the importance of lifestyle modifications.  AGREE: Multiple dietary modification options and treatment options were discussed and  Carly Walker agreed to keep a food journal with 1100 to 1300 calories and 75+ grams of protein daily We discussed the following Behavioral Modification Strategies today: work on meal planning and easy cooking plans, dealing with family or coworker sabotage and holiday eating strategies

## 2017-06-24 ENCOUNTER — Telehealth: Payer: Self-pay | Admitting: Physician Assistant

## 2017-06-24 MED ORDER — ATORVASTATIN CALCIUM 20 MG PO TABS
20.0000 mg | ORAL_TABLET | Freq: Every day | ORAL | 3 refills | Status: DC
Start: 2017-06-24 — End: 2018-07-16

## 2017-06-24 NOTE — Telephone Encounter (Signed)
Fax from pharmacy requesting change from pravastatin ($22 copay) to simvasatain ($2), atorvastatin ($10), lovastatin ($10) or rosuvastatin ($10).  Meds ordered this encounter  Medications  . atorvastatin (LIPITOR) 20 MG tablet    Sig: Take 1 tablet (20 mg total) by mouth daily.    Dispense:  90 tablet    Refill:  3    To replace pravastatin    Order Specific Question:   Supervising Provider    Answer:   Brigitte Pulse, EVA N [[id number]]

## 2017-07-09 DIAGNOSIS — E119 Type 2 diabetes mellitus without complications: Secondary | ICD-10-CM | POA: Diagnosis not present

## 2017-07-09 DIAGNOSIS — H04123 Dry eye syndrome of bilateral lacrimal glands: Secondary | ICD-10-CM | POA: Diagnosis not present

## 2017-07-09 DIAGNOSIS — H25813 Combined forms of age-related cataract, bilateral: Secondary | ICD-10-CM | POA: Diagnosis not present

## 2017-07-09 DIAGNOSIS — H52203 Unspecified astigmatism, bilateral: Secondary | ICD-10-CM | POA: Diagnosis not present

## 2017-07-10 ENCOUNTER — Ambulatory Visit (INDEPENDENT_AMBULATORY_CARE_PROVIDER_SITE_OTHER): Payer: Medicare Other | Admitting: Family Medicine

## 2017-07-10 VITALS — BP 113/72 | HR 68 | Temp 98.2°F | Ht 61.0 in | Wt 152.0 lb

## 2017-07-10 DIAGNOSIS — E119 Type 2 diabetes mellitus without complications: Secondary | ICD-10-CM

## 2017-07-10 DIAGNOSIS — Z6830 Body mass index (BMI) 30.0-30.9, adult: Secondary | ICD-10-CM

## 2017-07-10 DIAGNOSIS — E669 Obesity, unspecified: Secondary | ICD-10-CM | POA: Diagnosis not present

## 2017-07-10 NOTE — Progress Notes (Signed)
Office: [phone]  /  Fax: [phone]   HPI:   Chief Complaint: OBESITY Carly Walker is here to discuss her progress with her obesity treatment plan. She is on the  keep a food journal with 1100 to 1300 calories and 75+ grams of protein daily and is following her eating plan approximately 30 % of the time. She states she is doing housework for 60 minutes 1 times per week. Carly Walker continues to do well with weight loss and with journaling. She had a GI bug and wasn't able to eat, but is getting back to her eating plan. Her weight is 152 lb (68.9 kg) today and has had a weight loss of 3 pounds over a period of 3 weeks since her last visit. She has lost 21 lbs since starting treatment with [place].  Diabetes II Carly Walker has a diagnosis of diabetes type II. She is doing well with diet and metformin and she has had no GI upset  (outside of recent illness). Carly Walker denies any hypoglycemic episodes. She has been working on intensive lifestyle modifications including diet, exercise, and weight loss to help control her blood glucose levels.  ALLERGIES: Allergies  Allergen Reactions  . Ace Inhibitors Cough  . Codeine Nausea Only  . Hydrocodone   . Sulfa Antibiotics Hives    MEDICATIONS: Current Outpatient Medications on File Prior to Visit  Medication Sig Dispense Refill  . acyclovir (ZOVIRAX) 400 MG tablet Take 800 mg by mouth 2 (two) times daily. Ophthalmic HSV 1     . amLODipine (NORVASC) 10 MG tablet Take 1 tablet daily 90 tablet 3  . aspirin 81 MG tablet Take 81 mg by mouth daily.    Marland Kitchen atorvastatin (LIPITOR) 20 MG tablet Take 1 tablet (20 mg total) by mouth daily. 90 tablet 3  . Blood Glucose Monitoring Suppl (ONETOUCH VERIO) w/Device KIT 1 kit by Does not apply route 2 (two) times daily. 1 kit 0  . cetirizine (ZYRTEC) 10 MG tablet Take 10 mg by mouth daily.    Marland Kitchen glucose blood (ONETOUCH VERIO) test strip Test blood sugars twice daily 100 each 0  . hydrochlorothiazide (MICROZIDE) 12.5 MG  capsule Take 1 capsule (12.5 mg total) by mouth daily. 90 capsule 3  . ketotifen (ZADITOR) 0.025 % ophthalmic solution 1 drop 2 (two) times daily.     . metFORMIN (GLUCOPHAGE) 500 MG tablet Take 1 tablet (500 mg total) by mouth daily with breakfast. 30 tablet 0  . Multiple Vitamin (MULTIVITAMIN) tablet Take 1 tablet by mouth daily. Over the counter, for over 50 individuals( called 50+)     . omeprazole (PRILOSEC OTC) 20 MG tablet Take 20 mg by mouth daily.    Carly Walker DELICA LANCETS 48O MISC 1 Bottle by Does not apply route 2 (two) times daily. 60 each 0  . Vitamin D, Ergocalciferol, (DRISDOL) 50000 units CAPS capsule Take 1 capsule (50,000 Units total) by mouth every 7 (seven) days. 4 capsule 0   No current facility-administered medications on file prior to visit.     PAST MEDICAL HISTORY: Past Medical History:  Diagnosis Date  . Allergic rhinitis, cause unspecified   . Allergy    SEASONAL  . Arthritis    LOWER BACK  . Back pain   . Cancer [facility])    ENDOMETRIAL CANCER  . Cancer ([place])   . Chest pain   . Colon polyps 01/23/2006   type unknown   . Constipation   . Depression   . Essential hypertension, benign   .  Fatty liver   . GERD (gastroesophageal reflux disease)   . History of endometrial cancer 07/2009  . HTN (hypertension)   . Hypercholesteremia   . IBS (irritable bowel syndrome)   . Joint pain   . Leg edema   . Microscopic hematuria   . Migraine   . Mixed hyperlipidemia   . Ophthalmic herpes simplex   . Palpitations   . Proteinuria   . Tremor, essential   . Vitamin D deficiency     PAST SURGICAL HISTORY: Past Surgical History:  Procedure Laterality Date  . ABDOMINAL HYSTERECTOMY    . APPENDECTOMY    . CESAREAN SECTION    . CYST REMOVED FROM R WRIST    . HERNIA REPAIR  03/2010  . REPEAT CESAREAN SECTION    . TUBAL LIGATION    . WRIST FRACTURE SURGERY Left 07/31/2015    SOCIAL HISTORY: Social History   Tobacco Use  . Smoking status: Never Smoker  .  Smokeless tobacco: Never Used  Substance Use Topics  . Alcohol use: No    Alcohol/week: 0.0 oz  . Drug use: No    FAMILY HISTORY: Family History  Problem Relation Age of Onset  . Breast cancer Mother   . Emphysema Father   . Diabetes Father   . Heart disease Father   . Alcoholism Father   . Heart disease Paternal Grandmother   . Diabetes Brother   . Heart disease Brother     ROS: Review of Systems  Constitutional: Positive for weight loss.  Endo/Heme/Allergies:       Negative hypoglycemia    PHYSICAL EXAM: Blood pressure 113/72, pulse 68, temperature 98.2 F (36.8 C), temperature source Oral, height '5\' 1"'  (1.549 m), weight 152 lb (68.9 kg), SpO2 98 %. Body mass index is 28.72 kg/m. Physical Exam  Constitutional: She is oriented to person, place, and time. She appears well-developed and well-nourished.  Cardiovascular: Normal rate.  Pulmonary/Chest: Effort normal.  Musculoskeletal: Normal range of motion.  Neurological: She is oriented to person, place, and time.  Skin: Skin is warm and dry.  Psychiatric: She has a normal mood and affect. Her behavior is normal.  Vitals reviewed.   RECENT LABS AND TESTS: BMET    Component Value Date/Time   NA 145 (H) 03/17/2017 1220   K 3.9 03/17/2017 1220   CL 100 03/17/2017 1220   CO2 26 03/17/2017 1220   GLUCOSE 116 (H) 03/17/2017 1220   GLUCOSE 102 (H) 12/08/2015 1037   BUN 14 03/17/2017 1220   CREATININE 0.83 03/17/2017 1220   CREATININE 0.82 12/08/2015 1037   CALCIUM 9.7 03/17/2017 1220   GFRNONAA 74 03/17/2017 1220   GFRNONAA 76 12/08/2015 1037   GFRAA 86 03/17/2017 1220   GFRAA 88 12/08/2015 1037   Lab Results  Component Value Date   HGBA1C 7.1 (H) 03/17/2017   HGBA1C 6.3 (H) 12/08/2015   HGBA1C 5.9 10/01/2013   HGBA1C 6.0 10/27/2012   Lab Results  Component Value Date   INSULIN 30.9 (H) 03/17/2017   CBC    Component Value Date/Time   WBC 6.2 03/17/2017 1220   WBC 7.1 12/08/2015 1058   WBC 5.1  09/15/2014 0942   RBC 5.11 03/17/2017 1220   RBC 5.11 12/08/2015 1058   RBC 4.90 09/15/2014 0942   HGB 14.7 03/17/2017 1220   HCT 44.0 03/17/2017 1220   PLT 297 10/22/2016 1457   MCV 86 03/17/2017 1220   MCH 28.8 03/17/2017 1220   MCH 31.1 12/08/2015 1058  MCH 30.4 09/15/2014 0942   MCHC 33.4 03/17/2017 1220   MCHC 35.9 (A) 12/08/2015 1058   MCHC 34.5 09/15/2014 0942   RDW 14.6 03/17/2017 1220   LYMPHSABS 2.6 03/17/2017 1220   MONOABS 0.4 09/15/2014 0942   EOSABS 0.2 03/17/2017 1220   BASOSABS 0.0 03/17/2017 1220   Iron/TIBC/Ferritin/ %Sat    Component Value Date/Time   IRON 97 12/18/2015 1218   TIBC 394 12/18/2015 1218   FERRITIN 31 12/18/2015 1218   IRONPCTSAT 25 12/18/2015 1218   Lipid Panel     Component Value Date/Time   CHOL 181 03/17/2017 1220   TRIG 166 (H) 03/17/2017 1220   HDL 44 03/17/2017 1220   CHOLHDL 3.5 10/22/2016 1457   CHOLHDL 3.6 12/08/2015 1037   VLDL 33 (H) 12/08/2015 1037   LDLCALC 104 (H) 03/17/2017 1220   Hepatic Function Panel     Component Value Date/Time   PROT 7.1 03/17/2017 1220   ALBUMIN 4.5 03/17/2017 1220   AST 55 (H) 03/17/2017 1220   ALT 64 (H) 03/17/2017 1220   ALKPHOS 94 03/17/2017 1220   BILITOT 0.4 03/17/2017 1220      Component Value Date/Time   TSH 1.800 03/17/2017 1220   TSH 2.220 10/22/2016 1457   TSH 1.75 12/08/2015 1037    ASSESSMENT AND PLAN: Type 2 diabetes mellitus without complication, without long-term current use of insulin ([place]) - Plan: metFORMIN (GLUCOPHAGE) 500 MG tablet  Class 1 obesity with serious comorbidity and body mass index (BMI) of 30.0 to 30.9 in adult, unspecified obesity type - Starting BMI greater then 30  PLAN:  Diabetes II Shaleka has been given extensive diabetes education by myself today including ideal fasting and post-prandial blood glucose readings, individual ideal Hgb A1c goals and hypoglycemia prevention. We discussed the importance of good blood sugar control to decrease the  likelihood of diabetic complications such as nephropathy, neuropathy, limb loss, blindness, coronary artery disease, and death. We discussed the importance of intensive lifestyle modification including diet, exercise and weight loss as the first line treatment for diabetes. Mekayla agrees to continue metformin 500 mg qd #30 with no refills and follow up at the agreed upon time.  Obesity Nelly is currently in the action stage of change. As such, her goal is to continue with weight loss efforts She has agreed to keep a food journal with 1100 to 1300 calories and 75+ grams of protein daily Tiera has been instructed to work up to a goal of 150 minutes of combined cardio and strengthening exercise per week for weight loss and overall health benefits. We discussed the following Behavioral Modification Strategies today: no skipping meals, increase H2O intake and increasing lean protein intake  Ernisha has agreed to follow up with our clinic in 2 to 3 weeks. She was informed of the importance of frequent follow up visits to maximize her success with intensive lifestyle modifications for her multiple health conditions.    OBESITY BEHAVIORAL INTERVENTION VISIT  Today's visit was # 8 out of 22.  Starting weight: 173 lbs Starting date: 03/17/17 Today's weight : 152 lbs Today's date: 07/10/2017 Total lbs lost to date: 21 (Patients must lose 7 lbs in the first 6 months to continue with counseling)   ASK: We discussed the diagnosis of obesity with [name] today and Danissa agreed to give [place] permission to discuss obesity behavioral modification therapy today.  ASSESS: Rodneshia has the diagnosis of obesity and her BMI today is 28.74 Keziah is in the action stage  of change   ADVISE: Belicia was educated on the multiple health risks of obesity as well as the benefit of weight loss to improve her health. She was advised of the need for long term treatment and the importance of lifestyle  modifications.  AGREE: Multiple dietary modification options and treatment options were discussed and  Justyn agreed to keep a food journal with 1100 to 1300 calories and 75+ grams of protein daily We discussed the following Behavioral Modification Strategies today: no skipping meals, increase H2O intake and increasing lean protein intake  I, Doreene Nest, am acting as Location manager for Dennard Nip, MD  I have reviewed the above documentation for accuracy and completeness, and I agree with the above. -Dennard Nip, MD

## 2017-07-13 DIAGNOSIS — J069 Acute upper respiratory infection, unspecified: Secondary | ICD-10-CM | POA: Diagnosis not present

## 2017-07-15 DIAGNOSIS — R202 Paresthesia of skin: Secondary | ICD-10-CM | POA: Diagnosis not present

## 2017-07-15 DIAGNOSIS — L821 Other seborrheic keratosis: Secondary | ICD-10-CM | POA: Diagnosis not present

## 2017-07-15 DIAGNOSIS — L570 Actinic keratosis: Secondary | ICD-10-CM | POA: Diagnosis not present

## 2017-07-15 DIAGNOSIS — Z85828 Personal history of other malignant neoplasm of skin: Secondary | ICD-10-CM | POA: Diagnosis not present

## 2017-07-15 DIAGNOSIS — D225 Melanocytic nevi of trunk: Secondary | ICD-10-CM | POA: Diagnosis not present

## 2017-07-15 DIAGNOSIS — D1801 Hemangioma of skin and subcutaneous tissue: Secondary | ICD-10-CM | POA: Diagnosis not present

## 2017-07-15 MED ORDER — METFORMIN HCL 500 MG PO TABS: 500.0000 mg | ORAL_TABLET | Freq: Every day | ORAL | 0 refills | Status: DC

## 2017-07-18 ENCOUNTER — Other Ambulatory Visit (INDEPENDENT_AMBULATORY_CARE_PROVIDER_SITE_OTHER): Payer: Self-pay | Admitting: Family Medicine

## 2017-07-18 DIAGNOSIS — E559 Vitamin D deficiency, unspecified: Secondary | ICD-10-CM

## 2017-07-24 ENCOUNTER — Ambulatory Visit (INDEPENDENT_AMBULATORY_CARE_PROVIDER_SITE_OTHER): Payer: Medicare Other | Admitting: Family Medicine

## 2017-07-24 VITALS — BP 122/78 | HR 74 | Temp 97.7°F | Ht 61.0 in | Wt 156.0 lb

## 2017-07-24 DIAGNOSIS — E119 Type 2 diabetes mellitus without complications: Secondary | ICD-10-CM

## 2017-07-24 DIAGNOSIS — E669 Obesity, unspecified: Secondary | ICD-10-CM

## 2017-07-24 DIAGNOSIS — E559 Vitamin D deficiency, unspecified: Secondary | ICD-10-CM

## 2017-07-24 DIAGNOSIS — Z6830 Body mass index (BMI) 30.0-30.9, adult: Secondary | ICD-10-CM

## 2017-07-24 MED ORDER — VITAMIN D (ERGOCALCIFEROL) 1.25 MG (50000 UNIT) PO CAPS: 50000.0000 [IU] | ORAL_CAPSULE | ORAL | 0 refills | Status: DC

## 2017-07-24 MED ORDER — GLUCOSE BLOOD VI STRP: ORAL_STRIP | 0 refills | Status: DC

## 2017-07-24 NOTE — Progress Notes (Signed)
Office: [phone]  /  Fax: [phone]   HPI:   Chief Complaint: OBESITY Carly Walker is here to discuss her progress with her obesity treatment plan. She is on the keep a food journal with 1100-1300 calories and 75+ grams of protein daily and is following her eating plan approximately 40 % of the time. She states she is walking for 30 minutes 3 times per week. Carly Walker is not feeling very well this morning. She has been feeling a little more clear this morning. Otherwise, not finding it difficult to get protein in. She is keeping a consistent written food journal.  Her weight is 156 lb (70.8 kg) today and has gained 4 pounds since her last visit. She has lost 17 lbs since starting treatment with [place].  Diabetes II Carly Walker has a diagnosis of diabetes type II. Carly Walker states fasting BGs range between 88 and low 100's and post prandial range in 120's. She denies any hypoglycemic episodes, dizziness, or sweats. She is on metformin with no GI upset. Last A1c was 7.1 on 03/17/17. She has been working on intensive lifestyle modifications including diet, exercise, and weight loss to help control her blood glucose levels.  Vitamin D deficiency Carly Walker has a diagnosis of vitamin D deficiency. She is currently taking prescription Vit D and denies nausea, vomiting or muscle weakness.  ALLERGIES: Allergies  Allergen Reactions  . Ace Inhibitors Cough  . Codeine Nausea Only  . Hydrocodone   . Sulfa Antibiotics Hives    MEDICATIONS: Current Outpatient Medications on File Prior to Visit  Medication Sig Dispense Refill  . acyclovir (ZOVIRAX) 400 MG tablet Take 800 mg by mouth daily. Ophthalmic HSV 1     . amLODipine (NORVASC) 10 MG tablet Take 1 tablet daily 90 tablet 3  . aspirin 81 MG tablet Take 81 mg by mouth daily.    Marland Kitchen atorvastatin (LIPITOR) 20 MG tablet Take 1 tablet (20 mg total) by mouth daily. 90 tablet 3  . Blood Glucose Monitoring Suppl (ONETOUCH VERIO) w/Device KIT 1 kit by Does not apply  route 2 (two) times daily. 1 kit 0  . cetirizine (ZYRTEC) 10 MG tablet Take 10 mg by mouth daily.    . hydrochlorothiazide (MICROZIDE) 12.5 MG capsule Take 1 capsule (12.5 mg total) by mouth daily. 90 capsule 3  . ketotifen (ZADITOR) 0.025 % ophthalmic solution 1 drop 2 (two) times daily.     . metFORMIN (GLUCOPHAGE) 500 MG tablet Take 1 tablet (500 mg total) by mouth daily with breakfast. 30 tablet 0  . Multiple Vitamin (MULTIVITAMIN) tablet Take 1 tablet by mouth daily. Over the counter, for over 50 individuals( called 50+)     . omeprazole (PRILOSEC OTC) 20 MG tablet Take 20 mg by mouth daily.    Carly Walker DELICA LANCETS 62I MISC 1 Bottle by Does not apply route 2 (two) times daily. 60 each 0   No current facility-administered medications on file prior to visit.     PAST MEDICAL HISTORY: Past Medical History:  Diagnosis Date  . Allergic rhinitis, cause unspecified   . Allergy    SEASONAL  . Arthritis    LOWER BACK  . Back pain   . Cancer [facility])    ENDOMETRIAL CANCER  . Cancer ([place])   . Chest pain   . Colon polyps 01/23/2006   type unknown   . Constipation   . Depression   . Essential hypertension, benign   . Fatty liver   . GERD (gastroesophageal reflux disease)   .  History of endometrial cancer 07/2009  . HTN (hypertension)   . Hypercholesteremia   . IBS (irritable bowel syndrome)   . Joint pain   . Leg edema   . Microscopic hematuria   . Migraine   . Mixed hyperlipidemia   . Ophthalmic herpes simplex   . Palpitations   . Proteinuria   . Tremor, essential   . Vitamin D deficiency     PAST SURGICAL HISTORY: Past Surgical History:  Procedure Laterality Date  . ABDOMINAL HYSTERECTOMY    . APPENDECTOMY    . CESAREAN SECTION    . CYST REMOVED FROM R WRIST    . HERNIA REPAIR  03/2010  . REPEAT CESAREAN SECTION    . TUBAL LIGATION    . WRIST FRACTURE SURGERY Left 07/31/2015    SOCIAL HISTORY: Social History   Tobacco Use  . Smoking status: Never Smoker  .  Smokeless tobacco: Never Used  Substance Use Topics  . Alcohol use: No    Alcohol/week: 0.0 oz  . Drug use: No    FAMILY HISTORY: Family History  Problem Relation Age of Onset  . Breast cancer Mother   . Emphysema Father   . Diabetes Father   . Heart disease Father   . Alcoholism Father   . Heart disease Paternal Grandmother   . Diabetes Brother   . Heart disease Brother     ROS: Review of Systems  Constitutional: Negative for weight loss.       Negative sweats  Gastrointestinal: Negative for nausea and vomiting.  Musculoskeletal:       Negative muscle weakness  Neurological: Negative for dizziness.  Endo/Heme/Allergies:       Negative hypoglycemia    PHYSICAL EXAM: Blood pressure 122/78, pulse 74, temperature 97.7 F (36.5 C), temperature source Oral, height _0  (1.549 m), weight 156 lb (70.8 kg), SpO2 98 %. Body mass index is 29.48 kg/m. Physical Exam  Constitutional: She is oriented to person, place, and time. She appears well-developed and well-nourished.  Cardiovascular: Normal rate.  Pulmonary/Chest: Effort normal.  Musculoskeletal: Normal range of motion.  Neurological: She is oriented to person, place, and time.  Skin: Skin is warm and dry.  Psychiatric: She has a normal mood and affect. Her behavior is normal.  Vitals reviewed.   RECENT LABS AND TESTS: BMET    Component Value Date/Time   NA 145 (H) 03/17/2017 1220   K 3.9 03/17/2017 1220   CL 100 03/17/2017 1220   CO2 26 03/17/2017 1220   GLUCOSE 116 (H) 03/17/2017 1220   GLUCOSE 102 (H) 12/08/2015 1037   BUN 14 03/17/2017 1220   CREATININE 0.83 03/17/2017 1220   CREATININE 0.82 12/08/2015 1037   CALCIUM 9.7 03/17/2017 1220   GFRNONAA 74 03/17/2017 1220   GFRNONAA 76 12/08/2015 1037   GFRAA 86 03/17/2017 1220   GFRAA 88 12/08/2015 1037   Lab Results  Component Value Date   HGBA1C 7.1 (H) 03/17/2017   HGBA1C 6.3 (H) 12/08/2015   HGBA1C 5.9 10/01/2013   HGBA1C 6.0 10/27/2012   Lab  Results  Component Value Date   INSULIN 30.9 (H) 03/17/2017   CBC    Component Value Date/Time   WBC 6.2 03/17/2017 1220   WBC 7.1 12/08/2015 1058   WBC 5.1 09/15/2014 0942   RBC 5.11 03/17/2017 1220   RBC 5.11 12/08/2015 1058   RBC 4.90 09/15/2014 0942   HGB 14.7 03/17/2017 1220   HCT 44.0 03/17/2017 1220   PLT 297 10/22/2016 1457  MCV 86 03/17/2017 1220   MCH 28.8 03/17/2017 1220   MCH 31.1 12/08/2015 1058   MCH 30.4 09/15/2014 0942   MCHC 33.4 03/17/2017 1220   MCHC 35.9 (A) 12/08/2015 1058   MCHC 34.5 09/15/2014 0942   RDW 14.6 03/17/2017 1220   LYMPHSABS 2.6 03/17/2017 1220   MONOABS 0.4 09/15/2014 0942   EOSABS 0.2 03/17/2017 1220   BASOSABS 0.0 03/17/2017 1220   Iron/TIBC/Ferritin/ %Sat    Component Value Date/Time   IRON 97 12/18/2015 1218   TIBC 394 12/18/2015 1218   FERRITIN 31 12/18/2015 1218   IRONPCTSAT 25 12/18/2015 1218   Lipid Panel     Component Value Date/Time   CHOL 181 03/17/2017 1220   TRIG 166 (H) 03/17/2017 1220   HDL 44 03/17/2017 1220   CHOLHDL 3.5 10/22/2016 1457   CHOLHDL 3.6 12/08/2015 1037   VLDL 33 (H) 12/08/2015 1037   LDLCALC 104 (H) 03/17/2017 1220   Hepatic Function Panel     Component Value Date/Time   PROT 7.1 03/17/2017 1220   ALBUMIN 4.5 03/17/2017 1220   AST 55 (H) 03/17/2017 1220   ALT 64 (H) 03/17/2017 1220   ALKPHOS 94 03/17/2017 1220   BILITOT 0.4 03/17/2017 1220      Component Value Date/Time   TSH 1.800 03/17/2017 1220   TSH 2.220 10/22/2016 1457   TSH 1.75 12/08/2015 1037  Results for LUS, KRIEGEL (MRN [id number]) as of 07/24/2017 12:45  Ref. Range 03/17/2017 12:20  Vitamin D, 25-Hydroxy Latest Ref Range: 30.0 - 100.0 ng/mL 36.8    ASSESSMENT AND PLAN: Type 2 diabetes mellitus without complication, without long-term current use of insulin ([facility]) - Plan: glucose blood (ONETOUCH VERIO) test strip  Vitamin D deficiency - Plan: Vitamin D, Ergocalciferol, (DRISDOL) 50000 units CAPS capsule  Class 1  obesity with serious comorbidity and body mass index (BMI) of 30.0 to 30.9 in adult, unspecified obesity type - Starting BMI greater then 30  PLAN:  Diabetes II Carly Walker has been given extensive diabetes education by myself today including ideal fasting and post-prandial blood glucose readings, individual ideal Hgb A1c goals and hypoglycemia prevention. We discussed the importance of good blood sugar control to decrease the likelihood of diabetic complications such as nephropathy, neuropathy, limb loss, blindness, coronary artery disease, and death. We discussed the importance of intensive lifestyle modification including diet, exercise and weight loss as the first line treatment for diabetes. Carly Walker agrees to continue her diabetes medications as prescribed and we will refill glucose test strips for 1 month. Carly Walker agrees to follow up with our clinic in 2 weeks.  Vitamin D Deficiency Carly Walker was informed that low vitamin D levels contributes to fatigue and are associated with obesity, breast, and colon cancer. Carly Walker agrees to continue taking prescription Vit D _0 ,000 IU every week #4 and we will refill for 1 month. She will follow up for routine testing of vitamin D, at least 2-3 times per year. She was informed of the risk of over-replacement of vitamin D and agrees to not increase her dose unless she discusses this with [place] first. Carly Walker agrees to follow up with our clinic in 2 weeks.  Obesity Carly Walker is currently in the action stage of change. As such, her goal is to continue with weight loss efforts She has agreed to keep a food journal with 1100-1300 calories and 75+ grams of protein daily Carly Walker has been instructed to work up to a goal of 150 minutes of combined cardio and strengthening exercise per  week for weight loss and overall health benefits. We discussed the following Behavioral Modification Strategies today: increasing lean protein intake, decreasing simple carbohydrates, dealing  with family or coworker sabotage, increase H20 intake, and keep a strict food journal   Carly Walker has agreed to follow up with our clinic in 2 weeks. She was informed of the importance of frequent follow up visits to maximize her success with intensive lifestyle modifications for her multiple health conditions.   OBESITY BEHAVIORAL INTERVENTION VISIT  Today's visit was # 9 out of 22.  Starting weight: 173 lbs Starting date: 03/17/17 Today's weight : 156 lbs  Today's date: 07/24/2017 Total lbs lost to date: 17 (Patients must lose 7 lbs in the first 6 months to continue with counseling)   ASK: We discussed the diagnosis of obesity with Carly Walker today and Carly Walker agreed to give [place] permission to discuss obesity behavioral modification therapy today.  ASSESS: Carly Walker has the diagnosis of obesity and her BMI today is 29.49 Carly Walker is in the action stage of change   ADVISE: Carly Walker was educated on the multiple health risks of obesity as well as the benefit of weight loss to improve her health. She was advised of the need for long term treatment and the importance of lifestyle modifications.  AGREE: Multiple dietary modification options and treatment options were discussed and  Carly Walker agreed to the above obesity treatment plan.  I, Trixie Dredge, am acting as transcriptionist for Dennard Nip, MD  I have reviewed the above documentation for accuracy and completeness, and I agree with the above. -Dennard Nip, MD

## 2017-08-05 ENCOUNTER — Ambulatory Visit (INDEPENDENT_AMBULATORY_CARE_PROVIDER_SITE_OTHER): Payer: Medicare Other | Admitting: Family Medicine

## 2017-08-05 VITALS — BP 128/77 | HR 68 | Temp 97.8°F | Ht 61.0 in | Wt 152.0 lb

## 2017-08-05 DIAGNOSIS — Z6830 Body mass index (BMI) 30.0-30.9, adult: Secondary | ICD-10-CM | POA: Diagnosis not present

## 2017-08-05 DIAGNOSIS — E119 Type 2 diabetes mellitus without complications: Secondary | ICD-10-CM

## 2017-08-05 DIAGNOSIS — E559 Vitamin D deficiency, unspecified: Secondary | ICD-10-CM | POA: Diagnosis not present

## 2017-08-05 DIAGNOSIS — E669 Obesity, unspecified: Secondary | ICD-10-CM

## 2017-08-05 MED ORDER — VITAMIN D (ERGOCALCIFEROL) 1.25 MG (50000 UNIT) PO CAPS
50000.0000 [IU] | ORAL_CAPSULE | ORAL | 0 refills | Status: DC
Start: 2017-08-05 — End: 2017-08-21

## 2017-08-05 MED ORDER — METFORMIN HCL 500 MG PO TABS: 500.0000 mg | ORAL_TABLET | Freq: Every day | ORAL | 0 refills | Status: DC

## 2017-08-05 NOTE — Progress Notes (Signed)
Office: [phone]  /  Fax: [phone]   HPI:   Chief Complaint: OBESITY Carly Walker is here to discuss her progress with her obesity treatment plan. She is on the  keep a food journal with 1100 to 1300 calories and 75+ grams of protein daily and is following her eating plan approximately 85 % of the time. She states she is exercising 30 to 60 minutes 5 times per week. Carly Walker continues to do well with weight loss. She is journaling better and is meeting her calorie and protein goals. Her weight is 152 lb (68.9 kg) today and has had a weight loss of 4 pounds over a period of 2 weeks since her last visit. She has lost 21 lbs since starting treatment with [place].  Diabetes II Carly Walker has a diagnosis of diabetes type II. Carly Walker states fasting BGs range between 85 and 110, 2 hour post prandial range between 95 and 151 and she denies any hypoglycemic episodes. She is doing well on diabetic diet and with weight loss. She has been working on intensive lifestyle modifications including diet, exercise, and weight loss to help control her blood glucose levels.  Vitamin D deficiency Carly Walker has a diagnosis of vitamin D deficiency. She is  Stable on vit D and denies nausea, vomiting or muscle weakness.   Ref. Range 03/17/2017 12:20  Vitamin D, 25-Hydroxy Latest Ref Range: 30.0 - 100.0 ng/mL 36.8    ALLERGIES: Allergies  Allergen Reactions  . Ace Inhibitors Cough  . Codeine Nausea Only  . Hydrocodone   . Sulfa Antibiotics Hives    MEDICATIONS: Current Outpatient Medications on File Prior to Visit  Medication Sig Dispense Refill  . acyclovir (ZOVIRAX) 400 MG tablet Take 800 mg by mouth daily. Ophthalmic HSV 1     . amLODipine (NORVASC) 10 MG tablet Take 1 tablet daily 90 tablet 3  . aspirin 81 MG tablet Take 81 mg by mouth daily.    Marland Kitchen atorvastatin (LIPITOR) 20 MG tablet Take 1 tablet (20 mg total) by mouth daily. 90 tablet 3  . Blood Glucose Monitoring Suppl (ONETOUCH VERIO) w/Device KIT 1 kit  by Does not apply route 2 (two) times daily. 1 kit 0  . cetirizine (ZYRTEC) 10 MG tablet Take 10 mg by mouth daily.    Marland Kitchen glucose blood (ONETOUCH VERIO) test strip Test blood sugars twice daily 100 each 0  . hydrochlorothiazide (MICROZIDE) 12.5 MG capsule Take 1 capsule (12.5 mg total) by mouth daily. 90 capsule 3  . ketotifen (ZADITOR) 0.025 % ophthalmic solution 1 drop 2 (two) times daily.     . metFORMIN (GLUCOPHAGE) 500 MG tablet Take 1 tablet (500 mg total) by mouth daily with breakfast. 30 tablet 0  . Multiple Vitamin (MULTIVITAMIN) tablet Take 1 tablet by mouth daily. Over the counter, for over 50 individuals( called 50+)     . omeprazole (PRILOSEC OTC) 20 MG tablet Take 20 mg by mouth daily.    Carly Walker DELICA LANCETS 73A MISC 1 Bottle by Does not apply route 2 (two) times daily. 60 each 0  . Vitamin D, Ergocalciferol, (DRISDOL) 50000 units CAPS capsule Take 1 capsule (50,000 Units total) by mouth every 7 (seven) days. 4 capsule 0   No current facility-administered medications on file prior to visit.     PAST MEDICAL HISTORY: Past Medical History:  Diagnosis Date  . Allergic rhinitis, cause unspecified   . Allergy    SEASONAL  . Arthritis    LOWER BACK  . Back pain   .  Cancer [facility])    ENDOMETRIAL CANCER  . Cancer ([place])   . Chest pain   . Colon polyps 01/23/2006   type unknown   . Constipation   . Depression   . Essential hypertension, benign   . Fatty liver   . GERD (gastroesophageal reflux disease)   . History of endometrial cancer 07/2009  . HTN (hypertension)   . Hypercholesteremia   . IBS (irritable bowel syndrome)   . Joint pain   . Leg edema   . Microscopic hematuria   . Migraine   . Mixed hyperlipidemia   . Ophthalmic herpes simplex   . Palpitations   . Proteinuria   . Tremor, essential   . Vitamin D deficiency     PAST SURGICAL HISTORY: Past Surgical History:  Procedure Laterality Date  . ABDOMINAL HYSTERECTOMY    . APPENDECTOMY    . CESAREAN  SECTION    . CYST REMOVED FROM R WRIST    . HERNIA REPAIR  03/2010  . REPEAT CESAREAN SECTION    . TUBAL LIGATION    . WRIST FRACTURE SURGERY Left 07/31/2015    SOCIAL HISTORY: Social History   Tobacco Use  . Smoking status: Never Smoker  . Smokeless tobacco: Never Used  Substance Use Topics  . Alcohol use: No    Alcohol/week: 0.0 oz  . Drug use: No    FAMILY HISTORY: Family History  Problem Relation Age of Onset  . Breast cancer Mother   . Emphysema Father   . Diabetes Father   . Heart disease Father   . Alcoholism Father   . Heart disease Paternal Grandmother   . Diabetes Brother   . Heart disease Brother     ROS: Review of Systems  Constitutional: Positive for weight loss.  Gastrointestinal: Negative for nausea and vomiting.  Musculoskeletal:       Negative muscle weakness  Endo/Heme/Allergies:       Negative hypoglycemia    PHYSICAL EXAM: Blood pressure 128/77, pulse 68, temperature 97.8 F (36.6 C), temperature source Oral, height '5\' 1"'  (1.549 m), weight 152 lb (68.9 kg), SpO2 96 %. Body mass index is 28.72 kg/m. Physical Exam  Constitutional: She is oriented to person, place, and time. She appears well-developed and well-nourished.  Cardiovascular: Normal rate.  Pulmonary/Chest: Effort normal.  Musculoskeletal: Normal range of motion.  Neurological: She is oriented to person, place, and time.  Skin: Skin is warm and dry.  Psychiatric: She has a normal mood and affect. Her behavior is normal.  Vitals reviewed.   RECENT LABS AND TESTS: BMET    Component Value Date/Time   NA 145 (H) 03/17/2017 1220   K 3.9 03/17/2017 1220   CL 100 03/17/2017 1220   CO2 26 03/17/2017 1220   GLUCOSE 116 (H) 03/17/2017 1220   GLUCOSE 102 (H) 12/08/2015 1037   BUN 14 03/17/2017 1220   CREATININE 0.83 03/17/2017 1220   CREATININE 0.82 12/08/2015 1037   CALCIUM 9.7 03/17/2017 1220   GFRNONAA 74 03/17/2017 1220   GFRNONAA 76 12/08/2015 1037   GFRAA 86 03/17/2017  1220   GFRAA 88 12/08/2015 1037   Lab Results  Component Value Date   HGBA1C 7.1 (H) 03/17/2017   HGBA1C 6.3 (H) 12/08/2015   HGBA1C 5.9 10/01/2013   HGBA1C 6.0 10/27/2012   Lab Results  Component Value Date   INSULIN 30.9 (H) 03/17/2017   CBC    Component Value Date/Time   WBC 6.2 03/17/2017 1220   WBC 7.1 12/08/2015 1058  WBC 5.1 09/15/2014 0942   RBC 5.11 03/17/2017 1220   RBC 5.11 12/08/2015 1058   RBC 4.90 09/15/2014 0942   HGB 14.7 03/17/2017 1220   HCT 44.0 03/17/2017 1220   PLT 297 10/22/2016 1457   MCV 86 03/17/2017 1220   MCH 28.8 03/17/2017 1220   MCH 31.1 12/08/2015 1058   MCH 30.4 09/15/2014 0942   MCHC 33.4 03/17/2017 1220   MCHC 35.9 (A) 12/08/2015 1058   MCHC 34.5 09/15/2014 0942   RDW 14.6 03/17/2017 1220   LYMPHSABS 2.6 03/17/2017 1220   MONOABS 0.4 09/15/2014 0942   EOSABS 0.2 03/17/2017 1220   BASOSABS 0.0 03/17/2017 1220   Iron/TIBC/Ferritin/ %Sat    Component Value Date/Time   IRON 97 12/18/2015 1218   TIBC 394 12/18/2015 1218   FERRITIN 31 12/18/2015 1218   IRONPCTSAT 25 12/18/2015 1218   Lipid Panel     Component Value Date/Time   CHOL 181 03/17/2017 1220   TRIG 166 (H) 03/17/2017 1220   HDL 44 03/17/2017 1220   CHOLHDL 3.5 10/22/2016 1457   CHOLHDL 3.6 12/08/2015 1037   VLDL 33 (H) 12/08/2015 1037   LDLCALC 104 (H) 03/17/2017 1220   Hepatic Function Panel     Component Value Date/Time   PROT 7.1 03/17/2017 1220   ALBUMIN 4.5 03/17/2017 1220   AST 55 (H) 03/17/2017 1220   ALT 64 (H) 03/17/2017 1220   ALKPHOS 94 03/17/2017 1220   BILITOT 0.4 03/17/2017 1220      Component Value Date/Time   TSH 1.800 03/17/2017 1220   TSH 2.220 10/22/2016 1457   TSH 1.75 12/08/2015 1037     Ref. Range 03/17/2017 12:20  Vitamin D, 25-Hydroxy Latest Ref Range: 30.0 - 100.0 ng/mL 36.8   ASSESSMENT AND PLAN: Type 2 diabetes mellitus without complication, without long-term current use of insulin (HCC) - Plan: metFORMIN (GLUCOPHAGE) 500 MG  tablet  Vitamin D deficiency - Plan: Vitamin D, Ergocalciferol, (DRISDOL) 50000 units CAPS capsule  Class 1 obesity with serious comorbidity and body mass index (BMI) of 30.0 to 30.9 in adult, unspecified obesity type - Starting BMI greater then 30  PLAN:  Diabetes II Carly Walker has been given extensive diabetes education by myself today including ideal fasting and post-prandial blood glucose readings, individual ideal Hgb A1c goals and hypoglycemia prevention. We discussed the importance of good blood sugar control to decrease the likelihood of diabetic complications such as nephropathy, neuropathy, limb loss, blindness, coronary artery disease, and death. We discussed the importance of intensive lifestyle modification including diet, exercise and weight loss as the first line treatment for diabetes. Arleny agrees to continue metformin 500 mg qd #30 with no refills and follow up at the agreed upon time. We will check labs at next visit.  Vitamin D Deficiency Carly Walker was informed that low vitamin D levels contributes to fatigue and are associated with obesity, breast, and colon cancer. She agrees to continue to take prescription Vit D '@50' ,000 IU every week #4 with no refills. We will check labs at next visit and will follow up for routine testing of vitamin D, at least 2-3 times per year. She was informed of the risk of over-replacement of vitamin D and agrees to not increase her dose unless she discusses this with [place] first. Carly Walker agrees to follow up with our clinic in 1 week.  Obesity Carly Walker is currently in the action stage of change. As such, her goal is to continue with weight loss efforts She has agreed to keep  a food journal with 1100 to 1300 calories and 90+ grams of protein daily Carly Walker has been instructed to work up to a goal of 150 minutes of combined cardio and strengthening exercise per week for weight loss and overall health benefits. We discussed the following Behavioral  Modification Strategies today: increasing lean protein intake, increase H2O intake, better snacking choices and keep a strict food journal.  Carly Walker has agreed to follow up with our clinic in 1 week. She was informed of the importance of frequent follow up visits to maximize her success with intensive lifestyle modifications for her multiple health conditions.   OBESITY BEHAVIORAL INTERVENTION VISIT  Today's visit was # 10 out of 22.  Starting weight: 173 lbs Starting date: 03/17/17 Today's weight : 152 lbs Today's date: 08/05/2017 Total lbs lost to date: 21 (Patients must lose 7 lbs in the first 6 months to continue with counseling)   ASK: We discussed the diagnosis of obesity with [name] today and Jasdeep agreed to give [place] permission to discuss obesity behavioral modification therapy today.  ASSESS: Victorine has the diagnosis of obesity and her BMI today is 28.74 Jozie is in the action stage of change   ADVISE: Prisca was educated on the multiple health risks of obesity as well as the benefit of weight loss to improve her health. She was advised of the need for long term treatment and the importance of lifestyle modifications.  AGREE: Multiple dietary modification options and treatment options were discussed and  Eliyanna agreed to the above obesity treatment plan.  I, Doreene Nest, am acting as transcriptionist for Dennard Nip, MD  I have reviewed the above documentation for accuracy and completeness, and I agree with the above. -Dennard Nip, MD

## 2017-08-21 ENCOUNTER — Ambulatory Visit (INDEPENDENT_AMBULATORY_CARE_PROVIDER_SITE_OTHER): Payer: Medicare Other | Admitting: Family Medicine

## 2017-08-21 VITALS — BP 127/73 | HR 62 | Temp 97.6°F | Ht 61.0 in | Wt 153.0 lb

## 2017-08-21 DIAGNOSIS — E7849 Other hyperlipidemia: Secondary | ICD-10-CM

## 2017-08-21 DIAGNOSIS — E669 Obesity, unspecified: Secondary | ICD-10-CM

## 2017-08-21 DIAGNOSIS — E119 Type 2 diabetes mellitus without complications: Secondary | ICD-10-CM | POA: Diagnosis not present

## 2017-08-21 DIAGNOSIS — Z6830 Body mass index (BMI) 30.0-30.9, adult: Secondary | ICD-10-CM

## 2017-08-21 DIAGNOSIS — E559 Vitamin D deficiency, unspecified: Secondary | ICD-10-CM | POA: Diagnosis not present

## 2017-08-21 MED ORDER — VITAMIN D (ERGOCALCIFEROL) 1.25 MG (50000 UNIT) PO CAPS: 50000.0000 [IU] | ORAL_CAPSULE | ORAL | 0 refills | Status: DC

## 2017-08-21 NOTE — Progress Notes (Signed)
Office: [phone]  /  Fax: [phone]   HPI:   Chief Complaint: OBESITY Carly Walker is here to discuss her progress with her obesity treatment plan. She is on the keep a food journal with 1100 to 1300 calories and 90+ grams of protein daily and is following her eating plan approximately 89 % of the time. She states she is exercising 30 to 60 minutes 4 times per week. Carly Walker is journaling most days. She has had some days with increased calories and is working on increasing her lean protein intake. Carly Walker is definitely more mindful about her food choices. Her weight is 153 lb (69.4 kg) today and has had a weight gain of 1 pound over a period of 2 weeks since her last visit. She has lost 20 lbs since starting treatment with [place].  Vitamin D deficiency Carly Walker has a diagnosis of vitamin D deficiency and is due for labs. She is currently taking vit D and is not yet at goal. Carly Walker denies nausea, vomiting or muscle weakness.   Ref. Range 03/17/2017 12:20  Vitamin D, 25-Hydroxy Latest Ref Range: 30.0 - 100.0 ng/mL 36.8   Diabetes II Carly Walker has a diagnosis of diabetes type II and is due for labs. Carly Walker states fasting BGs range between 100 and 1150 now on metformin. She denies nausea, vomiting or any hypoglycemic episodes. Last A1c was at 7.1 She has been working on intensive lifestyle modifications including diet, exercise, and weight loss to help control her blood glucose levels.  Mixed Hyperlipidemia Carly Walker has hyperlipidemia and is on lipitor. Her last triglycerides and LDL were elevated. She has been trying to improve her cholesterol levels with intensive lifestyle modification including a low saturated fat diet, exercise and weight loss. She denies any chest pain, claudication or myalgias. Carly Walker is due for labs.  ALLERGIES: Allergies  Allergen Reactions  . Ace Inhibitors Cough  . Codeine Nausea Only  . Hydrocodone   . Sulfa Antibiotics Hives    MEDICATIONS: Current Outpatient  Medications on File Prior to Visit  Medication Sig Dispense Refill  . acyclovir (ZOVIRAX) 400 MG tablet Take 800 mg by mouth daily. Ophthalmic HSV 1     . amLODipine (NORVASC) 10 MG tablet Take 1 tablet daily 90 tablet 3  . aspirin 81 MG tablet Take 81 mg by mouth daily.    Marland Kitchen atorvastatin (LIPITOR) 20 MG tablet Take 1 tablet (20 mg total) by mouth daily. 90 tablet 3  . Blood Glucose Monitoring Suppl (ONETOUCH VERIO) w/Device KIT 1 kit by Does not apply route 2 (two) times daily. 1 kit 0  . cetirizine (ZYRTEC) 10 MG tablet Take 10 mg by mouth daily.    Marland Kitchen glucose blood (ONETOUCH VERIO) test strip Test blood sugars twice daily 100 each 0  . hydrochlorothiazide (MICROZIDE) 12.5 MG capsule Take 1 capsule (12.5 mg total) by mouth daily. 90 capsule 3  . ketotifen (ZADITOR) 0.025 % ophthalmic solution 1 drop 2 (two) times daily.     . metFORMIN (GLUCOPHAGE) 500 MG tablet Take 1 tablet (500 mg total) by mouth daily with breakfast. 30 tablet 0  . Multiple Vitamin (MULTIVITAMIN) tablet Take 1 tablet by mouth daily. Over the counter, for over 50 individuals( called 50+)     . omeprazole (PRILOSEC OTC) 20 MG tablet Take 20 mg by mouth daily.    Carly Walker DELICA LANCETS 58P MISC 1 Bottle by Does not apply route 2 (two) times daily. 60 each 0   No current facility-administered medications on file  prior to visit.     PAST MEDICAL HISTORY: Past Medical History:  Diagnosis Date  . Allergic rhinitis, cause unspecified   . Allergy    SEASONAL  . Arthritis    LOWER BACK  . Back pain   . Cancer Carly Walker)    ENDOMETRIAL CANCER  . Cancer ([place])   . Chest pain   . Colon polyps 01/23/2006   type unknown   . Constipation   . Depression   . Essential hypertension, benign   . Fatty liver   . GERD (gastroesophageal reflux disease)   . History of endometrial cancer 07/2009  . HTN (hypertension)   . Hypercholesteremia   . IBS (irritable bowel syndrome)   . Joint pain   . Leg edema   . Microscopic hematuria     . Migraine   . Mixed hyperlipidemia   . Ophthalmic herpes simplex   . Palpitations   . Proteinuria   . Tremor, essential   . Vitamin D deficiency     PAST SURGICAL HISTORY: Past Surgical History:  Procedure Laterality Date  . ABDOMINAL HYSTERECTOMY    . APPENDECTOMY    . CESAREAN SECTION    . CYST REMOVED FROM R WRIST    . HERNIA REPAIR  03/2010  . REPEAT CESAREAN SECTION    . TUBAL LIGATION    . WRIST FRACTURE SURGERY Left 07/31/2015    SOCIAL HISTORY: Social History   Tobacco Use  . Smoking status: Never Smoker  . Smokeless tobacco: Never Used  Substance Use Topics  . Alcohol use: No    Alcohol/week: 0.0 oz  . Drug use: No    FAMILY HISTORY: Family History  Problem Relation Age of Onset  . Breast cancer Mother   . Emphysema Father   . Diabetes Father   . Heart disease Father   . Alcoholism Father   . Heart disease Paternal Grandmother   . Diabetes Brother   . Heart disease Brother     ROS: Review of Systems  Constitutional: Negative for weight loss.  Cardiovascular: Negative for chest pain and claudication.  Gastrointestinal: Negative for nausea and vomiting.  Musculoskeletal: Negative for myalgias.       Negative for muscle weakness  Endo/Heme/Allergies:       Negative for hypoglycemia    PHYSICAL EXAM: Blood pressure 127/73, pulse 62, temperature 97.6 F (36.4 C), temperature source Oral, height '5\' 1"'  (1.549 m), weight 153 lb (69.4 kg), SpO2 97 %. Body mass index is 28.91 kg/m. Physical Exam  Constitutional: She is oriented to person, place, and time. She appears well-developed and well-nourished.  Cardiovascular: Normal rate.  Pulmonary/Chest: Effort normal.  Musculoskeletal: Normal range of motion.  Neurological: She is oriented to person, place, and time.  Skin: Skin is warm and dry.  Psychiatric: She has a normal mood and affect. Her behavior is normal.  Vitals reviewed.   RECENT LABS AND TESTS: BMET    Component Value Date/Time    NA 145 (H) 03/17/2017 1220   K 3.9 03/17/2017 1220   CL 100 03/17/2017 1220   CO2 26 03/17/2017 1220   GLUCOSE 116 (H) 03/17/2017 1220   GLUCOSE 102 (H) 12/08/2015 1037   BUN 14 03/17/2017 1220   CREATININE 0.83 03/17/2017 1220   CREATININE 0.82 12/08/2015 1037   CALCIUM 9.7 03/17/2017 1220   GFRNONAA 74 03/17/2017 1220   GFRNONAA 76 12/08/2015 1037   GFRAA 86 03/17/2017 1220   GFRAA 88 12/08/2015 1037   Lab Results  Component  Value Date   HGBA1C 7.1 (H) 03/17/2017   HGBA1C 6.3 (H) 12/08/2015   HGBA1C 5.9 10/01/2013   HGBA1C 6.0 10/27/2012   Lab Results  Component Value Date   INSULIN 30.9 (H) 03/17/2017   CBC    Component Value Date/Time   WBC 6.2 03/17/2017 1220   WBC 7.1 12/08/2015 1058   WBC 5.1 09/15/2014 0942   RBC 5.11 03/17/2017 1220   RBC 5.11 12/08/2015 1058   RBC 4.90 09/15/2014 0942   HGB 14.7 03/17/2017 1220   HCT 44.0 03/17/2017 1220   PLT 297 10/22/2016 1457   MCV 86 03/17/2017 1220   MCH 28.8 03/17/2017 1220   MCH 31.1 12/08/2015 1058   MCH 30.4 09/15/2014 0942   MCHC 33.4 03/17/2017 1220   MCHC 35.9 (A) 12/08/2015 1058   MCHC 34.5 09/15/2014 0942   RDW 14.6 03/17/2017 1220   LYMPHSABS 2.6 03/17/2017 1220   MONOABS 0.4 09/15/2014 0942   EOSABS 0.2 03/17/2017 1220   BASOSABS 0.0 03/17/2017 1220   Iron/TIBC/Ferritin/ %Sat    Component Value Date/Time   IRON 97 12/18/2015 1218   TIBC 394 12/18/2015 1218   FERRITIN 31 12/18/2015 1218   IRONPCTSAT 25 12/18/2015 1218   Lipid Panel     Component Value Date/Time   CHOL 181 03/17/2017 1220   TRIG 166 (H) 03/17/2017 1220   HDL 44 03/17/2017 1220   CHOLHDL 3.5 10/22/2016 1457   CHOLHDL 3.6 12/08/2015 1037   VLDL 33 (H) 12/08/2015 1037   LDLCALC 104 (H) 03/17/2017 1220   Hepatic Function Panel     Component Value Date/Time   PROT 7.1 03/17/2017 1220   ALBUMIN 4.5 03/17/2017 1220   AST 55 (H) 03/17/2017 1220   ALT 64 (H) 03/17/2017 1220   ALKPHOS 94 03/17/2017 1220   BILITOT 0.4  03/17/2017 1220      Component Value Date/Time   TSH 1.800 03/17/2017 1220   TSH 2.220 10/22/2016 1457   TSH 1.75 12/08/2015 1037     Ref. Range 03/17/2017 12:20  Vitamin D, 25-Hydroxy Latest Ref Range: 30.0 - 100.0 ng/mL 36.8   ASSESSMENT AND PLAN: Type 2 diabetes mellitus without complication, without long-term current use of insulin (HCC) - Plan: Comprehensive metabolic panel, Hemoglobin A1c, Insulin, random  Vitamin D deficiency - Plan: VITAMIN D 25 Hydroxy (Vit-D Deficiency, Fractures), Vitamin D, Ergocalciferol, (DRISDOL) 50000 units CAPS capsule  Other hyperlipidemia - Plan: Lipid Panel With LDL/HDL Ratio  Class 1 obesity with serious comorbidity and body mass index (BMI) of 30.0 to 30.9 in adult, unspecified obesity type - BMI greater than 30 at start of program   PLAN:  Vitamin D Deficiency Saprina was informed that low vitamin D levels contributes to fatigue and are associated with obesity, breast, and colon cancer. She agrees to continue to take prescription Vit D '@50' ,000 IU every week #4 with no refills. We will check labs and will follow up for routine testing of vitamin D, at least 2-3 times per year. She was informed of the risk of over-replacement of vitamin D and agrees to not increase her dose unless she discusses this with [place] first. Zeriah agrees to follow up with our clinic in 2 weeks.  Diabetes II Kaysen has been given extensive diabetes education by myself today including ideal fasting and post-prandial blood glucose readings, individual ideal Hgb A1c goals  and hypoglycemia prevention. We discussed the importance of good blood sugar control to decrease the likelihood of diabetic complications such as nephropathy, neuropathy, limb  loss, blindness, coronary artery disease, and death. We discussed the importance of intensive lifestyle modification including diet, exercise and weight loss as the first line treatment for diabetes. We will check labs and she agrees to  continue with diet and exercise. Kedra agrees to continue metformin 500 mg qd and will follow up at the agreed upon time.  Hyperlipidemia Sheriann was informed of the American Heart Association Guidelines emphasizing intensive lifestyle modifications as the first line treatment for hyperlipidemia. We discussed many lifestyle modifications today in depth, and Benna will continue to work on decreasing saturated fats such as fatty red meat, butter and many fried foods. She will also increase vegetables and lean protein in her diet and continue to work on exercise and weight loss efforts. We will check labs and Roger and follow up as directed.  Obesity Karmin is currently in the action stage of change. As such, her goal is to continue with weight loss efforts She has agreed to keep a food journal with 1100 to 1300 calories and 80+ grams of protein daily Yianna has been instructed to work up to a goal of 150 minutes of combined cardio and strengthening exercise per week for weight loss and overall health benefits. We discussed the following Behavioral Modification Strategies today: keep a strict food journal, increasing lean protein intake, decreasing simple carbohydrates  and work on meal planning and easy cooking plans  Tanaia has agreed to follow up with our clinic in 2 weeks. She was informed of the importance of frequent follow up visits to maximize her success with intensive lifestyle modifications for her multiple health conditions.   OBESITY BEHAVIORAL INTERVENTION VISIT  Today's visit was # 11 out of 22.  Starting weight: 173 lbs Starting date: 03/17/18 Today's weight : 153 lbs Today's date: 08/21/2017 Total lbs lost to date: 20 (Patients must lose 7 lbs in the first 6 months to continue with counseling)   ASK: We discussed the diagnosis of obesity with [name] today and Rosselyn agreed to give [place] permission to discuss obesity behavioral modification therapy  today.  ASSESS: Sheneika has the diagnosis of obesity and her BMI today is 28.92 Tiena is in the action stage of change   ADVISE: Christyl was educated on the multiple health risks of obesity as well as the benefit of weight loss to improve her health. She was advised of the need for long term treatment and the importance of lifestyle modifications.  AGREE: Multiple dietary modification options and treatment options were discussed and  Kathyleen agreed to the above obesity treatment plan.  I, Doreene Nest, am acting as transcriptionist for Dennard Nip, MD  I have reviewed the above documentation for accuracy and completeness, and I agree with the above. -Dennard Nip, MD

## 2017-08-22 LAB — COMPREHENSIVE METABOLIC PANEL
ALBUMIN: 4.5 g/dL (ref 3.6–4.8)
ALK PHOS: 89 IU/L (ref 39–117)
ALT: 33 IU/L — ABNORMAL HIGH (ref 0–32)
AST: 28 IU/L (ref 0–40)
Albumin/Globulin Ratio: 1.7 (ref 1.2–2.2)
BILIRUBIN TOTAL: 0.5 mg/dL (ref 0.0–1.2)
BUN/Creatinine Ratio: 22 (ref 12–28)
BUN: 18 mg/dL (ref 8–27)
CO2: 23 mmol/L (ref 20–29)
CREATININE: 0.83 mg/dL (ref 0.57–1.00)
Calcium: 9.8 mg/dL (ref 8.7–10.3)
Chloride: 101 mmol/L (ref 96–106)
GFR calc non Af Amer: 74 mL/min/{1.73_m2} (ref >59)
GFR, EST AFRICAN AMERICAN: 86 mL/min/{1.73_m2} (ref >59)
GLOBULIN, TOTAL: 2.6 g/dL (ref 1.5–4.5)
Glucose: 96 mg/dL (ref 65–99)
Potassium: 3.9 mmol/L (ref 3.5–5.2)
SODIUM: 142 mmol/L (ref 134–144)
TOTAL PROTEIN: 7.1 g/dL (ref 6.0–8.5)

## 2017-08-22 LAB — LIPID PANEL WITH LDL/HDL RATIO
CHOLESTEROL TOTAL: 213 mg/dL — AB (ref 100–199)
HDL: 54 mg/dL (ref >39)
LDL Calculated: 130 mg/dL — ABNORMAL HIGH (ref 0–99)
LDL/HDL RATIO: 2.4 ratio (ref 0.0–3.2)
TRIGLYCERIDES: 144 mg/dL (ref 0–149)
VLDL CHOLESTEROL CAL: 29 mg/dL (ref 5–40)

## 2017-08-22 LAB — HEMOGLOBIN A1C
Est. average glucose Bld gHb Est-mCnc: 120 mg/dL
HEMOGLOBIN A1C: 5.8 % — AB (ref 4.8–5.6)

## 2017-08-22 LAB — INSULIN, RANDOM: INSULIN: 11.7 u[IU]/mL (ref 2.6–24.9)

## 2017-08-22 LAB — VITAMIN D 25 HYDROXY (VIT D DEFICIENCY, FRACTURES): Vit D, 25-Hydroxy: 69.4 ng/mL (ref 30.0–100.0)

## 2017-09-04 ENCOUNTER — Ambulatory Visit (INDEPENDENT_AMBULATORY_CARE_PROVIDER_SITE_OTHER): Payer: Medicare Other | Admitting: Family Medicine

## 2017-09-04 ENCOUNTER — Encounter (INDEPENDENT_AMBULATORY_CARE_PROVIDER_SITE_OTHER): Payer: Self-pay | Admitting: Family Medicine

## 2017-09-04 VITALS — BP 122/76 | HR 71 | Temp 98.1°F | Ht 61.0 in | Wt 156.0 lb

## 2017-09-04 DIAGNOSIS — Z9189 Other specified personal risk factors, not elsewhere classified: Secondary | ICD-10-CM | POA: Diagnosis not present

## 2017-09-04 DIAGNOSIS — E119 Type 2 diabetes mellitus without complications: Secondary | ICD-10-CM | POA: Diagnosis not present

## 2017-09-04 DIAGNOSIS — E669 Obesity, unspecified: Secondary | ICD-10-CM

## 2017-09-04 DIAGNOSIS — E559 Vitamin D deficiency, unspecified: Secondary | ICD-10-CM

## 2017-09-04 DIAGNOSIS — Z6830 Body mass index (BMI) 30.0-30.9, adult: Secondary | ICD-10-CM

## 2017-09-04 MED ORDER — METFORMIN HCL 500 MG PO TABS: 500.0000 mg | ORAL_TABLET | Freq: Every day | ORAL | 0 refills | Status: DC

## 2017-09-04 NOTE — Progress Notes (Signed)
Office: [phone]  /  Fax: [phone]   HPI:   Chief Complaint: OBESITY Carly Walker is here to discuss her progress with her obesity treatment plan. She is on the keep a food journal with 1100-1300 calories and 80+ grams of protein daily and is following her eating plan approximately 89 % of the time. She states she is walking for 150-200 minutes 4-6 times per week. Carmita is retaining some fluid, has increased simple carbohydrates and sodium. She is journaling on and off. Her dog had to be euthanized and she has increase emotional eating.  Her weight is 156 lb (70.8 kg) today and has gained 3 pounds since her last visit. She has lost 17 lbs since starting treatment with [place].  Diabetes II Carly Walker has a diagnosis of diabetes type II. Carly Walker A1c greatly improved with diet and low dose metformin. She did not bring BGs log today. She denies hypoglycemia. Last A1c was 5.8 on 08/21/17. She has been working on intensive lifestyle modifications including diet, exercise, and weight loss to help control her blood glucose levels.  Vitamin D Deficiency Carly Walker has a diagnosis of vitamin D deficiency. She is on prescription Vit D, level now at goal. She notes fatigue improved and denies nausea, vomiting or muscle weakness.  At risk for osteopenia and osteoporosis Carly Walker is at higher risk of osteopenia and osteoporosis due to vitamin D deficiency.   ALLERGIES: Allergies  Allergen Reactions  . Ace Inhibitors Cough  . Codeine Nausea Only  . Hydrocodone   . Sulfa Antibiotics Hives    MEDICATIONS: Current Outpatient Medications on File Prior to Visit  Medication Sig Dispense Refill  . acyclovir (ZOVIRAX) 400 MG tablet Take 800 mg by mouth daily. Ophthalmic HSV 1     . amLODipine (NORVASC) 10 MG tablet Take 1 tablet daily 90 tablet 3  . aspirin 81 MG tablet Take 81 mg by mouth daily.    Marland Kitchen atorvastatin (LIPITOR) 20 MG tablet Take 1 tablet (20 mg total) by mouth daily. 90 tablet 3  . Blood  Glucose Monitoring Suppl (ONETOUCH VERIO) w/Device KIT 1 kit by Does not apply route 2 (two) times daily. 1 kit 0  . cetirizine (ZYRTEC) 10 MG tablet Take 10 mg by mouth daily.    Marland Kitchen glucose blood (ONETOUCH VERIO) test strip Test blood sugars twice daily 100 each 0  . hydrochlorothiazide (MICROZIDE) 12.5 MG capsule Take 1 capsule (12.5 mg total) by mouth daily. 90 capsule 3  . ketotifen (ZADITOR) 0.025 % ophthalmic solution 1 drop 2 (two) times daily.     . metFORMIN (GLUCOPHAGE) 500 MG tablet Take 1 tablet (500 mg total) by mouth daily with breakfast. 30 tablet 0  . Multiple Vitamin (MULTIVITAMIN) tablet Take 1 tablet by mouth daily. Over the counter, for over 50 individuals( called 50+)     . omeprazole (PRILOSEC OTC) 20 MG tablet Take 20 mg by mouth daily.    Carly Walker DELICA LANCETS 83G MISC 1 Bottle by Does not apply route 2 (two) times daily. 60 each 0  . Vitamin D, Ergocalciferol, (DRISDOL) 50000 units CAPS capsule Take 1 capsule (50,000 Units total) by mouth every 7 (seven) days. 4 capsule 0   No current facility-administered medications on file prior to visit.     PAST MEDICAL HISTORY: Past Medical History:  Diagnosis Date  . Allergic rhinitis, cause unspecified   . Allergy    SEASONAL  . Arthritis    LOWER BACK  . Back pain   . Cancer (  [place])    ENDOMETRIAL CANCER  . Cancer ([place])   . Chest pain   . Colon polyps 01/23/2006   type unknown   . Constipation   . Depression   . Essential hypertension, benign   . Fatty liver   . GERD (gastroesophageal reflux disease)   . History of endometrial cancer 07/2009  . HTN (hypertension)   . Hypercholesteremia   . IBS (irritable bowel syndrome)   . Joint pain   . Leg edema   . Microscopic hematuria   . Migraine   . Mixed hyperlipidemia   . Ophthalmic herpes simplex   . Palpitations   . Proteinuria   . Tremor, essential   . Vitamin D deficiency     PAST SURGICAL HISTORY: Past Surgical History:  Procedure Laterality Date  .  ABDOMINAL HYSTERECTOMY    . APPENDECTOMY    . CESAREAN SECTION    . CYST REMOVED FROM R WRIST    . HERNIA REPAIR  03/2010  . REPEAT CESAREAN SECTION    . TUBAL LIGATION    . WRIST FRACTURE SURGERY Left 07/31/2015    SOCIAL HISTORY: Social History   Tobacco Use  . Smoking status: Never Smoker  . Smokeless tobacco: Never Used  Substance Use Topics  . Alcohol use: No    Alcohol/week: 0.0 oz  . Drug use: No    FAMILY HISTORY: Family History  Problem Relation Age of Onset  . Breast cancer Mother   . Emphysema Father   . Diabetes Father   . Heart disease Father   . Alcoholism Father   . Heart disease Paternal Grandmother   . Diabetes Brother   . Heart disease Brother     ROS: Review of Systems  Constitutional: Negative for malaise/fatigue and weight loss.  Gastrointestinal: Negative for nausea and vomiting.  Musculoskeletal:       Negative muscle weakness  Endo/Heme/Allergies:       Negative hypoglycemia    PHYSICAL EXAM: Blood pressure 122/76, pulse 71, temperature 98.1 F (36.7 C), height _0  (1.549 m), weight 156 lb (70.8 kg), SpO2 96 %. Body mass index is 29.48 kg/m. Physical Exam  Constitutional: She is oriented to person, place, and time. She appears well-developed and well-nourished.  Cardiovascular: Normal rate.  Pulmonary/Chest: Effort normal.  Musculoskeletal: Normal range of motion.  Neurological: She is oriented to person, place, and time.  Skin: Skin is warm and dry.  Psychiatric: She has a normal mood and affect. Her behavior is normal.  Vitals reviewed.   RECENT LABS AND TESTS: BMET    Component Value Date/Time   NA 142 08/21/2017 0752   K 3.9 08/21/2017 0752   CL 101 08/21/2017 0752   CO2 23 08/21/2017 0752   GLUCOSE 96 08/21/2017 0752   GLUCOSE 102 (H) 12/08/2015 1037   BUN 18 08/21/2017 0752   CREATININE 0.83 08/21/2017 0752   CREATININE 0.82 12/08/2015 1037   CALCIUM 9.8 08/21/2017 0752   GFRNONAA 74 08/21/2017 0752   GFRNONAA  76 12/08/2015 1037   GFRAA 86 08/21/2017 0752   GFRAA 88 12/08/2015 1037   Lab Results  Component Value Date   HGBA1C 5.8 (H) 08/21/2017   HGBA1C 7.1 (H) 03/17/2017   HGBA1C 6.3 (H) 12/08/2015   HGBA1C 5.9 10/01/2013   HGBA1C 6.0 10/27/2012   Lab Results  Component Value Date   INSULIN 11.7 08/21/2017   INSULIN 30.9 (H) 03/17/2017   CBC    Component Value Date/Time   WBC 6.2 03/17/2017  1220   WBC 7.1 12/08/2015 1058   WBC 5.1 09/15/2014 0942   RBC 5.11 03/17/2017 1220   RBC 5.11 12/08/2015 1058   RBC 4.90 09/15/2014 0942   HGB 14.7 03/17/2017 1220   HCT 44.0 03/17/2017 1220   PLT 297 10/22/2016 1457   MCV 86 03/17/2017 1220   MCH 28.8 03/17/2017 1220   MCH 31.1 12/08/2015 1058   MCH 30.4 09/15/2014 0942   MCHC 33.4 03/17/2017 1220   MCHC 35.9 (A) 12/08/2015 1058   MCHC 34.5 09/15/2014 0942   RDW 14.6 03/17/2017 1220   LYMPHSABS 2.6 03/17/2017 1220   MONOABS 0.4 09/15/2014 0942   EOSABS 0.2 03/17/2017 1220   BASOSABS 0.0 03/17/2017 1220   Iron/TIBC/Ferritin/ %Sat    Component Value Date/Time   IRON 97 12/18/2015 1218   TIBC 394 12/18/2015 1218   FERRITIN 31 12/18/2015 1218   IRONPCTSAT 25 12/18/2015 1218   Lipid Panel     Component Value Date/Time   CHOL 213 (H) 08/21/2017 0752   TRIG 144 08/21/2017 0752   HDL 54 08/21/2017 0752   CHOLHDL 3.5 10/22/2016 1457   CHOLHDL 3.6 12/08/2015 1037   VLDL 33 (H) 12/08/2015 1037   LDLCALC 130 (H) 08/21/2017 0752   Hepatic Function Panel     Component Value Date/Time   PROT 7.1 08/21/2017 0752   ALBUMIN 4.5 08/21/2017 0752   AST 28 08/21/2017 0752   ALT 33 (H) 08/21/2017 0752   ALKPHOS 89 08/21/2017 0752   BILITOT 0.5 08/21/2017 0752      Component Value Date/Time   TSH 1.800 03/17/2017 1220   TSH 2.220 10/22/2016 1457   TSH 1.75 12/08/2015 1037  Results for ALEYSHA, MECKLER (MRN [id number]) as of 09/04/2017 13:12  Ref. Range 08/21/2017 07:52  Vitamin D, 25-Hydroxy Latest Ref Range: 30.0 - 100.0 ng/mL 69.4      ASSESSMENT AND PLAN: Type 2 diabetes mellitus without complication, without long-term current use of insulin (HCC) - Plan: metFORMIN (GLUCOPHAGE) 500 MG tablet  Vitamin D deficiency  At risk for osteoporosis  Class 1 obesity with serious comorbidity and body mass index (BMI) of 30.0 to 30.9 in adult, unspecified obesity type - Starting BMI greater then 30  PLAN:  Diabetes II Carly Walker has been given extensive diabetes education by myself today including ideal fasting and post-prandial blood glucose readings, individual ideal Hgb A1c goals and hypoglycemia prevention. We discussed the importance of good blood sugar control to decrease the likelihood of diabetic complications such as nephropathy, neuropathy, limb loss, blindness, coronary artery disease, and death. We discussed the importance of intensive lifestyle modification including diet, exercise and weight loss as the first line treatment for diabetes. Carly Walker agrees to continue taking metformin 500 mg q AM #30 and we will refill for 1 month. Carly Walker agrees to follow up with our clinic in 3 weeks.  Vitamin D Deficiency Carly Walker was informed that low vitamin D levels contributes to fatigue and are associated with obesity, breast, and colon cancer. Carly Walker is to finish this month's prescription Vit D, then will change to OTC. She will follow up for routine testing of vitamin D, at least 2-3 times per year. She was informed of the risk of over-replacement of vitamin D and agrees to not increase her dose unless she discusses this with [place] first. Carly Walker agrees to follow up with our clinic in 3 weeks.  At risk for osteopenia and osteoporosis Carly Walker is at risk for osteopenia and osteoporsis due to her vitamin D deficiency.  She was encouraged to take her vitamin D and follow her higher calcium diet and increase strengthening exercise to help strengthen her bones and decrease her risk of osteopenia and osteoporosis.  Obesity Carly Walker is currently  in the action stage of change. As such, her goal is to continue with weight loss efforts She has Walker to keep a food journal with 1100-1300 calories and 80+ grams of protein daily Carly Walker has been instructed to work up to a goal of 150 minutes of combined cardio and strengthening exercise per week for weight loss and overall health benefits. We discussed the following Behavioral Modification Strategies today: increasing lean protein intake, decreasing simple carbohydrates , decrease eating out and work on meal planning and easy cooking plans   Carly Walker has Walker to follow up with our clinic in 3 weeks. She was informed of the importance of frequent follow up visits to maximize her success with intensive lifestyle modifications for her multiple health conditions.   OBESITY BEHAVIORAL INTERVENTION VISIT  Today's visit was # 12 out of 22.  Starting weight: 173 lbs Starting date: 03/17/17 Today's weight : 156 lbs  Today's date: 09/04/2017 Total lbs lost to date: 17 (Patients must lose 7 lbs in the first 6 months to continue with counseling)   ASK: We discussed the diagnosis of obesity with Carly Walker today and Carly Walker to give [place] permission to discuss obesity behavioral modification therapy today.  ASSESS: Carly Walker has the diagnosis of obesity and her BMI today is 29.49 Carly Walker is in the action stage of change   ADVISE: Shayona was educated on the multiple health risks of obesity as well as the benefit of weight loss to improve her health. She was advised of the need for long term treatment and the importance of lifestyle modifications.  AGREE: Multiple dietary modification options and treatment options were discussed and  Stormee Walker to the above obesity treatment plan.  I, Trixie Carly Walker, am acting as transcriptionist for Dennard Nip, MD  I have reviewed the above documentation for accuracy and completeness, and I agree with the above. -Dennard Nip, MD

## 2017-09-10 ENCOUNTER — Other Ambulatory Visit (INDEPENDENT_AMBULATORY_CARE_PROVIDER_SITE_OTHER): Payer: Self-pay | Admitting: Family Medicine

## 2017-09-10 DIAGNOSIS — E119 Type 2 diabetes mellitus without complications: Secondary | ICD-10-CM

## 2017-09-16 ENCOUNTER — Other Ambulatory Visit: Payer: Self-pay | Admitting: Physician Assistant

## 2017-09-16 DIAGNOSIS — I10 Essential (primary) hypertension: Secondary | ICD-10-CM

## 2017-09-25 ENCOUNTER — Ambulatory Visit (INDEPENDENT_AMBULATORY_CARE_PROVIDER_SITE_OTHER): Payer: Medicare Other | Admitting: Family Medicine

## 2017-09-25 VITALS — BP 124/80 | HR 63 | Temp 98.0°F | Ht 61.0 in | Wt 158.0 lb

## 2017-09-25 DIAGNOSIS — F3289 Other specified depressive episodes: Secondary | ICD-10-CM | POA: Diagnosis not present

## 2017-09-25 DIAGNOSIS — E669 Obesity, unspecified: Secondary | ICD-10-CM

## 2017-09-25 DIAGNOSIS — E559 Vitamin D deficiency, unspecified: Secondary | ICD-10-CM | POA: Diagnosis not present

## 2017-09-25 DIAGNOSIS — Z6830 Body mass index (BMI) 30.0-30.9, adult: Secondary | ICD-10-CM

## 2017-09-25 MED ORDER — VITAMIN D 1000 UNITS PO TABS: 1000.0000 [IU] | ORAL_TABLET | Freq: Every day | ORAL | Status: DC

## 2017-09-25 MED ORDER — BUPROPION HCL ER (SR) 150 MG PO TB12: 150.0000 mg | ORAL_TABLET | Freq: Every day | ORAL | 0 refills | Status: DC

## 2017-09-25 NOTE — Progress Notes (Signed)
Office: [phone]  /  Fax: [phone]   HPI:   Chief Complaint: OBESITY Carly Walker is here to discuss her progress with her obesity treatment plan. She is on the  keep a food journal with 1100-1300 calories and 80+ grams of protein daily and is following her eating plan approximately 5 % of the time. She states she is walking for 30 minutes 5 times per week. Carly Walker has gotten off track with diet , has increased emotional eating and not planning meals but she states she is now ready to try to get back on track.  Her weight is 158 lb (71.7 kg) today and has gained 2 pounds since her last visit. She has lost 15 lbs since starting treatment with [place].  Vitamin D Deficiency Carly Walker has a diagnosis of vitamin D deficiency. She on prescription Vit D, last Vit D at goal and is at risk for over-replacement. She denies nausea, vomiting or muscle weakness.  Depression with emotional eating behaviors Carly Walker notes situational depression in addition to normal grieving with having to put down her dog. She has increased using food for comfort. Carly Walker struggles with emotional eating and using food for comfort to the extent that it is negatively impacting her health. She often snacks when she is not hungry. Carly Walker sometimes feels she is out of control and then feels guilty that she made poor food choices. She has been working on behavior modification techniques to help reduce her emotional eating and has been somewhat successful. She shows no sign of suicidal or homicidal ideations.  Depression screen Carly Walker 2/9 03/17/2017 11/19/2016 11/15/2016 10/22/2016 12/18/2015  Decreased Interest 1 0 0 0 0  Down, Depressed, Hopeless 1 0 0 0 0  PHQ - 2 Score 2 0 0 0 0  Altered sleeping 1 - - - -  Tired, decreased energy 2 - - - -  Change in appetite 3 - - - -  Feeling bad or failure about yourself  1 - - - -  Trouble concentrating 1 - - - -  Moving slowly or fidgety/restless 0 - - - -  Suicidal thoughts 0 - - - -    PHQ-9 Score 10 - - - -  Difficult doing work/chores Somewhat difficult - - - -   ALLERGIES: Allergies  Allergen Reactions  . Ace Inhibitors Cough  . Codeine Nausea Only  . Hydrocodone   . Sulfa Antibiotics Hives    MEDICATIONS: Current Outpatient Medications on File Prior to Visit  Medication Sig Dispense Refill  . acyclovir (ZOVIRAX) 400 MG tablet Take 800 mg by mouth daily. Ophthalmic HSV 1     . amLODipine (NORVASC) 10 MG tablet Take 1 tablet daily 90 tablet 3  . aspirin 81 MG tablet Take 81 mg by mouth daily.    Marland Kitchen atorvastatin (LIPITOR) 20 MG tablet Take 1 tablet (20 mg total) by mouth daily. 90 tablet 3  . Blood Glucose Monitoring Suppl (ONETOUCH VERIO) w/Device KIT 1 kit by Does not apply route 2 (two) times daily. 1 kit 0  . cetirizine (ZYRTEC) 10 MG tablet Take 10 mg by mouth daily.    . hydrochlorothiazide (MICROZIDE) 12.5 MG capsule TAKE 1 CAPSULE BY MOUTH EVERY DAY 90 capsule 3  . ketotifen (ZADITOR) 0.025 % ophthalmic solution 1 drop 2 (two) times daily.     . metFORMIN (GLUCOPHAGE) 500 MG tablet Take 1 tablet (500 mg total) by mouth daily with breakfast. 30 tablet 0  . Multiple Vitamin (MULTIVITAMIN) tablet Take 1 tablet  by mouth daily. Over the counter, for over 50 individuals( called 50+)     . omeprazole (PRILOSEC OTC) 20 MG tablet Take 20 mg by mouth daily.    Carly Walker DELICA LANCETS 50D MISC 1 Bottle by Does not apply route 2 (two) times daily. 60 each 0  . ONETOUCH VERIO test strip TEST BLOOD SUGARS TWICE DAILY 100 each 0   No current facility-administered medications on file prior to visit.     PAST MEDICAL HISTORY: Past Medical History:  Diagnosis Date  . Allergic rhinitis, cause unspecified   . Allergy    SEASONAL  . Arthritis    LOWER BACK  . Back pain   . [facility])    ENDOMETRIAL CANCER  . Cancer ([place])   . Chest pain   . Colon polyps 01/23/2006   type unknown   . Constipation   . Depression   . Essential hypertension, benign   . Fatty  liver   . GERD (gastroesophageal reflux disease)   . History of endometrial cancer 07/2009  . HTN (hypertension)   . Hypercholesteremia   . IBS (irritable bowel syndrome)   . Joint pain   . Leg edema   . Microscopic hematuria   . Migraine   . Mixed hyperlipidemia   . Ophthalmic herpes simplex   . Palpitations   . Proteinuria   . Tremor, essential   . Vitamin D deficiency     PAST SURGICAL HISTORY: Past Surgical History:  Procedure Laterality Date  . ABDOMINAL HYSTERECTOMY    . APPENDECTOMY    . CESAREAN SECTION    . CYST REMOVED FROM R WRIST    . HERNIA REPAIR  03/2010  . REPEAT CESAREAN SECTION    . TUBAL LIGATION    . WRIST FRACTURE SURGERY Left 07/31/2015    SOCIAL HISTORY: Social History   Tobacco Use  . Smoking status: Never Smoker  . Smokeless tobacco: Never Used  Substance Use Topics  . Alcohol use: No    Alcohol/week: 0.0 oz  . Drug use: No    FAMILY HISTORY: Family History  Problem Relation Age of Onset  . Breast cancer Mother   . Emphysema Father   . Diabetes Father   . Heart disease Father   . Alcoholism Father   . Heart disease Paternal Grandmother   . Diabetes Brother   . Heart disease Brother     ROS: Review of Systems  Constitutional: Negative for weight loss.  Gastrointestinal: Negative for nausea and vomiting.  Musculoskeletal:       Negative muscle weakness  Psychiatric/Behavioral: Positive for depression. Negative for suicidal ideas.    PHYSICAL EXAM: Blood pressure 124/80, pulse 63, temperature 98 F (36.7 C), temperature source Oral, height _0  (1.549 m), weight 158 lb (71.7 kg), SpO2 96 %. Body mass index is 29.85 kg/m. Physical Exam  Constitutional: She is oriented to person, place, and time. She appears well-developed and well-nourished.  Cardiovascular: Normal rate.  Pulmonary/Chest: Effort normal.  Musculoskeletal: Normal range of motion.  Neurological: She is oriented to person, place, and time.  Skin: Skin is  warm and dry.  Psychiatric: She has a normal mood and affect. Her behavior is normal.  Vitals reviewed.   RECENT LABS AND TESTS: BMET    Component Value Date/Time   NA 142 08/21/2017 0752   K 3.9 08/21/2017 0752   CL 101 08/21/2017 0752   CO2 23 08/21/2017 0752   GLUCOSE 96 08/21/2017 0752   GLUCOSE 102 (  H) 12/08/2015 1037   BUN 18 08/21/2017 0752   CREATININE 0.83 08/21/2017 0752   CREATININE 0.82 12/08/2015 1037   CALCIUM 9.8 08/21/2017 0752   GFRNONAA 74 08/21/2017 0752   GFRNONAA 76 12/08/2015 1037   GFRAA 86 08/21/2017 0752   GFRAA 88 12/08/2015 1037   Lab Results  Component Value Date   HGBA1C 5.8 (H) 08/21/2017   HGBA1C 7.1 (H) 03/17/2017   HGBA1C 6.3 (H) 12/08/2015   HGBA1C 5.9 10/01/2013   HGBA1C 6.0 10/27/2012   Lab Results  Component Value Date   INSULIN 11.7 08/21/2017   INSULIN 30.9 (H) 03/17/2017   CBC    Component Value Date/Time   WBC 6.2 03/17/2017 1220   WBC 7.1 12/08/2015 1058   WBC 5.1 09/15/2014 0942   RBC 5.11 03/17/2017 1220   RBC 5.11 12/08/2015 1058   RBC 4.90 09/15/2014 0942   HGB 14.7 03/17/2017 1220   HCT 44.0 03/17/2017 1220   PLT 297 10/22/2016 1457   MCV 86 03/17/2017 1220   MCH 28.8 03/17/2017 1220   MCH 31.1 12/08/2015 1058   MCH 30.4 09/15/2014 0942   MCHC 33.4 03/17/2017 1220   MCHC 35.9 (A) 12/08/2015 1058   MCHC 34.5 09/15/2014 0942   RDW 14.6 03/17/2017 1220   LYMPHSABS 2.6 03/17/2017 1220   MONOABS 0.4 09/15/2014 0942   EOSABS 0.2 03/17/2017 1220   BASOSABS 0.0 03/17/2017 1220   Iron/TIBC/Ferritin/ %Sat    Component Value Date/Time   IRON 97 12/18/2015 1218   TIBC 394 12/18/2015 1218   FERRITIN 31 12/18/2015 1218   IRONPCTSAT 25 12/18/2015 1218   Lipid Panel     Component Value Date/Time   CHOL 213 (H) 08/21/2017 0752   TRIG 144 08/21/2017 0752   HDL 54 08/21/2017 0752   CHOLHDL 3.5 10/22/2016 1457   CHOLHDL 3.6 12/08/2015 1037   VLDL 33 (H) 12/08/2015 1037   LDLCALC 130 (H) 08/21/2017 0752    Hepatic Function Panel     Component Value Date/Time   PROT 7.1 08/21/2017 0752   ALBUMIN 4.5 08/21/2017 0752   AST 28 08/21/2017 0752   ALT 33 (H) 08/21/2017 0752   ALKPHOS 89 08/21/2017 0752   BILITOT 0.5 08/21/2017 0752      Component Value Date/Time   TSH 1.800 03/17/2017 1220   TSH 2.220 10/22/2016 1457   TSH 1.75 12/08/2015 1037  Results for Carly Walker, Carly Walker (MRN [id number]) as of 09/25/2017 14:59  Ref. Range 08/21/2017 07:52  Vitamin D, 25-Hydroxy Latest Ref Range: 30.0 - 100.0 ng/mL 69.4    ASSESSMENT AND PLAN: Vitamin D deficiency - Plan: cholecalciferol (VITAMIN D) 1000 units tablet  Other depression - with emotional eating - Plan: buPROPion (WELLBUTRIN SR) 150 MG 12 hr tablet  Class 1 obesity with serious comorbidity and body mass index (BMI) of 30.0 to 30.9 in adult, unspecified obesity type - Starting BMI greater then 30  PLAN:  Vitamin D Deficiency Carly Walker was informed that low vitamin D levels contributes to fatigue and are associated with obesity, breast, and colon cancer. Carly Walker agrees to discontinue prescription Vit D and start OTC Vit D 1,000 IU daily. She will follow up for routine testing of vitamin D, at least 2-3 times per year. She was informed of the risk of over-replacement of vitamin D and agrees to not increase her dose unless she discusses this with [place] first. Carly Walker agrees to follow up with our clinic in 2 weeks.  Depression with Emotional Eating Behaviors We discussed behavior  modification techniques today to help Carly Walker deal with her emotional eating and depression. Carly Walker agrees to start Wellbutrin SR 150 mg q AM #30 with no refills. Carly Walker agrees to follow up with our clinic in 2 weeks.  Obesity Carly Walker is currently in the action stage of change. As such, her goal is to continue with weight loss efforts She has agreed to change to follow the Category 2 plan Carly Walker has been instructed to work up to a goal of 150 minutes of combined cardio  and strengthening exercise per week for weight loss and overall health benefits. We discussed the following Behavioral Modification Strategies today: increasing lean protein intake, increasing vegetables, work on meal planning and easy cooking plans and emotional eating strategies   Carly Walker has agreed to follow up with our clinic in 2 weeks. She was informed of the importance of frequent follow up visits to maximize her success with intensive lifestyle modifications for her multiple health conditions.   OBESITY BEHAVIORAL INTERVENTION VISIT  Today's visit was # 13 out of 22.  Starting weight: 173 lbs Starting date: 03/17/17 Today's weight : 158 lbs  Today's date: 09/25/2017 Total lbs lost to date: 15 (Patients must lose 7 lbs in the first 6 months to continue with counseling)   ASK: We discussed the diagnosis of obesity with [name] today and Helayne agreed to give [place] permission to discuss obesity behavioral modification therapy today.  ASSESS: Carly Walker has the diagnosis of obesity and her BMI today is 29.87 Floreen is in the action stage of change   ADVISE: Brielyn was educated on the multiple health risks of obesity as well as the benefit of weight loss to improve her health. She was advised of the need for long term treatment and the importance of lifestyle modifications.  AGREE: Multiple dietary modification options and treatment options were discussed and  Robertta agreed to the above obesity treatment plan.  I, Trixie Dredge, am acting as transcriptionist for Dennard Nip, MD  I have reviewed the above documentation for accuracy and completeness, and I agree with the above. -Dennard Nip, MD

## 2017-10-08 ENCOUNTER — Other Ambulatory Visit (INDEPENDENT_AMBULATORY_CARE_PROVIDER_SITE_OTHER): Payer: Self-pay | Admitting: Family Medicine

## 2017-10-08 DIAGNOSIS — E119 Type 2 diabetes mellitus without complications: Secondary | ICD-10-CM

## 2017-10-13 ENCOUNTER — Encounter: Payer: Self-pay | Admitting: Physician Assistant

## 2017-10-16 ENCOUNTER — Ambulatory Visit (INDEPENDENT_AMBULATORY_CARE_PROVIDER_SITE_OTHER): Payer: Medicare Other | Admitting: Family Medicine

## 2017-10-16 VITALS — BP 124/74 | HR 57 | Temp 97.7°F | Ht 61.0 in | Wt 150.0 lb

## 2017-10-16 DIAGNOSIS — Z6830 Body mass index (BMI) 30.0-30.9, adult: Secondary | ICD-10-CM | POA: Diagnosis not present

## 2017-10-16 DIAGNOSIS — F3289 Other specified depressive episodes: Secondary | ICD-10-CM | POA: Diagnosis not present

## 2017-10-16 DIAGNOSIS — E669 Obesity, unspecified: Secondary | ICD-10-CM | POA: Diagnosis not present

## 2017-10-16 DIAGNOSIS — E119 Type 2 diabetes mellitus without complications: Secondary | ICD-10-CM | POA: Diagnosis not present

## 2017-10-16 MED ORDER — GLUCOSE BLOOD VI STRP: ORAL_STRIP | 0 refills | Status: DC

## 2017-10-16 MED ORDER — BUPROPION HCL ER (SR) 150 MG PO TB12: 150.0000 mg | ORAL_TABLET | Freq: Every day | ORAL | 0 refills | Status: DC

## 2017-10-16 MED ORDER — METFORMIN HCL 500 MG PO TABS: 500.0000 mg | ORAL_TABLET | Freq: Every day | ORAL | 0 refills | Status: DC

## 2017-10-16 NOTE — Progress Notes (Signed)
Office: [phone]  /  Fax: [phone]   HPI:   Chief Complaint: OBESITY Carly Walker is here to discuss her progress with her obesity treatment plan. She is on the Category 2 plan and is following her eating plan approximately 50 % of the time. She states she is walking for 60-90 minutes 3 times per week. Carly Walker continues to do well with weight loss. She is going in between Category 2 plan and journaling. She is working on increasing vegetable and protein. She is doing well with meal planning. Did have GI upset some of the time but feeling better. Her weight is 150 lb (68 kg) today and has had a weight loss of 8 pounds over a period of 3 weeks since her last visit. She has lost 23 lbs since starting treatment with [place].  Diabetes II Carly Walker has a diagnosis of diabetes type II. Carly Walker states fasting BGs range between 85 and 122, 2 hour post prandial range between 88 and 158. She denies hypoglycemia. Last A1c was 5.8 on 08/21/17. She has been working on intensive lifestyle modifications including diet, exercise, and weight loss to help control her blood glucose levels.  Depression with emotional eating behaviors Carly Walker's mood is stable, doing well on Wellbutrin. She denies insomnia, blood pressure stable. Carly Walker struggles with emotional eating and using food for comfort to the extent that it is negatively impacting her health. She often snacks when she is not hungry. Carly Walker sometimes feels she is out of control and then feels guilty that she made poor food choices. She has been working on behavior modification techniques to help reduce her emotional eating and has been somewhat successful. She shows no sign of suicidal or homicidal ideations.  Depression screen [facility] 2/9 03/17/2017 11/19/2016 11/15/2016 10/22/2016 12/18/2015  Decreased Interest 1 0 0 0 0  Down, Depressed, Hopeless 1 0 0 0 0  PHQ - 2 Score 2 0 0 0 0  Altered sleeping 1 - - - -  Tired, decreased energy 2 - - - -  Change in appetite  3 - - - -  Feeling bad or failure about yourself  1 - - - -  Trouble concentrating 1 - - - -  Moving slowly or fidgety/restless 0 - - - -  Suicidal thoughts 0 - - - -  PHQ-9 Score 10 - - - -  Difficult doing work/chores Somewhat difficult - - - -   ALLERGIES: Allergies  Allergen Reactions  . Ace Inhibitors Cough  . Codeine Nausea Only  . Hydrocodone   . Sulfa Antibiotics Hives    MEDICATIONS: Current Outpatient Medications on File Prior to Visit  Medication Sig Dispense Refill  . acyclovir (ZOVIRAX) 400 MG tablet Take 400 mg by mouth daily. Ophthalmic HSV 1     . amLODipine (NORVASC) 10 MG tablet Take 1 tablet daily 90 tablet 3  . aspirin 81 MG tablet Take 81 mg by mouth daily.    Marland Kitchen atorvastatin (LIPITOR) 20 MG tablet Take 1 tablet (20 mg total) by mouth daily. 90 tablet 3  . Blood Glucose Monitoring Suppl (ONETOUCH VERIO) w/Device KIT 1 kit by Does not apply route 2 (two) times daily. 1 kit 0  . cetirizine (ZYRTEC) 10 MG tablet Take 10 mg by mouth daily.    . cholecalciferol (VITAMIN D) 1000 units tablet Take 1 tablet (1,000 Units total) by mouth daily.    . hydrochlorothiazide (MICROZIDE) 12.5 MG capsule TAKE 1 CAPSULE BY MOUTH EVERY DAY 90 capsule 3  .  ketotifen (ZADITOR) 0.025 % ophthalmic solution 1 drop 2 (two) times daily.     . Multiple Vitamin (MULTIVITAMIN) tablet Take 1 tablet by mouth daily. Over the counter, for over 50 individuals( called 50+)     . omeprazole (PRILOSEC OTC) 20 MG tablet Take 20 mg by mouth daily.    Carly Walker DELICA LANCETS 60V MISC 1 Bottle by Does not apply route 2 (two) times daily. 60 each 0   No current facility-administered medications on file prior to visit.     PAST MEDICAL HISTORY: Past Medical History:  Diagnosis Date  . Allergic rhinitis, cause unspecified   . Allergy    SEASONAL  . Arthritis    LOWER BACK  . Back pain   . Cancer [facility])    ENDOMETRIAL CANCER  . Cancer ([place])   . Chest pain   . Colon polyps 01/23/2006   type  unknown   . Constipation   . Depression   . Essential hypertension, benign   . Fatty liver   . GERD (gastroesophageal reflux disease)   . History of endometrial cancer 07/2009  . HTN (hypertension)   . Hypercholesteremia   . IBS (irritable bowel syndrome)   . Joint pain   . Leg edema   . Microscopic hematuria   . Migraine   . Mixed hyperlipidemia   . Ophthalmic herpes simplex   . Palpitations   . Proteinuria   . Tremor, essential   . Vitamin D deficiency     PAST SURGICAL HISTORY: Past Surgical History:  Procedure Laterality Date  . ABDOMINAL HYSTERECTOMY    . APPENDECTOMY    . CESAREAN SECTION    . CYST REMOVED FROM R WRIST    . HERNIA REPAIR  03/2010  . REPEAT CESAREAN SECTION    . TUBAL LIGATION    . WRIST FRACTURE SURGERY Left 07/31/2015    SOCIAL HISTORY: Social History   Tobacco Use  . Smoking status: Never Smoker  . Smokeless tobacco: Never Used  Substance Use Topics  . Alcohol use: No    Alcohol/week: 0.0 oz  . Drug use: No    FAMILY HISTORY: Family History  Problem Relation Age of Onset  . Breast cancer Mother   . Emphysema Father   . Diabetes Father   . Heart disease Father   . Alcoholism Father   . Heart disease Paternal Grandmother   . Diabetes Brother   . Heart disease Brother     ROS: Review of Systems  Constitutional: Positive for weight loss.  Endo/Heme/Allergies:       Negative hypoglycemia  Psychiatric/Behavioral: Positive for depression. Negative for suicidal ideas. The patient does not have insomnia.     PHYSICAL EXAM: Blood pressure 124/74, pulse (!) 57, temperature 97.7 F (36.5 C), temperature source Oral, height 5' 1" (1.549 m), weight 150 lb (68 kg), SpO2 96 %. Body mass index is 28.34 kg/m. Physical Exam  Constitutional: She is oriented to person, place, and time. She appears well-developed and well-nourished.  Cardiovascular: Normal rate.  Pulmonary/Chest: Effort normal.  Musculoskeletal: Normal range of motion.    Neurological: She is oriented to person, place, and time.  Skin: Skin is warm and dry.  Psychiatric: She has a normal mood and affect. Her behavior is normal.  Vitals reviewed.   RECENT LABS AND TESTS: BMET    Component Value Date/Time   NA 142 08/21/2017 0752   K 3.9 08/21/2017 0752   CL 101 08/21/2017 0752   CO2 23 08/21/2017  0752   GLUCOSE 96 08/21/2017 0752   GLUCOSE 102 (H) 12/08/2015 1037   BUN 18 08/21/2017 0752   CREATININE 0.83 08/21/2017 0752   CREATININE 0.82 12/08/2015 1037   CALCIUM 9.8 08/21/2017 0752   GFRNONAA 74 08/21/2017 0752   GFRNONAA 76 12/08/2015 1037   GFRAA 86 08/21/2017 0752   GFRAA 88 12/08/2015 1037   Lab Results  Component Value Date   HGBA1C 5.8 (H) 08/21/2017   HGBA1C 7.1 (H) 03/17/2017   HGBA1C 6.3 (H) 12/08/2015   HGBA1C 5.9 10/01/2013   HGBA1C 6.0 10/27/2012   Lab Results  Component Value Date   INSULIN 11.7 08/21/2017   INSULIN 30.9 (H) 03/17/2017   CBC    Component Value Date/Time   WBC 6.2 03/17/2017 1220   WBC 7.1 12/08/2015 1058   WBC 5.1 09/15/2014 0942   RBC 5.11 03/17/2017 1220   RBC 5.11 12/08/2015 1058   RBC 4.90 09/15/2014 0942   HGB 14.7 03/17/2017 1220   HCT 44.0 03/17/2017 1220   PLT 297 10/22/2016 1457   MCV 86 03/17/2017 1220   MCH 28.8 03/17/2017 1220   MCH 31.1 12/08/2015 1058   MCH 30.4 09/15/2014 0942   MCHC 33.4 03/17/2017 1220   MCHC 35.9 (A) 12/08/2015 1058   MCHC 34.5 09/15/2014 0942   RDW 14.6 03/17/2017 1220   LYMPHSABS 2.6 03/17/2017 1220   MONOABS 0.4 09/15/2014 0942   EOSABS 0.2 03/17/2017 1220   BASOSABS 0.0 03/17/2017 1220   Iron/TIBC/Ferritin/ %Sat    Component Value Date/Time   IRON 97 12/18/2015 1218   TIBC 394 12/18/2015 1218   FERRITIN 31 12/18/2015 1218   IRONPCTSAT 25 12/18/2015 1218   Lipid Panel     Component Value Date/Time   CHOL 213 (H) 08/21/2017 0752   TRIG 144 08/21/2017 0752   HDL 54 08/21/2017 0752   CHOLHDL 3.5 10/22/2016 1457   CHOLHDL 3.6 12/08/2015 1037    VLDL 33 (H) 12/08/2015 1037   LDLCALC 130 (H) 08/21/2017 0752   Hepatic Function Panel     Component Value Date/Time   PROT 7.1 08/21/2017 0752   ALBUMIN 4.5 08/21/2017 0752   AST 28 08/21/2017 0752   ALT 33 (H) 08/21/2017 0752   ALKPHOS 89 08/21/2017 0752   BILITOT 0.5 08/21/2017 0752      Component Value Date/Time   TSH 1.800 03/17/2017 1220   TSH 2.220 10/22/2016 1457   TSH 1.75 12/08/2015 1037    ASSESSMENT AND PLAN: Type 2 diabetes mellitus without complication, without long-term current use of insulin ([place]) - Plan: metFORMIN (GLUCOPHAGE) 500 MG tablet, glucose blood (ONETOUCH VERIO) test strip  Other depression - with emotional eating - Plan: buPROPion (WELLBUTRIN SR) 150 MG 12 hr tablet  Class 1 obesity with serious comorbidity and body mass index (BMI) of 30.0 to 30.9 in adult, unspecified obesity type - Starting BMI greater then 30  PLAN:  Diabetes II Carly Walker has been given extensive diabetes education by myself today including ideal fasting and post-prandial blood glucose readings, individual ideal Hgb A1c goals and hypoglycemia prevention. We discussed the importance of good blood sugar control to decrease the likelihood of diabetic complications such as nephropathy, neuropathy, limb loss, blindness, coronary artery disease, and death. We discussed the importance of intensive lifestyle modification including diet, exercise and weight loss as the first line treatment for diabetes. Carly Walker agrees to continue taking metformin 500 mg q AM #30 and we will refill for 1 month and we will refill glucose test strips  for 3 months. Carly Walker agrees to follow up with our clinic in 3 weeks.  Depression with Emotional Eating Behaviors We discussed behavior modification techniques today to help Carly Walker deal with her emotional eating and depression. Carly Walker agrees to continue taking Wellbutrin SR 150 mg qd #30 and we will refill for 1 month. Carly Walker agrees to follow up with our clinic in  3 weeks.  Obesity Carly Walker is currently in the action stage of change. As such, her goal is to continue with weight loss efforts She has agreed to keep a food journal with 1100-1300 calories and 80+ grams of protein daily or follow the Category 2 plan Carly Walker has been instructed to work up to a goal of 150 minutes of combined cardio and strengthening exercise per week for weight loss and overall health benefits. We discussed the following Behavioral Modification Strategies today: increasing lean protein intake, decreasing simple carbohydrates, work on meal planning and easy cooking plans and dealing with family or coworker sabotage   Carly Walker has agreed to follow up with our clinic in 3 weeks. She was informed of the importance of frequent follow up visits to maximize her success with intensive lifestyle modifications for her multiple health conditions.   OBESITY BEHAVIORAL INTERVENTION VISIT  Today's visit was # 14 out of 22.  Starting weight: 173 lbs Starting date: 03/17/17 Today's weight : 150 lbs  Today's date: 10/16/2017 Total lbs lost to date: 23 (Patients must lose 7 lbs in the first 6 months to continue with counseling)   ASK: We discussed the diagnosis of obesity with Carly Walker today and Carly Walker agreed to give [place] permission to discuss obesity behavioral modification therapy today.  ASSESS: Carly Walker has the diagnosis of obesity and her BMI today is 28.36 Carly Walker is in the action stage of change   ADVISE: Carly Walker was educated on the multiple health risks of obesity as well as the benefit of weight loss to improve her health. She was advised of the need for long term treatment and the importance of lifestyle modifications.  AGREE: Multiple dietary modification options and treatment options were discussed and  Carly Walker agreed to the above obesity treatment plan.  I, Trixie Dredge, am acting as transcriptionist for Dennard Nip, MD  I have reviewed the above documentation  for accuracy and completeness, and I agree with the above. -Dennard Nip, MD

## 2017-10-22 ENCOUNTER — Other Ambulatory Visit (INDEPENDENT_AMBULATORY_CARE_PROVIDER_SITE_OTHER): Payer: Self-pay | Admitting: Family Medicine

## 2017-10-22 DIAGNOSIS — F3289 Other specified depressive episodes: Secondary | ICD-10-CM

## 2017-11-06 ENCOUNTER — Ambulatory Visit (INDEPENDENT_AMBULATORY_CARE_PROVIDER_SITE_OTHER): Payer: Medicare Other | Admitting: Family Medicine

## 2017-11-06 VITALS — BP 125/77 | HR 63 | Temp 97.9°F | Ht 61.0 in | Wt 153.0 lb

## 2017-11-06 DIAGNOSIS — E669 Obesity, unspecified: Secondary | ICD-10-CM | POA: Diagnosis not present

## 2017-11-06 DIAGNOSIS — Z6830 Body mass index (BMI) 30.0-30.9, adult: Secondary | ICD-10-CM

## 2017-11-06 DIAGNOSIS — E119 Type 2 diabetes mellitus without complications: Secondary | ICD-10-CM | POA: Diagnosis not present

## 2017-11-06 DIAGNOSIS — F3289 Other specified depressive episodes: Secondary | ICD-10-CM | POA: Diagnosis not present

## 2017-11-06 MED ORDER — GLUCOSE BLOOD VI STRP: ORAL_STRIP | 0 refills | Status: DC

## 2017-11-06 MED ORDER — METFORMIN HCL 500 MG PO TABS: 500.0000 mg | ORAL_TABLET | Freq: Every day | ORAL | 0 refills | Status: DC

## 2017-11-06 MED ORDER — BUPROPION HCL ER (SR) 150 MG PO TB12: 150.0000 mg | ORAL_TABLET | Freq: Every day | ORAL | 0 refills | Status: DC

## 2017-11-10 NOTE — Progress Notes (Signed)
Office: [phone]  /  Fax: [phone]   HPI:   Chief Complaint: OBESITY Carly Walker is here to discuss her progress with her obesity treatment plan. She is on the keep a food journal with 1100 to 1300 calories and 80+ grams of protein daily or the Category 2 plan and is following her eating plan approximately 75 % of the time. She states she is doing yard work and walking for 60 minutes 5 times per week. Carly Walker has increased eating out and traveling and is retaining fluid. She is doing more portion control and making smarter food choices and less journaling. She is more active at home.  Her weight is 153 lb (69.4 kg) today and has had a weight gain of 3 pounds over a period of 3 weeks since her last visit. She has lost 20 lbs since starting treatment with [place].  Diabetes II Carly Walker has a diagnosis of diabetes type II. Her A1c improved to 5.8 Carly Walker denies any hypoglycemic episodes or nausea. . She has been working on intensive lifestyle modifications including diet, exercise, and weight loss to help control her blood glucose levels.  Depression with emotional eating behaviors Carly Walker feels her mood has improved on wellbutrin and feels she has done better with emotional eating overall. Carly Walker struggles with emotional eating and using food for comfort to the extent that it is negatively impacting her health. She often snacks when she is not hungry. Carly Walker sometimes feels she is out of control and then feels guilty that she made poor food choices. She has been working on behavior modification techniques to help reduce her emotional eating and has been somewhat successful. She shows no sign of suicidal or homicidal ideations.  Depression screen Carly Walker  Decreased Interest 1 0 0 0 0  Down, Depressed, Hopeless 1 0 0 0 0  PHQ - 2 Score 2 0 0 0 0  Altered sleeping 1 - - - -  Tired, decreased energy 2 - - - -  Change in appetite 3 - - - -    Feeling bad or failure about yourself  1 - - - -  Trouble concentrating 1 - - - -  Moving slowly or fidgety/restless 0 - - - -  Suicidal thoughts 0 - - - -  PHQ-9 Score 10 - - - -  Difficult doing work/chores Somewhat difficult - - - -      ALLERGIES: Allergies  Allergen Reactions  . Ace Inhibitors Cough  . Codeine Nausea Only  . Hydrocodone   . Sulfa Antibiotics Hives    MEDICATIONS: Current Outpatient Medications on File Prior to Visit  Medication Sig Dispense Refill  . acyclovir (ZOVIRAX) 400 MG tablet Take 400 mg by mouth daily. Ophthalmic HSV 1     . amLODipine (NORVASC) 10 MG tablet Take 1 tablet daily 90 tablet 3  . aspirin 81 MG tablet Take 81 mg by mouth daily.    Marland Kitchen atorvastatin (LIPITOR) 20 MG tablet Take 1 tablet (20 mg total) by mouth daily. 90 tablet 3  . Blood Glucose Monitoring Suppl (ONETOUCH VERIO) w/Device KIT 1 kit by Does not apply route 2 (two) times daily. 1 kit 0  . cetirizine (ZYRTEC) 10 MG tablet Take 10 mg by mouth daily.    . cholecalciferol (VITAMIN D) 1000 units tablet Take 1 tablet (1,000 Units total) by mouth daily.    . hydrochlorothiazide (MICROZIDE) 12.5 MG capsule TAKE 1 CAPSULE BY MOUTH EVERY DAY 90 capsule  3  . ketotifen (ZADITOR) 0.025 % ophthalmic solution 1 drop 2 (two) times daily.     . Multiple Vitamin (MULTIVITAMIN) tablet Take 1 tablet by mouth daily. Over the counter, for over 50 individuals( called 50+)     . omeprazole (PRILOSEC OTC) 20 MG tablet Take 20 mg by mouth daily.    Carly Walker MISC 1 Bottle by Does not apply route 2 (two) times daily. 60 each 0   No current facility-administered medications on file prior to visit.     PAST MEDICAL HISTORY: Past Medical History:  Diagnosis Date  . Allergic rhinitis, cause unspecified   . Allergy    SEASONAL  . Arthritis    LOWER BACK  . Back pain   . Cancer [facility])    ENDOMETRIAL CANCER  . Cancer ([place])   . Chest pain   . Colon polyps 01/23/2006   type  unknown   . Constipation   . Depression   . Essential hypertension, benign   . Fatty liver   . GERD (gastroesophageal reflux disease)   . History of endometrial cancer 07/2009  . HTN (hypertension)   . Hypercholesteremia   . IBS (irritable bowel syndrome)   . Joint pain   . Leg edema   . Microscopic hematuria   . Migraine   . Mixed hyperlipidemia   . Ophthalmic herpes simplex   . Palpitations   . Proteinuria   . Tremor, essential   . Vitamin D deficiency     PAST SURGICAL HISTORY: Past Surgical History:  Procedure Laterality Date  . ABDOMINAL HYSTERECTOMY    . APPENDECTOMY    . CESAREAN SECTION    . CYST REMOVED FROM R WRIST    . HERNIA REPAIR  03/2010  . REPEAT CESAREAN SECTION    . TUBAL LIGATION    . WRIST FRACTURE SURGERY Left 07/31/2015    SOCIAL HISTORY: Social History   Tobacco Use  . Smoking status: Never Smoker  . Smokeless tobacco: Never Used  Substance Use Topics  . Alcohol use: No    Alcohol/week: 0.0 oz  . Drug use: No    FAMILY HISTORY: Family History  Problem Relation Age of Onset  . Breast cancer Mother   . Emphysema Father   . Diabetes Father   . Heart disease Father   . Alcoholism Father   . Heart disease Paternal Grandmother   . Diabetes Brother   . Heart disease Brother     ROS: Review of Systems  Constitutional: Negative for weight loss.  Gastrointestinal: Negative for nausea.  Endo/Heme/Allergies:       Negative for hypoglycemia  Psychiatric/Behavioral: Positive for depression. Negative for suicidal ideas.    PHYSICAL EXAM: Blood pressure 125/77, pulse 63, temperature 97.9 F (36.6 C), temperature source Oral, height '5\' 1"'  (1.549 m), weight 153 lb (69.4 kg), SpO2 97 %. Body mass index is 28.91 kg/m. Physical Exam  Constitutional: She is oriented to person, place, and time. She appears well-developed and well-nourished.  Cardiovascular: Normal rate.  Pulmonary/Chest: Effort normal.  Musculoskeletal: Normal range of  motion.  Neurological: She is oriented to person, place, and time.  Skin: Skin is warm and dry.  Psychiatric: She has a normal mood and affect. Her behavior is normal.  Vitals reviewed.   RECENT LABS AND TESTS: BMET    Component Value Date/Time   NA 142 08/21/2017 0752   K 3.9 08/21/2017 0752   CL 101 08/21/2017 0752   CO2 23 08/21/2017  0752   GLUCOSE 96 08/21/2017 0752   GLUCOSE 102 (H) 12/08/2015 1037   BUN 18 08/21/2017 0752   CREATININE 0.83 08/21/2017 0752   CREATININE 0.82 12/08/2015 1037   CALCIUM 9.8 08/21/2017 0752   GFRNONAA 74 08/21/2017 0752   GFRNONAA 76 12/08/2015 1037   GFRAA 86 08/21/2017 0752   GFRAA 88 12/08/2015 1037   Lab Results  Component Value Date   HGBA1C 5.8 (H) 08/21/2017   HGBA1C 7.1 (H) 03/17/2017   HGBA1C 6.3 (H) 12/08/2015   HGBA1C 5.9 10/01/2013   HGBA1C 6.0 10/27/2012   Lab Results  Component Value Date   INSULIN 11.7 08/21/2017   INSULIN 30.9 (H) 03/17/2017   CBC    Component Value Date/Time   WBC 6.2 03/17/2017 1220   WBC 7.1 12/08/2015 1058   WBC 5.1 09/15/2014 0942   RBC 5.11 03/17/2017 1220   RBC 5.11 12/08/2015 1058   RBC 4.90 09/15/2014 0942   HGB 14.7 03/17/2017 1220   HCT 44.0 03/17/2017 1220   PLT 297 10/22/2016 1457   MCV 86 03/17/2017 1220   MCH 28.8 03/17/2017 1220   MCH 31.1 12/08/2015 1058   MCH 30.4 09/15/2014 0942   MCHC 33.4 03/17/2017 1220   MCHC 35.9 (A) 12/08/2015 1058   MCHC 34.5 09/15/2014 0942   RDW 14.6 03/17/2017 1220   LYMPHSABS 2.6 03/17/2017 1220   MONOABS 0.4 09/15/2014 0942   EOSABS 0.2 03/17/2017 1220   BASOSABS 0.0 03/17/2017 1220   Iron/TIBC/Ferritin/ %Sat    Component Value Date/Time   IRON 97 12/18/2015 1218   TIBC 394 12/18/2015 1218   FERRITIN 31 12/18/2015 1218   IRONPCTSAT 25 12/18/2015 1218   Lipid Panel     Component Value Date/Time   CHOL 213 (H) 08/21/2017 0752   TRIG 144 08/21/2017 0752   HDL 54 08/21/2017 0752   CHOLHDL 3.5 10/22/2016 1457   CHOLHDL 3.6  12/08/2015 1037   VLDL 33 (H) 12/08/2015 1037   LDLCALC 130 (H) 08/21/2017 0752   Hepatic Function Panel     Component Value Date/Time   PROT 7.1 08/21/2017 0752   ALBUMIN 4.5 08/21/2017 0752   AST 28 08/21/2017 0752   ALT 33 (H) 08/21/2017 0752   ALKPHOS 89 08/21/2017 0752   BILITOT 0.5 08/21/2017 0752      Component Value Date/Time   TSH 1.800 03/17/2017 1220   TSH 2.220 10/22/2016 1457   TSH 1.75 12/08/2015 1037   Results for Carly Walker, Carly Walker (MRN [id number]) as of 11/10/2017 16:01  Ref. Range 08/21/2017 07:52  Vitamin D, 25-Hydroxy Latest Ref Range: 30.0 - 100.0 ng/mL 69.4   ASSESSMENT AND PLAN: Type 2 diabetes mellitus without complication, without long-term current use of insulin ([facility]) - Plan: glucose blood (ONETOUCH VERIO) test strip, metFORMIN (GLUCOPHAGE) 500 MG tablet  Other depression - with emotional eating - Plan: buPROPion (WELLBUTRIN SR) 150 MG 12 hr tablet  Class 1 obesity with serious comorbidity and body mass index (BMI) of 30.0 to 30.9 in adult, unspecified obesity type - Patient started program with a BMI greater than 30  PLAN:  Diabetes II Carly Walker has been given extensive diabetes education by myself today including ideal fasting and post-prandial blood glucose readings, individual ideal Hgb A1c goals and hypoglycemia prevention. We discussed the importance of good blood sugar control to decrease the likelihood of diabetic complications such as nephropathy, neuropathy, limb loss, blindness, coronary artery disease, and death. We discussed the importance of intensive lifestyle modification including diet, exercise and weight  loss as the first line treatment for diabetes. Shaylie agrees to continue metformin 500 mg daily #30 with no refills and we will refill strips  For 3 months. Taniqua agrees to follow up at the agreed upon time.  Depression with Emotional Eating Behaviors We discussed behavior modification techniques today to help Isabele deal with her emotional  eating and depression. She has agreed to continue Wellbutrin SR 150 mg qd #30 with no refills and follow up as directed.   Obesity Shaana is currently in the action stage of change. As such, her goal is to continue with weight loss efforts She has agreed to keep a food journal with 1100 to 1300 calories and 80+ grams of protein daily Janeane has been instructed to work up to a goal of 150 minutes of combined cardio and strengthening exercise per week for weight loss and overall health benefits. We discussed the following Behavioral Modification Strategies today: increasing lean protein intake, decreasing simple carbohydrates  and work on meal planning and easy cooking plans  Kaidance has agreed to follow up with our clinic in 3 weeks. She was informed of the importance of frequent follow up visits to maximize her success with intensive lifestyle modifications for her multiple health conditions.   OBESITY BEHAVIORAL INTERVENTION VISIT  Today's visit was # 15 out of 22.  Starting weight: 173 lbs Starting date: 03/17/17 Today's weight : 153 lbs Today's date: 11/06/2017 Total lbs lost to date: 20 (Patients must lose 7 lbs in the first 6 months to continue with counseling)   ASK: We discussed the diagnosis of obesity with [name] today and Chezney agreed to give [place] permission to discuss obesity behavioral modification therapy today.  ASSESS: Evaleen has the diagnosis of obesity and her BMI today is 28.92 Xylah is in the action stage of change   ADVISE: Bettyjo was educated on the multiple health risks of obesity as well as the benefit of weight loss to improve her health. She was advised of the need for long term treatment and the importance of lifestyle modifications.  AGREE: Multiple dietary modification options and treatment options were discussed and  Kayliee agreed to the above obesity treatment plan.  I, Doreene Nest, am acting as transcriptionist for Dennard Nip,  MD  I have reviewed the above documentation for accuracy and completeness, and I agree with the above. -Dennard Nip, MD

## 2017-11-13 DIAGNOSIS — Z803 Family history of malignant neoplasm of breast: Secondary | ICD-10-CM | POA: Diagnosis not present

## 2017-11-13 DIAGNOSIS — Z1231 Encounter for screening mammogram for malignant neoplasm of breast: Secondary | ICD-10-CM | POA: Diagnosis not present

## 2017-11-13 LAB — HM MAMMOGRAPHY

## 2017-11-26 ENCOUNTER — Ambulatory Visit (INDEPENDENT_AMBULATORY_CARE_PROVIDER_SITE_OTHER): Payer: Medicare Other | Admitting: Family Medicine

## 2017-11-26 VITALS — BP 116/76 | HR 61 | Temp 98.0°F | Ht 61.0 in | Wt 151.0 lb

## 2017-11-26 DIAGNOSIS — E7849 Other hyperlipidemia: Secondary | ICD-10-CM

## 2017-11-26 DIAGNOSIS — E119 Type 2 diabetes mellitus without complications: Secondary | ICD-10-CM | POA: Diagnosis not present

## 2017-11-26 DIAGNOSIS — E669 Obesity, unspecified: Secondary | ICD-10-CM

## 2017-11-26 DIAGNOSIS — E559 Vitamin D deficiency, unspecified: Secondary | ICD-10-CM

## 2017-11-26 DIAGNOSIS — R7989 Other specified abnormal findings of blood chemistry: Secondary | ICD-10-CM | POA: Insufficient documentation

## 2017-11-26 DIAGNOSIS — R945 Abnormal results of liver function studies: Secondary | ICD-10-CM | POA: Diagnosis not present

## 2017-11-26 DIAGNOSIS — Z6830 Body mass index (BMI) 30.0-30.9, adult: Secondary | ICD-10-CM

## 2017-11-27 ENCOUNTER — Other Ambulatory Visit: Payer: Self-pay

## 2017-11-27 ENCOUNTER — Encounter: Payer: Self-pay | Admitting: Physician Assistant

## 2017-11-27 ENCOUNTER — Ambulatory Visit (INDEPENDENT_AMBULATORY_CARE_PROVIDER_SITE_OTHER): Payer: Medicare Other | Admitting: Physician Assistant

## 2017-11-27 ENCOUNTER — Ambulatory Visit (INDEPENDENT_AMBULATORY_CARE_PROVIDER_SITE_OTHER): Payer: Medicare Other

## 2017-11-27 VITALS — BP 118/72 | HR 77 | Temp 98.6°F | Resp 16 | Ht 61.0 in | Wt 153.8 lb

## 2017-11-27 DIAGNOSIS — M545 Low back pain, unspecified: Secondary | ICD-10-CM

## 2017-11-27 DIAGNOSIS — I10 Essential (primary) hypertension: Secondary | ICD-10-CM | POA: Diagnosis not present

## 2017-11-27 DIAGNOSIS — E119 Type 2 diabetes mellitus without complications: Secondary | ICD-10-CM

## 2017-11-27 DIAGNOSIS — Z23 Encounter for immunization: Secondary | ICD-10-CM

## 2017-11-27 DIAGNOSIS — E2839 Other primary ovarian failure: Secondary | ICD-10-CM | POA: Diagnosis not present

## 2017-11-27 DIAGNOSIS — M5136 Other intervertebral disc degeneration, lumbar region: Secondary | ICD-10-CM | POA: Insufficient documentation

## 2017-11-27 DIAGNOSIS — I700 Atherosclerosis of aorta: Secondary | ICD-10-CM | POA: Insufficient documentation

## 2017-11-27 LAB — COMPREHENSIVE METABOLIC PANEL
A/G RATIO: 1.8 (ref 1.2–2.2)
ALBUMIN: 4.7 g/dL (ref 3.6–4.8)
ALK PHOS: 103 IU/L (ref 39–117)
ALT: 35 IU/L — ABNORMAL HIGH (ref 0–32)
AST: 27 IU/L (ref 0–40)
BILIRUBIN TOTAL: 0.7 mg/dL (ref 0.0–1.2)
BUN/Creatinine Ratio: 25 (ref 12–28)
BUN: 23 mg/dL (ref 8–27)
CHLORIDE: 101 mmol/L (ref 96–106)
CO2: 23 mmol/L (ref 20–29)
Calcium: 9.6 mg/dL (ref 8.7–10.3)
Creatinine, Ser: 0.93 mg/dL (ref 0.57–1.00)
GFR calc Af Amer: 75 mL/min/{1.73_m2} (ref >59)
GFR calc non Af Amer: 65 mL/min/{1.73_m2} (ref >59)
GLUCOSE: 95 mg/dL (ref 65–99)
Globulin, Total: 2.6 g/dL (ref 1.5–4.5)
POTASSIUM: 3.6 mmol/L (ref 3.5–5.2)
SODIUM: 141 mmol/L (ref 134–144)
Total Protein: 7.3 g/dL (ref 6.0–8.5)

## 2017-11-27 LAB — INSULIN, RANDOM: INSULIN: 13.3 u[IU]/mL (ref 2.6–24.9)

## 2017-11-27 LAB — HEMOGLOBIN A1C
Est. average glucose Bld gHb Est-mCnc: 120 mg/dL
HEMOGLOBIN A1C: 5.8 % — AB (ref 4.8–5.6)

## 2017-11-27 LAB — LIPID PANEL WITH LDL/HDL RATIO
Cholesterol, Total: 154 mg/dL (ref 100–199)
HDL: 52 mg/dL (ref >39)
LDL CALC: 81 mg/dL (ref 0–99)
LDL/HDL RATIO: 1.6 ratio (ref 0.0–3.2)
Triglycerides: 106 mg/dL (ref 0–149)
VLDL Cholesterol Cal: 21 mg/dL (ref 5–40)

## 2017-11-27 LAB — VITAMIN D 25 HYDROXY (VIT D DEFICIENCY, FRACTURES): VIT D 25 HYDROXY: 62.2 ng/mL (ref 30.0–100.0)

## 2017-11-27 MED ORDER — AMLODIPINE BESYLATE 10 MG PO TABS: ORAL_TABLET | ORAL | 3 refills | Status: AC

## 2017-11-27 NOTE — Progress Notes (Signed)
Patient ID: Carly Walker, female    DOB: 1951/02/07, 67 y.o.   MRN: [id number]  PCP: Harrison Mons, PA-C  Chief Complaint  Patient presents with  . Back Pain    spasms off/on x 1 month     Subjective:   Presents for evaluation of intermittent back spasms for the past 4 weeks.  Pain occurs intermittently, grabbing and severe. Often occurs with sit to stand.  Sitting in the car x 40 minutes increases stiffness. Increases with frequent lifting (her grandchildren). Any increase in physical activity (mowing, walking). Using a back brace intermittently, which helps. Rest helps. Ice helps. No radicular pain (has had sciatica before).  Chronic hematuria being monitored by urology, also proteinuria managed by nephrology.    Review of Systems As above.    Patient Active Problem List   Diagnosis Date Noted  . Elevated LFTs 11/26/2017  . SCC (squamous cell carcinoma), shoulder, right 05/27/2017  . Menopausal syndrome 05/09/2017  . Class 1 obesity with serious comorbidity and body mass index (BMI) of 30.0 to 30.9 in adult 04/29/2017  . Type 2 diabetes mellitus without complication, without long-term current use of insulin ([place]) 03/31/2017  . Other fatigue 03/17/2017  . Shortness of breath on exertion 03/17/2017  . NAFLD (nonalcoholic fatty liver disease) 03/17/2017  . Murmur 03/17/2017  . Essential hypertension 03/17/2017  . Vitamin D deficiency 03/17/2017  . Hyperglycemia 03/17/2017  . Class 1 obesity with serious comorbidity and body mass index (BMI) of 32.0 to 32.9 in adult 03/17/2017  . Proteinuria 11/19/2016  . BMI 32.0-32.9,adult 11/19/2016  . Fatty liver 10/30/2016  . Esophageal reflux 09/28/2014  . Rectal bleeding 09/28/2014  . IBS (irritable colon syndrome) 09/28/2014  . Microscopic hematuria 09/04/2012  . Urge incontinence of urine 09/04/2012  . Migraine   . Allergic rhinitis   . Essential hypertension, benign   . Mixed hyperlipidemia   . History of  endometrial cancer   . Tremor, essential   . Ophthalmic herpes simplex      Prior to Admission medications   Medication Sig Start Date End Date Taking? Authorizing Provider  acyclovir (ZOVIRAX) 400 MG tablet Take 400 mg by mouth daily. Ophthalmic HSV 1    Yes [provider]  amLODipine (NORVASC) 10 MG tablet Take 1 tablet daily 10/22/16  Yes Barby Colvard, PA-C  aspirin 81 MG tablet Take 81 mg by mouth daily.   Yes [provider]  atorvastatin (LIPITOR) 20 MG tablet Take 1 tablet (20 mg total) by mouth daily. 06/24/17  Yes Martesha Niedermeier, PA-C  Blood Glucose Monitoring Suppl (ONETOUCH VERIO) w/Device KIT 1 kit by Does not apply route 2 (two) times daily. 03/31/17  Yes Beasley, Caren D, MD  buPROPion (WELLBUTRIN SR) 150 MG 12 hr tablet Take 1 tablet (150 mg total) by mouth daily. 11/06/17  Yes Beasley, Caren D, MD  cetirizine (ZYRTEC) 10 MG tablet Take 10 mg by mouth daily.   Yes [provider]  cholecalciferol (VITAMIN D) 1000 units tablet Take 1 tablet (1,000 Units total) by mouth daily. 09/25/17  Yes Beasley, Caren D, MD  glucose blood (ONETOUCH VERIO) test strip TEST BLOOD SUGARS TWICE DAILY 11/06/17  Yes Leafy Ro, Caren D, MD  hydrochlorothiazide (MICROZIDE) 12.5 MG capsule TAKE 1 CAPSULE BY MOUTH EVERY DAY 09/16/17  Yes Terryann Verbeek, PA-C  ketotifen (ZADITOR) 0.025 % ophthalmic solution 1 drop 2 (two) times daily.    Yes [provider]  metFORMIN (GLUCOPHAGE) 500 MG tablet Take 1  tablet (500 mg total) by mouth daily with breakfast. 11/06/17  Yes Dennard Nip D, MD  Multiple Vitamin (MULTIVITAMIN) tablet Take 1 tablet by mouth daily. Over the counter, for over 50 individuals( called 50+)    Yes [provider]  omeprazole (PRILOSEC OTC) 20 MG tablet Take 20 mg by mouth daily.   Yes [provider]  [facility] DELICA LANCETS 94T MISC 1 Bottle by Does not apply route 2 (two) times daily. 05/27/17  Yes Lacy Duverney M, PA-C     Allergies    Allergen Reactions  . Ace Inhibitors Cough  . Codeine Nausea Only  . Hydrocodone   . Sulfa Antibiotics Hives       Objective:  Physical Exam  Constitutional: She is oriented to person, place, and time. She appears well-developed and well-nourished. She is active and cooperative. No distress.  BP 118/72   Pulse 77   Temp 98.6 F (37 C)   Resp 16   Ht _0  (1.549 m)   Wt 153 lb 12.8 oz (69.8 kg)   SpO2 98%   BMI 29.06 kg/m   HENT:  Head: Normocephalic and atraumatic.  Right Ear: Hearing normal.  Left Ear: Hearing normal.  Eyes: Conjunctivae are normal. No scleral icterus.  Neck: Normal range of motion. Neck supple. No thyromegaly present.  Cardiovascular: Normal rate, regular rhythm and normal heart sounds.  Pulses:      Radial pulses are 2+ on the right side, and 2+ on the left side.  Pulmonary/Chest: Effort normal and breath sounds normal.  Musculoskeletal:       Lumbar back: She exhibits bony tenderness and pain. She exhibits normal range of motion, no tenderness, no swelling, no edema, no deformity, no laceration, no spasm and normal pulse.  Lymphadenopathy:       Head (right side): No tonsillar, no preauricular, no posterior auricular and no occipital adenopathy present.       Head (left side): No tonsillar, no preauricular, no posterior auricular and no occipital adenopathy present.    She has no cervical adenopathy.       Right: No supraclavicular adenopathy present.       Left: No supraclavicular adenopathy present.  Neurological: She is alert and oriented to person, place, and time. She has normal strength. No sensory deficit.  Reflex Scores:      Patellar reflexes are 2+ on the right side and 2+ on the left side.      Achilles reflexes are 2+ on the right side and 2+ on the left side. Skin: Skin is warm, dry and intact. No rash noted. No cyanosis or erythema. Nails show no clubbing.  Psychiatric: She has a normal mood and affect. Her speech is normal and  behavior is normal.           Assessment & Plan:   Problem List Items Addressed This Visit    Type 2 diabetes mellitus without complication, without long-term current use of insulin ([place])    Labs performed yesterday with medical weight management.  Hemoglobin A1c is 5.8%.  Update urine microalbumin.      Relevant Orders   Microalbumin / creatinine urine ratio   Essential hypertension, benign    Well-controlled.      Relevant Medications   amLODipine (NORVASC) 10 MG tablet    Other Visit Diagnoses    Acute midline low back pain without sciatica    -  Primary   Await radiographs.  Acetaminophen as needed.  Consider physical therapy  versus spine specialist pending radiographs.   Relevant Orders   DG Lumbar Spine Complete (Completed)   Estrogen deficiency       Relevant Orders   DG Bone Density   Need for pneumococcal vaccination       Relevant Orders   Pneumococcal conjugate vaccine 13-valent IM (Completed)       Return in about 4 months (around 03/30/2018) for re-evalaution of blood pressure, diabetes.   Fara Chute, PA-C [facility]

## 2017-11-27 NOTE — Progress Notes (Signed)
Office: [phone]  /  Fax: [phone]   HPI:   Chief Complaint: OBESITY Carly Walker is here to discuss her progress with her obesity treatment plan. She is on the keep a food journal with 1100-1300 calories and 80+ grams of protein daily and is following her eating plan approximately 50 % of the time. She states she is walking and doing yard work for 60 minutes 4-5 times per week. Carly Walker Walker continues to well with weight loss, is meeting her calories and protein goals but struggles to journal while watching her grandkids.  Her weight is 151 lb (68.5 kg) today and has had a weight loss of 2 pounds over a period of 3 weeks since her last visit. She has lost 22 lbs since starting treatment with [place].  Diabetes II Carly Walker Walker has a diagnosis of diabetes type II. Carly Walker Walker is stable on medications, she denies hypoglycemia and polyphagia improved. Last A1c was 5.8 on 08/21/17. She has been working on intensive lifestyle modifications including diet, exercise, and weight loss to help control her blood glucose levels.  Elevated LFT Carly Walker Walker has a of elevated ALT. Level greatly improved with weight loss . She denies abdominal pain or jaundice and has never been told of any liver problems in the past. She denies excessive alcohol intake.  Vitamin D Deficiency Carly Walker Walker has a diagnosis of vitamin D deficiency. She is stable on OTC Vit D and denies nausea, vomiting or muscle weakness.  Hyperlipidemia Carly Walker Walker has hyperlipidemia and has been attempting to improve her cholesterol levels with diet and Lipitor. She denies any chest pain, claudication or myalgias.  ALLERGIES: Allergies  Allergen Reactions  . Ace Inhibitors Cough  . Codeine Nausea Only  . Hydrocodone   . Sulfa Antibiotics Hives    MEDICATIONS: Current Outpatient Medications on File Prior to Visit  Medication Sig Dispense Refill  . acyclovir (ZOVIRAX) 400 MG tablet Take 400 mg by mouth daily. Ophthalmic HSV 1     . aspirin 81 MG tablet Take 81  mg by mouth daily.    Marland Kitchen atorvastatin (LIPITOR) 20 MG tablet Take 1 tablet (20 mg total) by mouth daily. 90 tablet 3  . Blood Glucose Monitoring Suppl (ONETOUCH VERIO) w/Device KIT 1 kit by Does not apply route 2 (two) times daily. 1 kit 0  . buPROPion (WELLBUTRIN SR) 150 MG 12 hr tablet Take 1 tablet (150 mg total) by mouth daily. 30 tablet 0  . cetirizine (ZYRTEC) 10 MG tablet Take 10 mg by mouth daily.    . cholecalciferol (VITAMIN D) 1000 units tablet Take 1 tablet (1,000 Units total) by mouth daily.    Marland Kitchen glucose blood (ONETOUCH VERIO) test strip TEST BLOOD SUGARS TWICE DAILY 200 each 0  . hydrochlorothiazide (MICROZIDE) 12.5 MG capsule TAKE 1 CAPSULE BY MOUTH EVERY DAY 90 capsule 3  . ketotifen (ZADITOR) 0.025 % ophthalmic solution 1 drop 2 (two) times daily.     . metFORMIN (GLUCOPHAGE) 500 MG tablet Take 1 tablet (500 mg total) by mouth daily with breakfast. 30 tablet 0  . Multiple Vitamin (MULTIVITAMIN) tablet Take 1 tablet by mouth daily. Over the counter, for over 50 individuals( called 50+)     . omeprazole (PRILOSEC OTC) 20 MG tablet Take 20 mg by mouth daily.    Carly Walker Walker DELICA LANCETS 29F MISC 1 Bottle by Does not apply route 2 (two) times daily. 60 each 0   No current facility-administered medications on file prior to visit.     PAST MEDICAL HISTORY: Past Medical  History:  Diagnosis Date  . Allergic rhinitis, cause unspecified   . Allergy    SEASONAL  . Arthritis    LOWER BACK  . Back pain   . Cancer [facility])    ENDOMETRIAL CANCER  . Cancer ([employer])   . Chest pain   . Colon polyps 01/23/2006   type unknown   . Constipation   . Depression   . Essential hypertension, benign   . Fatty liver   . GERD (gastroesophageal reflux disease)   . History of endometrial cancer 07/2009  . HTN (hypertension)   . Hypercholesteremia   . IBS (irritable bowel syndrome)   . Joint pain   . Leg edema   . Microscopic hematuria   . Migraine   . Mixed hyperlipidemia   . Ophthalmic herpes  simplex   . Palpitations   . Proteinuria   . Tremor, essential   . Vitamin D deficiency     PAST SURGICAL HISTORY: Past Surgical History:  Procedure Laterality Date  . ABDOMINAL HYSTERECTOMY    . APPENDECTOMY    . CESAREAN SECTION    . CYST REMOVED FROM R WRIST    . HERNIA REPAIR  03/2010  . REPEAT CESAREAN SECTION    . TUBAL LIGATION    . WRIST FRACTURE SURGERY Left 07/31/2015    SOCIAL HISTORY: Social History   Tobacco Use  . Smoking status: Never Smoker  . Smokeless tobacco: Never Used  Substance Use Topics  . Alcohol use: No    Alcohol/week: 0.0 oz  . Drug use: No    FAMILY HISTORY: Family History  Problem Relation Age of Onset  . Breast cancer Mother   . Emphysema Father   . Diabetes Father   . Heart disease Father   . Alcoholism Father   . Heart disease Paternal Grandmother   . Diabetes Brother   . Heart disease Brother     ROS: Review of Systems  Constitutional: Positive for weight loss.  Eyes:       Negative jaundice  Cardiovascular: Negative for chest pain and claudication.  Gastrointestinal: Negative for abdominal pain, nausea and vomiting.  Musculoskeletal: Negative for myalgias.       Negative muscle weakness  Endo/Heme/Allergies:       Positive polyphagia Negative hypoglycemia    PHYSICAL EXAM: Blood pressure 116/76, pulse 61, temperature 98 F (36.7 C), temperature source Oral, height '5\' 1"'  (1.549 m), weight 151 lb (68.5 kg), SpO2 97 %. Body mass index is 28.53 kg/m. Physical Exam  Constitutional: She is oriented to person, place, and time. She appears well-developed and well-nourished.  Cardiovascular: Normal rate.  Pulmonary/Chest: Effort normal.  Musculoskeletal: Normal range of motion.  Neurological: She is oriented to person, place, and time.  Skin: Skin is warm and dry.  Psychiatric: She has a normal mood and affect. Her behavior is normal.  Vitals reviewed.   RECENT LABS AND TESTS: BMET    Component Value Date/Time    NA 141 11/26/2017 1134   K 3.6 11/26/2017 1134   CL 101 11/26/2017 1134   CO2 23 11/26/2017 1134   GLUCOSE 95 11/26/2017 1134   GLUCOSE 102 (H) 12/08/2015 1037   BUN 23 11/26/2017 1134   CREATININE 0.93 11/26/2017 1134   CREATININE 0.82 12/08/2015 1037   CALCIUM 9.6 11/26/2017 1134   GFRNONAA 65 11/26/2017 1134   GFRNONAA 76 12/08/2015 1037   GFRAA 75 11/26/2017 1134   GFRAA 88 12/08/2015 1037   Lab Results  Component Value Date  HGBA1C 5.8 (H) 11/26/2017   HGBA1C 5.8 (H) 08/21/2017   HGBA1C 7.1 (H) 03/17/2017   HGBA1C 6.3 (H) 12/08/2015   HGBA1C 5.9 10/01/2013   Lab Results  Component Value Date   INSULIN 13.3 11/26/2017   INSULIN 11.7 08/21/2017   INSULIN 30.9 (H) 03/17/2017   CBC    Component Value Date/Time   WBC 6.2 03/17/2017 1220   WBC 7.1 12/08/2015 1058   WBC 5.1 09/15/2014 0942   RBC 5.11 03/17/2017 1220   RBC 5.11 12/08/2015 1058   RBC 4.90 09/15/2014 0942   HGB 14.7 03/17/2017 1220   HCT 44.0 03/17/2017 1220   PLT 297 10/22/2016 1457   MCV 86 03/17/2017 1220   MCH 28.8 03/17/2017 1220   MCH 31.1 12/08/2015 1058   MCH 30.4 09/15/2014 0942   MCHC 33.4 03/17/2017 1220   MCHC 35.9 (A) 12/08/2015 1058   MCHC 34.5 09/15/2014 0942   RDW 14.6 03/17/2017 1220   LYMPHSABS 2.6 03/17/2017 1220   MONOABS 0.4 09/15/2014 0942   EOSABS 0.2 03/17/2017 1220   BASOSABS 0.0 03/17/2017 1220   Iron/TIBC/Ferritin/ %Sat    Component Value Date/Time   IRON 97 12/18/2015 1218   TIBC 394 12/18/2015 1218   FERRITIN 31 12/18/2015 1218   IRONPCTSAT 25 12/18/2015 1218   Lipid Panel     Component Value Date/Time   CHOL 154 11/26/2017 1134   TRIG 106 11/26/2017 1134   HDL 52 11/26/2017 1134   CHOLHDL 3.5 10/22/2016 1457   CHOLHDL 3.6 12/08/2015 1037   VLDL 33 (H) 12/08/2015 1037   LDLCALC 81 11/26/2017 1134   Hepatic Function Panel     Component Value Date/Time   PROT 7.3 11/26/2017 1134   ALBUMIN 4.7 11/26/2017 1134   AST 27 11/26/2017 1134   ALT 35 (H)  11/26/2017 1134   ALKPHOS 103 11/26/2017 1134   BILITOT 0.7 11/26/2017 1134      Component Value Date/Time   TSH 1.800 03/17/2017 1220   TSH 2.220 10/22/2016 1457   TSH 1.75 12/08/2015 1037  Results for MYRTLE, HALLER (MRN [id number]) as of 11/27/2017 13:47  Ref. Range 08/21/2017 07:52  Vitamin D, 25-Hydroxy Latest Ref Range: 30.0 - 100.0 ng/mL 69.4    ASSESSMENT AND PLAN: Type 2 diabetes mellitus without complication, without long-term current use of insulin ([facility]) - Plan: Comprehensive metabolic panel, Hemoglobin A1c, Insulin, random  Elevated LFTs  Vitamin D deficiency - Plan: VITAMIN D 25 Hydroxy (Vit-D Deficiency, Fractures)  Other hyperlipidemia - Plan: Lipid Panel With LDL/HDL Ratio  Class 1 obesity with serious comorbidity and body mass index (BMI) of 30.0 to 30.9 in adult, unspecified obesity type - Starting BMI greater then 30  PLAN:  Diabetes II Carly Walker Walker has been given extensive diabetes education by myself today including ideal fasting and post-prandial blood glucose readings, individual ideal Hgb A1c goals and hypoglycemia prevention. We discussed the importance of good blood sugar control to decrease the likelihood of diabetic complications such as nephropathy, neuropathy, limb loss, blindness, coronary artery disease, and death. We discussed the importance of intensive lifestyle modification including diet, exercise and weight loss as the first line treatment for diabetes. We will check labs and she will continue diet and her diabetes medications. Carly Walker Walker agrees to follow up with our clinic in 3 weeks.  Elevated LFT We discussed the likely diagnosis of non alcoholic fatty liver disease today and how this condition is obesity related. Carly Walker Walker was educated on her risk of developing NASH or even liver failure  and th only proven treatment for NAFLD was weight loss. Carly Walker Walker agreed to continue with her weight loss efforts with healthier diet and exercise as an essential part of  her treatment plan. We will check labs and Carly Walker agrees to follow up with our clinic in 3 weeks.  Vitamin D Deficiency Carly Walker Walker was informed that low vitamin D levels contributes to fatigue and are associated with obesity, breast, and colon cancer. She will follow up for routine testing of vitamin D, at least 2-3 times per year. She was informed of the risk of over-replacement of vitamin D and agrees to not increase her dose unless she discusses this with [place] first. We will check labs and Carly Walker Walker agrees to follow up with our clinic in 3 weeks.  Hyperlipidemia Carly Walker Walker was informed of the American Heart Association Guidelines emphasizing intensive lifestyle modifications as the first line treatment for hyperlipidemia. We discussed many lifestyle modifications today in depth, and Carly Walker Walker will continue to work on decreasing saturated fats such as fatty red meat, butter and many fried foods. She will also increase vegetables and lean protein in her diet and continue to work on exercise and weight loss efforts. We will check labs and she will continue her medications and diet. Carly Walker agrees to follow up with our clinic in 3 weeks.  Obesity Carly Walker is currently in the action stage of change. As such, her goal is to continue with weight loss efforts She has agreed to keep a food journal with 1100-1300 calories and 80+ grams of protein daily Carly Walker Walker has been instructed to work up to a goal of 150 minutes of combined cardio and strengthening exercise per week for weight loss and overall health benefits. We discussed the following Behavioral Modification Strategies today: work on meal planning and easy cooking plans, dealing with family or coworker sabotage, and travel eating strategies    Carly Walker Walker has agreed to follow up with our clinic in 3 weeks. She was informed of the importance of frequent follow up visits to maximize her success with intensive lifestyle modifications for her multiple health  conditions.   OBESITY BEHAVIORAL INTERVENTION VISIT  Today's visit was # 16 out of 22.  Starting weight: 173 lbs Starting date: 03/17/17 Today's weight : 151 lbs  Today's date: 11/26/2017 Total lbs lost to date: 10 (Patients must lose 7 lbs in the first 6 months to continue with counseling)   ASK: We discussed the diagnosis of obesity with Carly Walker Walker today and Carly Walker Walker agreed to give [place] permission to discuss obesity behavioral modification therapy today.  ASSESS: Carly Walker Walker has the diagnosis of obesity and her BMI today is 29.08 Carly Walker Walker is in the action stage of change   ADVISE: Carly Walker Walker was educated on the multiple health risks of obesity as well as the benefit of weight loss to improve her health. She was advised of the need for long term treatment and the importance of lifestyle modifications.  AGREE: Multiple dietary modification options and treatment options were discussed and  Carly Walker Walker agreed to the above obesity treatment plan.  I, Trixie Dredge, am acting as transcriptionist for Dennard Nip, MD  I have reviewed the above documentation for accuracy and completeness, and I agree with the above. -Dennard Nip, MD

## 2017-11-27 NOTE — Patient Instructions (Signed)
     IF you received an x-ray today, you will receive an invoice from [facility]. Please contact  Radiology at [phone] with questions or concerns regarding your invoice.   IF you received labwork today, you will receive an invoice from [facility]. Please contact [facility] at [phone] with questions or concerns regarding your invoice.   Our billing staff will not be able to assist you with questions regarding bills from these companies.  You will be contacted with the lab results as soon as they are available. The fastest way to get your results is to activate your My Chart account. Instructions are located on the last page of this paperwork. If you have not heard from us regarding the results in 2 weeks, please contact this office.     

## 2017-11-29 NOTE — Assessment & Plan Note (Signed)
Well controlled 

## 2017-11-29 NOTE — Assessment & Plan Note (Signed)
Labs performed yesterday with medical weight management.  Hemoglobin A1c is 5.8%.  Update urine microalbumin.

## 2017-12-06 ENCOUNTER — Other Ambulatory Visit (INDEPENDENT_AMBULATORY_CARE_PROVIDER_SITE_OTHER): Payer: Self-pay | Admitting: Family Medicine

## 2017-12-06 DIAGNOSIS — E119 Type 2 diabetes mellitus without complications: Secondary | ICD-10-CM

## 2017-12-15 ENCOUNTER — Encounter: Payer: Self-pay | Admitting: *Deleted

## 2017-12-17 ENCOUNTER — Ambulatory Visit (INDEPENDENT_AMBULATORY_CARE_PROVIDER_SITE_OTHER): Payer: Medicare Other | Admitting: Family Medicine

## 2017-12-17 VITALS — BP 114/72 | HR 68 | Temp 97.9°F | Ht 61.0 in | Wt 154.0 lb

## 2017-12-17 DIAGNOSIS — E669 Obesity, unspecified: Secondary | ICD-10-CM

## 2017-12-17 DIAGNOSIS — E119 Type 2 diabetes mellitus without complications: Secondary | ICD-10-CM | POA: Diagnosis not present

## 2017-12-17 DIAGNOSIS — F3289 Other specified depressive episodes: Secondary | ICD-10-CM | POA: Diagnosis not present

## 2017-12-17 DIAGNOSIS — Z6830 Body mass index (BMI) 30.0-30.9, adult: Secondary | ICD-10-CM | POA: Diagnosis not present

## 2017-12-17 MED ORDER — BUPROPION HCL ER (SR) 150 MG PO TB12: 150.0000 mg | ORAL_TABLET | Freq: Every day | ORAL | 0 refills | Status: DC

## 2017-12-17 MED ORDER — METFORMIN HCL 500 MG PO TABS: 500.0000 mg | ORAL_TABLET | Freq: Every day | ORAL | 0 refills | Status: DC

## 2017-12-17 NOTE — Progress Notes (Signed)
Office: [phone]  /  Fax: [phone]   HPI:   Chief Complaint: OBESITY Carly Walker is here to discuss her progress with her obesity treatment plan. She is on the keep a food journal with 1100-1300 calories and 80+ grams of protein daily and is following her eating plan approximately 40 % of the time. She states she is walking and doing yard work for 30+ minutes 5 times per week. Carly Walker had extra challenges with watching her grandkids. She is retaining some fluid and has done well with journaling overall.  Her weight is 154 lb (69.9 kg) today and has gained 3 pounds since her last visit. She has lost 19 lbs since starting treatment with [place].  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 Early's levels are well controlled on metformin, she denies nausea, vomiting, and hypoglycemia. Recent A1c was 5.8. She has been working on intensive lifestyle modifications including diet, exercise, and weight loss to help control her blood glucose levels.  Depression with emotional eating behaviors Carly Walker's mood is stable on Wellbutrin, blood pressure is stable. Carly Walker struggles with emotional eating and using food for comfort to the extent that it is negatively impacting her health. She often snacks when she is not hungry. Carly Walker sometimes feels she is out of control and then feels guilty that she made poor food choices. She has been working on behavior modification techniques to help reduce her emotional eating and has been somewhat successful. She shows no sign of suicidal or homicidal ideations.  Depression screen [facility] 2/9 11/27/2017 03/17/2017 11/19/2016 11/15/2016 10/22/2016  Decreased Interest 0 1 0 0 0  Down, Depressed, Hopeless 0 1 0 0 0  PHQ - 2 Score 0 2 0 0 0  Altered sleeping - 1 - - -  Tired, decreased energy - 2 - - -  Change in appetite - 3 - - -  Feeling bad or failure about yourself  - 1 - - -  Trouble concentrating - 1 - - -  Moving slowly or fidgety/restless - 0 - - -    Suicidal thoughts - 0 - - -  PHQ-9 Score - 10 - - -  Difficult doing work/chores - Somewhat difficult - - -    ALLERGIES: Allergies  Allergen Reactions  . Ace Inhibitors Cough  . Codeine Nausea Only  . Hydrocodone   . Sulfa Antibiotics Hives    MEDICATIONS: Current Outpatient Medications on File Prior to Visit  Medication Sig Dispense Refill  . acyclovir (ZOVIRAX) 400 MG tablet Take 400 mg by mouth daily. Ophthalmic HSV 1     . amLODipine (NORVASC) 10 MG tablet Take 1 tablet daily 90 tablet 3  . aspirin 81 MG tablet Take 81 mg by mouth daily.    Marland Kitchen atorvastatin (LIPITOR) 20 MG tablet Take 1 tablet (20 mg total) by mouth daily. 90 tablet 3  . Blood Glucose Monitoring Suppl (ONETOUCH VERIO) w/Device KIT 1 kit by Does not apply route 2 (two) times daily. 1 kit 0  . buPROPion (WELLBUTRIN SR) 150 MG 12 hr tablet Take 1 tablet (150 mg total) by mouth daily. 30 tablet 0  . cetirizine (ZYRTEC) 10 MG tablet Take 10 mg by mouth daily.    . cholecalciferol (VITAMIN D) 1000 units tablet Take 1 tablet (1,000 Units total) by mouth daily.    Marland Kitchen glucose blood (ONETOUCH VERIO) test strip TEST BLOOD SUGARS TWICE DAILY 200 each 0  . hydrochlorothiazide (MICROZIDE) 12.5 MG capsule TAKE 1 CAPSULE BY MOUTH  EVERY DAY 90 capsule 3  . ketotifen (ZADITOR) 0.025 % ophthalmic solution 1 drop 2 (two) times daily.     . Multiple Vitamin (MULTIVITAMIN) tablet Take 1 tablet by mouth daily. Over the counter, for over 50 individuals( called 50+)     . omeprazole (PRILOSEC OTC) 20 MG tablet Take 20 mg by mouth daily.    Carly Walker MISC 1 Bottle by Does not apply route 2 (two) times daily. 60 each 0   No current facility-administered medications on file prior to visit.     PAST MEDICAL HISTORY: Past Medical History:  Diagnosis Date  . Allergic rhinitis, cause unspecified   . Allergy    SEASONAL  . Arthritis    LOWER BACK  . Back pain   . Cancer [facility])    ENDOMETRIAL CANCER  . Cancer ([place])    . Chest pain   . Colon polyps 01/23/2006   type unknown   . Constipation   . Depression   . Essential hypertension, benign   . Fatty liver   . GERD (gastroesophageal reflux disease)   . History of endometrial cancer 07/2009  . HTN (hypertension)   . Hypercholesteremia   . IBS (irritable bowel syndrome)   . Joint pain   . Leg edema   . Microscopic hematuria   . Migraine   . Mixed hyperlipidemia   . Ophthalmic herpes simplex   . Palpitations   . Proteinuria   . Tremor, essential   . Vitamin D deficiency     PAST SURGICAL HISTORY: Past Surgical History:  Procedure Laterality Date  . ABDOMINAL HYSTERECTOMY    . APPENDECTOMY    . CESAREAN SECTION    . CYST REMOVED FROM R WRIST    . HERNIA REPAIR  03/2010  . REPEAT CESAREAN SECTION    . TUBAL LIGATION    . WRIST FRACTURE SURGERY Left 07/31/2015    SOCIAL HISTORY: Social History   Tobacco Use  . Smoking status: Never Smoker  . Smokeless tobacco: Never Used  Substance Use Topics  . Alcohol use: No    Alcohol/week: 0.0 oz  . Drug use: No    FAMILY HISTORY: Family History  Problem Relation Age of Onset  . Breast cancer Mother   . Emphysema Father   . Carly Father   . Heart disease Father   . Alcoholism Father   . Heart disease Paternal Grandmother   . Carly Brother   . Heart disease Brother     ROS: Review of Systems  Constitutional: Negative for weight loss.  Gastrointestinal: Negative for nausea and vomiting.  Endo/Heme/Allergies:       Negative hypoglycemia  Psychiatric/Behavioral: Positive for depression. Negative for suicidal ideas.    PHYSICAL EXAM: Blood pressure 114/72, pulse 68, temperature 97.9 F (36.6 C), temperature source Oral, height '5\' 1"'  (1.549 m), weight 154 lb (69.9 kg), SpO2 97 %. Body mass index is 29.1 kg/m. Physical Exam  Constitutional: She is oriented to person, place, and time. She appears well-developed and well-nourished.  Cardiovascular: Normal rate.   Pulmonary/Chest: Effort normal.  Musculoskeletal: Normal range of motion.  Neurological: She is oriented to person, place, and time.  Skin: Skin is warm and dry.  Psychiatric: She has a normal mood and affect. Her behavior is normal.  Vitals reviewed.   RECENT LABS AND TESTS: BMET    Component Value Date/Time   NA 141 11/26/2017 1134   K 3.6 11/26/2017 1134   CL 101 11/26/2017 1134  CO2 23 11/26/2017 1134   GLUCOSE 95 11/26/2017 1134   GLUCOSE 102 (H) 12/08/2015 1037   BUN 23 11/26/2017 1134   CREATININE 0.93 11/26/2017 1134   CREATININE 0.82 12/08/2015 1037   CALCIUM 9.6 11/26/2017 1134   GFRNONAA 65 11/26/2017 1134   GFRNONAA 76 12/08/2015 1037   GFRAA 75 11/26/2017 1134   GFRAA 88 12/08/2015 1037   Lab Results  Component Value Date   HGBA1C 5.8 (H) 11/26/2017   HGBA1C 5.8 (H) 08/21/2017   HGBA1C 7.1 (H) 03/17/2017   HGBA1C 6.3 (H) 12/08/2015   HGBA1C 5.9 10/01/2013   Lab Results  Component Value Date   INSULIN 13.3 11/26/2017   INSULIN 11.7 08/21/2017   INSULIN 30.9 (H) 03/17/2017   CBC    Component Value Date/Time   WBC 6.2 03/17/2017 1220   WBC 7.1 12/08/2015 1058   WBC 5.1 09/15/2014 0942   RBC 5.11 03/17/2017 1220   RBC 5.11 12/08/2015 1058   RBC 4.90 09/15/2014 0942   HGB 14.7 03/17/2017 1220   HCT 44.0 03/17/2017 1220   PLT 297 10/22/2016 1457   MCV 86 03/17/2017 1220   MCH 28.8 03/17/2017 1220   MCH 31.1 12/08/2015 1058   MCH 30.4 09/15/2014 0942   MCHC 33.4 03/17/2017 1220   MCHC 35.9 (A) 12/08/2015 1058   MCHC 34.5 09/15/2014 0942   RDW 14.6 03/17/2017 1220   LYMPHSABS 2.6 03/17/2017 1220   MONOABS 0.4 09/15/2014 0942   EOSABS 0.2 03/17/2017 1220   BASOSABS 0.0 03/17/2017 1220   Iron/TIBC/Ferritin/ %Sat    Component Value Date/Time   IRON 97 12/18/2015 1218   TIBC 394 12/18/2015 1218   FERRITIN 31 12/18/2015 1218   IRONPCTSAT 25 12/18/2015 1218   Lipid Panel     Component Value Date/Time   CHOL 154 11/26/2017 1134   TRIG 106  11/26/2017 1134   HDL 52 11/26/2017 1134   CHOLHDL 3.5 10/22/2016 1457   CHOLHDL 3.6 12/08/2015 1037   VLDL 33 (H) 12/08/2015 1037   LDLCALC 81 11/26/2017 1134   Hepatic Function Panel     Component Value Date/Time   PROT 7.3 11/26/2017 1134   ALBUMIN 4.7 11/26/2017 1134   AST 27 11/26/2017 1134   ALT 35 (H) 11/26/2017 1134   ALKPHOS 103 11/26/2017 1134   BILITOT 0.7 11/26/2017 1134      Component Value Date/Time   TSH 1.800 03/17/2017 1220   TSH 2.220 10/22/2016 1457   TSH 1.75 12/08/2015 1037    ASSESSMENT AND PLAN: 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 We discussed the importance of good blood sugar control to decrease the likelihood of diabetic complications such as nephropathy, neuropathy, limb loss, blindness, coronary artery disease, and death. We discussed the importance of intensive lifestyle modification including diet, exercise and weight loss as the first line treatment for Carly. Carly Walker agrees to continue taking metformin 500 mg q AM #30 and we will refill for 1 month. Carly Walker agrees to follow up with our clinic in 4 weeks.  Depression with Emotional Eating  Behaviors We discussed behavior modification techniques today to help Carly Walker deal with her emotional eating and depression. Carly Walker agrees to continue taking Wellbutrin SR 150 mg qd #30 and we will refill for 1 month. Carly Walker agrees to follow up with our clinic in 4 weeks.  Obesity Carly Walker is currently in the  action stage of change. As such, her goal is to continue with weight loss efforts She has agreed to keep a food journal with 1100-1300 calories and 80+ grams of protein daily Carly Walker has been instructed to work up to a goal of 150 minutes of combined cardio and strengthening exercise per week for weight loss and overall health benefits. We discussed the following Behavioral Modification Strategies today: increasing lean protein intake, decreasing simple carbohydrates, dealing with family or coworker sabotage, holiday eating strategies, and travel eating strategies   Carly Walker has agreed to follow up with our clinic in 4 weeks. She was informed of the importance of frequent follow up visits to maximize her success with intensive lifestyle modifications for her multiple health conditions.   OBESITY BEHAVIORAL INTERVENTION VISIT  Today's visit was # 17 out of 22.  Starting weight: 173 lbs Starting date: 03/17/17 Today's weight : 154 lbs  Today's date: 12/17/2017 Total lbs lost to date: 29 (Patients must lose 7 lbs in the first 6 months to continue with counseling)   ASK: We discussed the diagnosis of obesity with [name] today and Torra agreed to give [place] permission to discuss obesity behavioral modification therapy today.  ASSESS: Sally-Anne has the diagnosis of obesity and her BMI today is 29.11 Kiauna is in the action stage of change   ADVISE: Carly Walker was educated on the multiple health risks of obesity as well as the benefit of weight loss to improve her health. She was advised of the need for long term treatment and the importance of lifestyle modifications.  AGREE: Multiple dietary modification options and treatment options were discussed and  Sandie agreed to the above obesity treatment plan.  I, Trixie Dredge, am acting as transcriptionist for Dennard Nip, MD  I have reviewed the above documentation for accuracy and completeness, and I agree with the above. -Dennard Nip, MD

## 2018-01-06 ENCOUNTER — Ambulatory Visit
Admission: RE | Admit: 2018-01-06 | Discharge: 2018-01-06 | Disposition: A | Discharge status: Home / Self Care | Payer: Medicare Other | Source: Ambulatory Visit | Attending: Physician Assistant | Admitting: Physician Assistant

## 2018-01-06 DIAGNOSIS — M8589 Other specified disorders of bone density and structure, multiple sites: Secondary | ICD-10-CM | POA: Diagnosis not present

## 2018-01-06 DIAGNOSIS — Z780 Asymptomatic menopausal state: Secondary | ICD-10-CM | POA: Diagnosis not present

## 2018-01-06 DIAGNOSIS — E2839 Other primary ovarian failure: Secondary | ICD-10-CM

## 2018-01-14 ENCOUNTER — Other Ambulatory Visit (INDEPENDENT_AMBULATORY_CARE_PROVIDER_SITE_OTHER): Payer: Self-pay | Admitting: Family Medicine

## 2018-01-14 ENCOUNTER — Ambulatory Visit (INDEPENDENT_AMBULATORY_CARE_PROVIDER_SITE_OTHER): Payer: Medicare Other | Admitting: Family Medicine

## 2018-01-14 VITALS — BP 129/81 | HR 78 | Temp 98.2°F | Ht 61.0 in | Wt 157.0 lb

## 2018-01-14 DIAGNOSIS — E669 Obesity, unspecified: Secondary | ICD-10-CM | POA: Diagnosis not present

## 2018-01-14 DIAGNOSIS — Z6830 Body mass index (BMI) 30.0-30.9, adult: Secondary | ICD-10-CM | POA: Diagnosis not present

## 2018-01-14 DIAGNOSIS — F3289 Other specified depressive episodes: Secondary | ICD-10-CM

## 2018-01-14 DIAGNOSIS — E119 Type 2 diabetes mellitus without complications: Secondary | ICD-10-CM | POA: Diagnosis not present

## 2018-01-14 MED ORDER — METFORMIN HCL 500 MG PO TABS: 500.0000 mg | ORAL_TABLET | Freq: Every day | ORAL | 0 refills | Status: DC

## 2018-01-14 MED ORDER — BUPROPION HCL ER (SR) 150 MG PO TB12: 150.0000 mg | ORAL_TABLET | Freq: Every day | ORAL | 0 refills | Status: DC

## 2018-01-14 NOTE — Progress Notes (Signed)
Office: [phone]  /  Fax: [phone]   HPI:   Chief Complaint: OBESITY Carly Walker is here to discuss her progress with her obesity treatment plan. She is on the keep a food journal with 1100-1300 calories and 80+ grams of protein daily and is following her eating plan approximately 20 % of the time. She states she is walking the dogs for 30 minutes 3-4 times per week. [name] has been off track with journaling last month. She is mostly portion control land smarter food choices but disappointed she gained weight. After further questions her hidden calories have crept up and protein is inadequate. She is ready to get back on track.  Her weight is 157 lb (71.2 kg) today and has gained 3 pounds since her last visit. She has lost 16 lbs since starting treatment with [place].  Diabetes II Carly Walker has a diagnosis of diabetes type II. Allex's levels are well controlled on metformin and diet. She denies hypoglycemia. Last A1c was 5.8. She has been working on intensive lifestyle modifications including diet, exercise, and weight loss to help control her blood glucose levels.  Depression with emotional eating behaviors Carly Walker's mood is stable on Wellbutrin, sleeping well and blood pressure is stable. Carly Walker struggles with emotional eating and using food for comfort to the extent that it is negatively impacting her health. She often snacks when she is not hungry. Carly Walker sometimes feels she is out of control and then feels guilty that she made poor food choices. She has been working on behavior modification techniques to help reduce her emotional eating and has been somewhat successful. She shows no sign of suicidal or homicidal ideations.  Depression screen Carly Walker 2/9 11/27/2017 03/17/2017 11/19/2016 11/15/2016 10/22/2016  Decreased Interest 0 1 0 0 0  Down, Depressed, Hopeless 0 1 0 0 0  PHQ - 2 Score 0 2 0 0 0  Altered sleeping - 1 - - -  Tired, decreased energy - 2 - - -  Change in appetite - 3 - - -    Feeling bad or failure about yourself  - 1 - - -  Trouble concentrating - 1 - - -  Moving slowly or fidgety/restless - 0 - - -  Suicidal thoughts - 0 - - -  PHQ-9 Score - 10 - - -  Difficult doing work/chores - Somewhat difficult - - -    ALLERGIES: Allergies  Allergen Reactions  . Ace Inhibitors Cough  . Codeine Nausea Only  . Hydrocodone   . Sulfa Antibiotics Hives    MEDICATIONS: Current Outpatient Medications on File Prior to Visit  Medication Sig Dispense Refill  . acyclovir (ZOVIRAX) 400 MG tablet Take 400 mg by mouth daily. Ophthalmic HSV 1     . amLODipine (NORVASC) 10 MG tablet Take 1 tablet daily 90 tablet 3  . aspirin 81 MG tablet Take 81 mg by mouth daily.    Marland Kitchen atorvastatin (LIPITOR) 20 MG tablet Take 1 tablet (20 mg total) by mouth daily. 90 tablet 3  . Blood Glucose Monitoring Suppl (ONETOUCH VERIO) w/Device KIT 1 kit by Does not apply route 2 (two) times daily. 1 kit 0  . cetirizine (ZYRTEC) 10 MG tablet Take 10 mg by mouth daily.    . cholecalciferol (VITAMIN D) 1000 units tablet Take 1 tablet (1,000 Units total) by mouth daily.    Marland Kitchen glucose blood (ONETOUCH VERIO) test strip TEST BLOOD SUGARS TWICE DAILY 200 each 0  . hydrochlorothiazide (MICROZIDE) 12.5 MG capsule TAKE 1 CAPSULE  BY MOUTH EVERY DAY 90 capsule 3  . ketotifen (ZADITOR) 0.025 % ophthalmic solution 1 drop 2 (two) times daily.     . Multiple Vitamin (MULTIVITAMIN) tablet Take 1 tablet by mouth daily. Over the counter, for over 50 individuals( called 50+)     . omeprazole (PRILOSEC OTC) 20 MG tablet Take 20 mg by mouth daily.    Carly Walker DELICA LANCETS 17P MISC 1 Bottle by Does not apply route 2 (two) times daily. 60 each 0   No current facility-administered medications on file prior to visit.     PAST MEDICAL HISTORY: Past Medical History:  Diagnosis Date  . Allergic rhinitis, cause unspecified   . Allergy    SEASONAL  . Arthritis    LOWER BACK  . Back pain   . Cancer [facility])    ENDOMETRIAL  CANCER  . Cancer ([place])   . Chest pain   . Colon polyps 01/23/2006   type unknown   . Constipation   . Depression   . Essential hypertension, benign   . Fatty liver   . GERD (gastroesophageal reflux disease)   . History of endometrial cancer 07/2009  . HTN (hypertension)   . Hypercholesteremia   . IBS (irritable bowel syndrome)   . Joint pain   . Leg edema   . Microscopic hematuria   . Migraine   . Mixed hyperlipidemia   . Ophthalmic herpes simplex   . Palpitations   . Proteinuria   . Tremor, essential   . Vitamin D deficiency     PAST SURGICAL HISTORY: Past Surgical History:  Procedure Laterality Date  . ABDOMINAL HYSTERECTOMY    . APPENDECTOMY    . CESAREAN SECTION    . CYST REMOVED FROM R WRIST    . HERNIA REPAIR  03/2010  . REPEAT CESAREAN SECTION    . TUBAL LIGATION    . WRIST FRACTURE SURGERY Left 07/31/2015    SOCIAL HISTORY: Social History   Tobacco Use  . Smoking status: Never Smoker  . Smokeless tobacco: Never Used  Substance Use Topics  . Alcohol use: No    Alcohol/week: 0.0 oz  . Drug use: No    FAMILY HISTORY: Family History  Problem Relation Age of Onset  . Breast cancer Mother   . Emphysema Father   . Diabetes Father   . Heart disease Father   . Alcoholism Father   . Heart disease Paternal Grandmother   . Diabetes Brother   . Heart disease Brother     ROS: Review of Systems  Constitutional: Negative for weight loss.  Endo/Heme/Allergies:       Negative hypoglycemia  Psychiatric/Behavioral: Positive for depression. Negative for suicidal ideas.    PHYSICAL EXAM: Blood pressure 129/81, pulse 78, temperature 98.2 F (36.8 C), temperature source Oral, height '5\' 1"'  (1.549 m), weight 157 lb (71.2 kg), SpO2 95 %. Body mass index is 29.66 kg/m. Physical Exam  Constitutional: She is oriented to person, place, and time. She appears well-developed and well-nourished.  Cardiovascular: Normal rate.  Pulmonary/Chest: Effort normal.    Musculoskeletal: Normal range of motion.  Neurological: She is oriented to person, place, and time.  Skin: Skin is warm and dry.  Psychiatric: She has a normal mood and affect. Her behavior is normal.  Vitals reviewed.   RECENT LABS AND TESTS: BMET    Component Value Date/Time   NA 141 11/26/2017 1134   K 3.6 11/26/2017 1134   CL 101 11/26/2017 1134   CO2 23  11/26/2017 1134   GLUCOSE 95 11/26/2017 1134   GLUCOSE 102 (H) 12/08/2015 1037   BUN 23 11/26/2017 1134   CREATININE 0.93 11/26/2017 1134   CREATININE 0.82 12/08/2015 1037   CALCIUM 9.6 11/26/2017 1134   GFRNONAA 65 11/26/2017 1134   GFRNONAA 76 12/08/2015 1037   GFRAA 75 11/26/2017 1134   GFRAA 88 12/08/2015 1037   Lab Results  Component Value Date   HGBA1C 5.8 (H) 11/26/2017   HGBA1C 5.8 (H) 08/21/2017   HGBA1C 7.1 (H) 03/17/2017   HGBA1C 6.3 (H) 12/08/2015   HGBA1C 5.9 10/01/2013   Lab Results  Component Value Date   INSULIN 13.3 11/26/2017   INSULIN 11.7 08/21/2017   INSULIN 30.9 (H) 03/17/2017   CBC    Component Value Date/Time   WBC 6.2 03/17/2017 1220   WBC 7.1 12/08/2015 1058   WBC 5.1 09/15/2014 0942   RBC 5.11 03/17/2017 1220   RBC 5.11 12/08/2015 1058   RBC 4.90 09/15/2014 0942   HGB 14.7 03/17/2017 1220   HCT 44.0 03/17/2017 1220   PLT 297 10/22/2016 1457   MCV 86 03/17/2017 1220   MCH 28.8 03/17/2017 1220   MCH 31.1 12/08/2015 1058   MCH 30.4 09/15/2014 0942   MCHC 33.4 03/17/2017 1220   MCHC 35.9 (A) 12/08/2015 1058   MCHC 34.5 09/15/2014 0942   RDW 14.6 03/17/2017 1220   LYMPHSABS 2.6 03/17/2017 1220   MONOABS 0.4 09/15/2014 0942   EOSABS 0.2 03/17/2017 1220   BASOSABS 0.0 03/17/2017 1220   Iron/TIBC/Ferritin/ %Sat    Component Value Date/Time   IRON 97 12/18/2015 1218   TIBC 394 12/18/2015 1218   FERRITIN 31 12/18/2015 1218   IRONPCTSAT 25 12/18/2015 1218   Lipid Panel     Component Value Date/Time   CHOL 154 11/26/2017 1134   TRIG 106 11/26/2017 1134   HDL 52  11/26/2017 1134   CHOLHDL 3.5 10/22/2016 1457   CHOLHDL 3.6 12/08/2015 1037   VLDL 33 (H) 12/08/2015 1037   LDLCALC 81 11/26/2017 1134   Hepatic Function Panel     Component Value Date/Time   PROT 7.3 11/26/2017 1134   ALBUMIN 4.7 11/26/2017 1134   AST 27 11/26/2017 1134   ALT 35 (H) 11/26/2017 1134   ALKPHOS 103 11/26/2017 1134   BILITOT 0.7 11/26/2017 1134      Component Value Date/Time   TSH 1.800 03/17/2017 1220   TSH 2.220 10/22/2016 1457   TSH 1.75 12/08/2015 1037    ASSESSMENT AND PLAN: Other depression - with emotional eating - Plan: buPROPion (WELLBUTRIN SR) 150 MG 12 hr tablet  Type 2 diabetes mellitus without complication, without long-term current use of insulin ([place]) - Plan: metFORMIN (GLUCOPHAGE) 500 MG tablet  Class 1 obesity with serious comorbidity and body mass index (BMI) of 30.0 to 30.9 in adult, unspecified obesity type - Beginning BMI >30  Other depression - with emotional eating - Plan: buPROPion (WELLBUTRIN SR) 150 MG 12 hr tablet  PLAN:  Diabetes II Skyelynn has been given extensive diabetes education by myself today including ideal fasting and post-prandial blood glucose readings, individual ideal Hgb A1c goals and hypoglycemia prevention. We discussed the importance of good blood sugar control to decrease the likelihood of diabetic complications such as nephropathy, neuropathy, limb loss, blindness, coronary artery disease, and death. We discussed the importance of intensive lifestyle modification including diet, exercise and weight loss as the first line treatment for diabetes. Geraldyn agrees to continue taking metformin 500 mg q AM #30  and we will refill for 1 month. Ceyda agrees to follow up with our clinic in 3 weeks.  Depression with Emotional Eating Behaviors We discussed behavior modification techniques today to help Jacquelynn deal with her emotional eating and depression. Mahi agrees to continue taking Wellbutrin SR 150 mg qd #30 and we will  refill for 1 month. Nastassia agrees to follow up with our clinic in 3 weeks.  Obesity Sabrena is currently in the action stage of change. As such, her goal is to continue with weight loss efforts She has agreed to keep a food journal with 1100-1300 calories and 80+ grams of protein daily Farrell has been instructed to work up to a goal of 150 minutes of combined cardio and strengthening exercise per week for weight loss and overall health benefits. We discussed the following Behavioral Modification Strategies today: increasing lean protein intake, better snacking choices, and decreasing simple carbohydrates    Bevan has agreed to follow up with our clinic in 3 weeks. She was informed of the importance of frequent follow up visits to maximize her success with intensive lifestyle modifications for her multiple health conditions.   OBESITY BEHAVIORAL INTERVENTION VISIT  Today's visit was # 18 out of 22.  Starting weight: 173 lbs Starting date: 03/17/17 Today's weight : 157 lbs  Today's date: 01/14/2018 Total lbs lost to date: 33 (Patients must lose 7 lbs in the first 6 months to continue with counseling)   ASK: We discussed the diagnosis of obesity with [name] today and Yasemin agreed to give [place] permission to discuss obesity behavioral modification therapy today.  ASSESS: Obelia has the diagnosis of obesity and her BMI today is 29.68 Natori is in the action stage of change   ADVISE: Esti was educated on the multiple health risks of obesity as well as the benefit of weight loss to improve her health. She was advised of the need for long term treatment and the importance of lifestyle modifications.  AGREE: Multiple dietary modification options and treatment options were discussed and  Madelein agreed to the above obesity treatment plan.  I, Trixie Dredge, am acting as transcriptionist for Dennard Nip, MD  I have reviewed the above documentation for accuracy and  completeness, and I agree with the above. -Dennard Nip, MD

## 2018-01-20 ENCOUNTER — Encounter (INDEPENDENT_AMBULATORY_CARE_PROVIDER_SITE_OTHER): Payer: Self-pay | Admitting: Family Medicine

## 2018-02-04 ENCOUNTER — Ambulatory Visit (INDEPENDENT_AMBULATORY_CARE_PROVIDER_SITE_OTHER): Payer: Medicare Other | Admitting: Family Medicine

## 2018-02-04 ENCOUNTER — Other Ambulatory Visit (INDEPENDENT_AMBULATORY_CARE_PROVIDER_SITE_OTHER): Payer: Self-pay | Admitting: Family Medicine

## 2018-02-04 VITALS — BP 121/75 | HR 63 | Temp 97.9°F | Ht 61.0 in | Wt 155.0 lb

## 2018-02-04 DIAGNOSIS — E119 Type 2 diabetes mellitus without complications: Secondary | ICD-10-CM

## 2018-02-04 DIAGNOSIS — M85859 Other specified disorders of bone density and structure, unspecified thigh: Secondary | ICD-10-CM

## 2018-02-04 DIAGNOSIS — Z6830 Body mass index (BMI) 30.0-30.9, adult: Secondary | ICD-10-CM

## 2018-02-04 DIAGNOSIS — F3289 Other specified depressive episodes: Secondary | ICD-10-CM

## 2018-02-04 DIAGNOSIS — E669 Obesity, unspecified: Secondary | ICD-10-CM

## 2018-02-04 MED ORDER — BUPROPION HCL ER (SR) 150 MG PO TB12: 150.0000 mg | ORAL_TABLET | Freq: Every day | ORAL | 0 refills | Status: DC

## 2018-02-04 MED ORDER — METFORMIN HCL 500 MG PO TABS: 500.0000 mg | ORAL_TABLET | Freq: Every day | ORAL | 0 refills | Status: DC

## 2018-02-05 NOTE — Progress Notes (Signed)
Office: [phone]  /  Fax: [phone]   HPI:   Chief Complaint: OBESITY Carly Walker is here to discuss her progress with her obesity treatment plan. She is on the keep a food journal with 1100 to 1300 calories and 80+ grams of protein daily and is following her eating plan approximately 57 % of the time. She states she is walking and doing yard work for 30 minutes 5 to 6 times per week. Carly Walker continues to do well with weight loss. She is working on Printmaker protein. Hunger is mostly controlled. Shaley doesn't like the texture of most meat, but she can eat fish and dairy. Her weight is 155 lb (70.3 kg) today and has had a weight loss of 2 pounds over a period of 3 weeks since her last visit. She has lost 18 lbs since starting treatment with [place].  Osteopenia Carly Walker's femoral T score is 1.5. She has been on vitamin D 1,000 IU daily, but she started calcium with vitamin D BID on her own. Her last vitamin D level was at goal.  Diabetes II Carly Walker has a diagnosis of diabetes type II. She is stable on diet and medications. Carly Walker denies any hypoglycemic episodes, nausea or vomiting. Last A1c was well controlled at 5.8 She has been working on intensive lifestyle modifications including diet, exercise, and weight loss to help control her blood glucose levels.  Depression with emotional eating behaviors Carly Walker is stable on Wellbutrin and notes decreased emotional eating. Carly Walker struggles with emotional eating and using food for comfort to the extent that it is negatively impacting her health. She often snacks when she is not hungry. Carly Walker sometimes feels she is out of control and then feels guilty that she made poor food choices. She has been working on behavior modification techniques to help reduce her emotional eating and has been somewhat successful. Carly Walker has no insomnia and blood pressure is controlled. She shows no sign of suicidal or homicidal ideations.  Depression screen Carly Walker  2/9 11/27/2017 03/17/2017 11/19/2016 11/15/2016 10/22/2016  Decreased Interest 0 1 0 0 0  Down, Depressed, Hopeless 0 1 0 0 0  PHQ - 2 Score 0 2 0 0 0  Altered sleeping - 1 - - -  Tired, decreased energy - 2 - - -  Change in appetite - 3 - - -  Feeling bad or failure about yourself  - 1 - - -  Trouble concentrating - 1 - - -  Moving slowly or fidgety/restless - 0 - - -  Suicidal thoughts - 0 - - -  PHQ-9 Score - 10 - - -  Difficult doing work/chores - Somewhat difficult - - -     ALLERGIES: Allergies  Allergen Reactions   Ace Inhibitors Cough   Codeine Nausea Only   Hydrocodone    Sulfa Antibiotics Hives    MEDICATIONS: Current Outpatient Medications on File Prior to Visit  Medication Sig Dispense Refill   acyclovir (ZOVIRAX) 400 MG tablet Take 400 mg by mouth daily. Ophthalmic HSV 1      amLODipine (NORVASC) 10 MG tablet Take 1 tablet daily 90 tablet 3   aspirin 81 MG tablet Take 81 mg by mouth daily.     atorvastatin (LIPITOR) 20 MG tablet Take 1 tablet (20 mg total) by mouth daily. 90 tablet 3   Blood Glucose Monitoring Suppl (ONETOUCH VERIO) w/Device KIT 1 kit by Does not apply route 2 (two) times daily. 1 kit 0   Calcium Carb-Cholecalciferol (CALCIUM 600+D3) 600-800  MG-UNIT TABS Take 2 tablets by mouth daily.     cetirizine (ZYRTEC) 10 MG tablet Take 10 mg by mouth daily.     glucose blood (ONETOUCH VERIO) test strip TEST BLOOD SUGARS TWICE DAILY 200 each 0   hydrochlorothiazide (MICROZIDE) 12.5 MG capsule TAKE 1 CAPSULE BY MOUTH EVERY DAY 90 capsule 3   ketotifen (ZADITOR) 0.025 % ophthalmic solution 1 drop 2 (two) times daily.      Multiple Vitamin (MULTIVITAMIN) tablet Take 1 tablet by mouth daily. Over the counter, for over 50 individuals( called 50+)      omeprazole (PRILOSEC OTC) 20 MG tablet Take 20 mg by mouth daily.     ONETOUCH DELICA LANCETS 59D MISC 1 Bottle by Does not apply route 2 (two) times daily. 60 each 0   No current facility-administered  medications on file prior to visit.     PAST MEDICAL HISTORY: Past Medical History:  Diagnosis Date   Allergic rhinitis, cause unspecified    Allergy    SEASONAL   Arthritis    LOWER BACK   Back pain    Cancer ([place])    ENDOMETRIAL CANCER   Cancer ([place])    Chest pain    Colon polyps 01/23/2006   type unknown    Constipation    Depression    Essential hypertension, benign    Fatty liver    GERD (gastroesophageal reflux disease)    History of endometrial cancer 07/2009   HTN (hypertension)    Hypercholesteremia    IBS (irritable bowel syndrome)    Joint pain    Leg edema    Microscopic hematuria    Migraine    Mixed hyperlipidemia    Ophthalmic herpes simplex    Palpitations    Proteinuria    Tremor, essential    Vitamin D deficiency     PAST SURGICAL HISTORY: Past Surgical History:  Procedure Laterality Date   ABDOMINAL HYSTERECTOMY     APPENDECTOMY     CESAREAN SECTION     CYST REMOVED FROM R WRIST     HERNIA REPAIR  03/2010   REPEAT CESAREAN SECTION     TUBAL LIGATION     WRIST FRACTURE SURGERY Left 07/31/2015    SOCIAL HISTORY: Social History   Tobacco Use   Smoking status: Never Smoker   Smokeless tobacco: Never Used  Substance Use Topics   Alcohol use: No    Alcohol/week: 0.0 oz   Drug use: No    FAMILY HISTORY: Family History  Problem Relation Age of Onset   Breast cancer Mother    Emphysema Father    Diabetes Father    Heart disease Father    Alcoholism Father    Heart disease Paternal Grandmother    Diabetes Brother    Heart disease Brother     ROS: Review of Systems  Constitutional: Positive for weight loss.  Gastrointestinal: Negative for nausea and vomiting.  Endo/Heme/Allergies:       Negative for hypoglycemia  Psychiatric/Behavioral: Positive for depression. Negative for suicidal ideas. The patient does not have insomnia.     PHYSICAL EXAM: Blood pressure 121/75, pulse 63,  temperature 97.9 F (36.6 C), temperature source Oral, height '5\' 1"'  (1.549 m), weight 155 lb (70.3 kg), SpO2 95 %. Body mass index is 29.29 kg/m. Physical Exam  Constitutional: She is oriented to person, place, and time. She appears well-developed and well-nourished.  Cardiovascular: Normal rate.  Pulmonary/Chest: Effort normal.  Musculoskeletal: Normal range of motion.  Neurological: She  is oriented to person, place, and time.  Skin: Skin is warm and dry.  Psychiatric: She has a normal mood and affect. Her behavior is normal.  Vitals reviewed.   RECENT LABS AND TESTS: BMET    Component Value Date/Time   NA 141 11/26/2017 1134   K 3.6 11/26/2017 1134   CL 101 11/26/2017 1134   CO2 23 11/26/2017 1134   GLUCOSE 95 11/26/2017 1134   GLUCOSE 102 (H) 12/08/2015 1037   BUN 23 11/26/2017 1134   CREATININE 0.93 11/26/2017 1134   CREATININE 0.82 12/08/2015 1037   CALCIUM 9.6 11/26/2017 1134   GFRNONAA 65 11/26/2017 1134   GFRNONAA 76 12/08/2015 1037   GFRAA 75 11/26/2017 1134   GFRAA 88 12/08/2015 1037   Lab Results  Component Value Date   HGBA1C 5.8 (H) 11/26/2017   HGBA1C 5.8 (H) 08/21/2017   HGBA1C 7.1 (H) 03/17/2017   HGBA1C 6.3 (H) 12/08/2015   HGBA1C 5.9 10/01/2013   Lab Results  Component Value Date   INSULIN 13.3 11/26/2017   INSULIN 11.7 08/21/2017   INSULIN 30.9 (H) 03/17/2017   CBC    Component Value Date/Time   WBC 6.2 03/17/2017 1220   WBC 7.1 12/08/2015 1058   WBC 5.1 09/15/2014 0942   RBC 5.11 03/17/2017 1220   RBC 5.11 12/08/2015 1058   RBC 4.90 09/15/2014 0942   HGB 14.7 03/17/2017 1220   HCT 44.0 03/17/2017 1220   PLT 297 10/22/2016 1457   MCV 86 03/17/2017 1220   MCH 28.8 03/17/2017 1220   MCH 31.1 12/08/2015 1058   MCH 30.4 09/15/2014 0942   MCHC 33.4 03/17/2017 1220   MCHC 35.9 (A) 12/08/2015 1058   MCHC 34.5 09/15/2014 0942   RDW 14.6 03/17/2017 1220   LYMPHSABS 2.6 03/17/2017 1220   MONOABS 0.4 09/15/2014 0942   EOSABS 0.2 03/17/2017  1220   BASOSABS 0.0 03/17/2017 1220   Iron/TIBC/Ferritin/ %Sat    Component Value Date/Time   IRON 97 12/18/2015 1218   TIBC 394 12/18/2015 1218   FERRITIN 31 12/18/2015 1218   IRONPCTSAT 25 12/18/2015 1218   Lipid Panel     Component Value Date/Time   CHOL 154 11/26/2017 1134   TRIG 106 11/26/2017 1134   HDL 52 11/26/2017 1134   CHOLHDL 3.5 10/22/2016 1457   CHOLHDL 3.6 12/08/2015 1037   VLDL 33 (H) 12/08/2015 1037   LDLCALC 81 11/26/2017 1134   Hepatic Function Panel     Component Value Date/Time   PROT 7.3 11/26/2017 1134   ALBUMIN 4.7 11/26/2017 1134   AST 27 11/26/2017 1134   ALT 35 (H) 11/26/2017 1134   ALKPHOS 103 11/26/2017 1134   BILITOT 0.7 11/26/2017 1134      Component Value Date/Time   TSH 1.800 03/17/2017 1220   TSH 2.220 10/22/2016 1457   TSH 1.75 12/08/2015 1037   Results for JASIYA, MARKIE (MRN [id number]) as of 02/05/2018 09:07  Ref. Range 11/26/2017 11:34  Vitamin D, 25-Hydroxy Latest Ref Range: 30.0 - 100.0 ng/mL 62.2   ASSESSMENT AND PLAN: Osteopenia, unspecified location  Type 2 diabetes mellitus without complication, without long-term current use of insulin (HCC) - Plan: metFORMIN (GLUCOPHAGE) 500 MG tablet  Other depression - with emotional eating - Plan: buPROPion (WELLBUTRIN SR) 150 MG 12 hr tablet  Class 1 obesity with serious comorbidity and body mass index (BMI) of 30.0 to 30.9 in adult, unspecified obesity type - Starting BMI greater then 30  PLAN:  Osteopenia Carly Walker is to discontinue  vitamin D 1,000 IU daily and continue to take calcium with vitamin D. We will recheck labs in 6 weeks.  Diabetes II Carly Walker has been given extensive diabetes education by myself today including ideal fasting and post-prandial blood glucose readings, individual ideal Hgb A1c goals and hypoglycemia prevention. We discussed the importance of good blood sugar control to decrease the likelihood of diabetic complications such as nephropathy, neuropathy, limb  loss, blindness, coronary artery disease, and death. We discussed the importance of intensive lifestyle modification including diet, exercise and weight loss as the first line treatment for diabetes. Carly Walker agrees to continue metformin 500 mg daily #30 with no refills and follow up at the agreed upon time.  Depression with Emotional Eating Behaviors We discussed behavior modification techniques today to help Carly Walker deal with her emotional eating and depression. She has agreed to take Wellbutrin SR 150 mg qd #30 with no refills and follow up as directed.  Obesity Carly Walker is currently in the action stage of change. As such, her goal is to continue with weight loss efforts She has agreed to keep a food journal with 1100 to 1300 calories and 80+ grams of protein daily Carly Walker has been instructed to work up to a goal of 150 minutes of combined cardio and strengthening exercise per week for weight loss and overall health benefits. We discussed the following Behavioral Modification Strategies today: increasing lean protein intake and work on meal planning and easy cooking plans  Carly Walker has agreed to follow up with our clinic in 3 weeks. She was informed of the importance of frequent follow up visits to maximize her success with intensive lifestyle modifications for her multiple health conditions.   OBESITY BEHAVIORAL INTERVENTION VISIT  Today's visit was # 19 out of 22.  Starting weight: 173 lbs Starting date: 03/17/17 Today's weight : 155 lbs Today's date: 02/04/18 Total lbs lost to date: 59    ASK: We discussed the diagnosis of obesity with Carly Walker today and Carly Walker agreed to give [place] permission to discuss obesity behavioral modification therapy today.  ASSESS: Carly Walker has the diagnosis of obesity and her BMI today is 29.3 Carly Walker is in the action stage of change   ADVISE: Carly Walker was educated on the multiple health risks of obesity as well as the benefit of weight loss to  improve her health. She was advised of the need for long term treatment and the importance of lifestyle modifications.  AGREE: Multiple dietary modification options and treatment options were discussed and  Joli agreed to the above obesity treatment plan.  I, Doreene Nest, am acting as transcriptionist for Dennard Nip, MD  I have reviewed the above documentation for accuracy and completeness, and I agree with the above. -Dennard Nip, MD

## 2018-02-17 DIAGNOSIS — R809 Proteinuria, unspecified: Secondary | ICD-10-CM | POA: Diagnosis not present

## 2018-02-17 DIAGNOSIS — I10 Essential (primary) hypertension: Secondary | ICD-10-CM | POA: Diagnosis not present

## 2018-02-17 DIAGNOSIS — R3129 Other microscopic hematuria: Secondary | ICD-10-CM | POA: Diagnosis not present

## 2018-02-17 DIAGNOSIS — E785 Hyperlipidemia, unspecified: Secondary | ICD-10-CM | POA: Diagnosis not present

## 2018-02-25 ENCOUNTER — Ambulatory Visit (INDEPENDENT_AMBULATORY_CARE_PROVIDER_SITE_OTHER): Payer: Medicare Other | Admitting: Family Medicine

## 2018-02-25 VITALS — BP 116/73 | HR 60 | Temp 97.9°F | Ht 61.0 in | Wt 156.0 lb

## 2018-02-25 DIAGNOSIS — E669 Obesity, unspecified: Secondary | ICD-10-CM

## 2018-02-25 DIAGNOSIS — Z6830 Body mass index (BMI) 30.0-30.9, adult: Secondary | ICD-10-CM

## 2018-02-25 DIAGNOSIS — E119 Type 2 diabetes mellitus without complications: Secondary | ICD-10-CM

## 2018-02-25 MED ORDER — METFORMIN HCL 500 MG PO TABS: 500.0000 mg | ORAL_TABLET | Freq: Two times a day (BID) | ORAL | 0 refills | Status: DC

## 2018-02-25 NOTE — Progress Notes (Signed)
Office: [phone]  /  Fax: [phone]   HPI:   Chief Complaint: OBESITY Carly Walker is here to discuss her progress with her obesity treatment plan. She is on the keep a food journal with 1100-1300 calories and 80+ grams of protein daily and is following her eating plan approximately 80 % of the time. She states she is walking and moving for 30 minutes 5 times per week. Carly Walker has been mindful about her food but not always journaling. She still struggles to meet her protein goal.  Her weight is 156 lb (70.8 kg) today and has gained 1 pound since her last visit. She has lost 17 lbs since starting treatment with [place].  Diabetes II Carly Walker has a diagnosis of diabetes type II. Carly Walker is on metformin and doing well but she still notes evening polyphagia. She denies any hypoglycemic episodes. Last A1c was 5.8. She has been working on intensive lifestyle modifications including diet, exercise, and weight loss to help control her blood glucose levels.  ALLERGIES: Allergies  Allergen Reactions  . Ace Inhibitors Cough  . Codeine Nausea Only  . Hydrocodone   . Sulfa Antibiotics Hives    MEDICATIONS: Current Outpatient Medications on File Prior to Visit  Medication Sig Dispense Refill  . acyclovir (ZOVIRAX) 400 MG tablet Take 400 mg by mouth daily. Ophthalmic HSV 1     . amLODipine (NORVASC) 10 MG tablet Take 1 tablet daily 90 tablet 3  . aspirin 81 MG tablet Take 81 mg by mouth daily.    Marland Kitchen atorvastatin (LIPITOR) 20 MG tablet Take 1 tablet (20 mg total) by mouth daily. 90 tablet 3  . Blood Glucose Monitoring Suppl (ONETOUCH VERIO) w/Device KIT 1 kit by Does not apply route 2 (two) times daily. 1 kit 0  . buPROPion (WELLBUTRIN SR) 150 MG 12 hr tablet Take 1 tablet (150 mg total) by mouth daily. 30 tablet 0  . Calcium Carb-Cholecalciferol (CALCIUM 600+D3) 600-800 MG-UNIT TABS Take 2 tablets by mouth daily.    . cetirizine (ZYRTEC) 10 MG tablet Take 10 mg by mouth daily.    Marland Kitchen glucose blood  (ONETOUCH VERIO) test strip TEST BLOOD SUGARS TWICE DAILY 200 each 0  . hydrochlorothiazide (MICROZIDE) 12.5 MG capsule TAKE 1 CAPSULE BY MOUTH EVERY DAY 90 capsule 3  . ketotifen (ZADITOR) 0.025 % ophthalmic solution 1 drop 2 (two) times daily.     . Multiple Vitamin (MULTIVITAMIN) tablet Take 1 tablet by mouth daily. Over the counter, for over 50 individuals( called 50+)     . omeprazole (PRILOSEC OTC) 20 MG tablet Take 20 mg by mouth daily.    Carly Walker 1 Bottle by Does not apply route 2 (two) times daily. 60 each 0   No current facility-administered medications on file prior to visit.     PAST MEDICAL HISTORY: Past Medical History:  Diagnosis Date  . Allergic rhinitis, cause unspecified   . Allergy    SEASONAL  . Arthritis    LOWER BACK  . Back pain   . Cancer [facility])    ENDOMETRIAL CANCER  . Cancer ([place])   . Chest pain   . Colon polyps 01/23/2006   type unknown   . Constipation   . Depression   . Essential hypertension, benign   . Fatty liver   . GERD (gastroesophageal reflux disease)   . History of endometrial cancer 07/2009  . HTN (hypertension)   . Hypercholesteremia   . IBS (irritable bowel syndrome)   .  Joint pain   . Leg edema   . Microscopic hematuria   . Migraine   . Mixed hyperlipidemia   . Ophthalmic herpes simplex   . Palpitations   . Proteinuria   . Tremor, essential   . Vitamin D deficiency     PAST SURGICAL HISTORY: Past Surgical History:  Procedure Laterality Date  . ABDOMINAL HYSTERECTOMY    . APPENDECTOMY    . CESAREAN SECTION    . CYST REMOVED FROM R WRIST    . HERNIA REPAIR  03/2010  . REPEAT CESAREAN SECTION    . TUBAL LIGATION    . WRIST FRACTURE SURGERY Left 07/31/2015    SOCIAL HISTORY: Social History   Tobacco Use  . Smoking status: Never Smoker  . Smokeless tobacco: Never Used  Substance Use Topics  . Alcohol use: No    Alcohol/week: 0.0 standard drinks  . Drug use: No    FAMILY HISTORY: Family  History  Problem Relation Age of Onset  . Breast cancer Mother   . Emphysema Father   . Diabetes Father   . Heart disease Father   . Alcoholism Father   . Heart disease Paternal Grandmother   . Diabetes Brother   . Heart disease Brother     ROS: Review of Systems  Constitutional: Negative for weight loss.  Endo/Heme/Allergies:       Positive polyphagia Negative hypoglycemia    PHYSICAL EXAM: Blood pressure 116/73, pulse 60, temperature 97.9 F (36.6 C), temperature source Oral, height '5\' 1"'$  (1.549 m), weight 156 lb (70.8 kg), SpO2 97 %. Body mass index is 29.48 kg/m. Physical Exam  Constitutional: She is oriented to person, place, and time. She appears well-developed and well-nourished.  Cardiovascular: Normal rate.  Pulmonary/Chest: Effort normal.  Musculoskeletal: Normal range of motion.  Neurological: She is oriented to person, place, and time.  Skin: Skin is warm and dry.  Psychiatric: She has a normal mood and affect. Her behavior is normal.  Vitals reviewed.   RECENT LABS AND TESTS: BMET    Component Value Date/Time   NA 141 11/26/2017 1134   K 3.6 11/26/2017 1134   CL 101 11/26/2017 1134   CO2 23 11/26/2017 1134   GLUCOSE 95 11/26/2017 1134   GLUCOSE 102 (H) 12/08/2015 1037   BUN 23 11/26/2017 1134   CREATININE 0.93 11/26/2017 1134   CREATININE 0.82 12/08/2015 1037   CALCIUM 9.6 11/26/2017 1134   GFRNONAA 65 11/26/2017 1134   GFRNONAA 76 12/08/2015 1037   GFRAA 75 11/26/2017 1134   GFRAA 88 12/08/2015 1037   Lab Results  Component Value Date   HGBA1C 5.8 (H) 11/26/2017   HGBA1C 5.8 (H) 08/21/2017   HGBA1C 7.1 (H) 03/17/2017   HGBA1C 6.3 (H) 12/08/2015   HGBA1C 5.9 10/01/2013   Lab Results  Component Value Date   INSULIN 13.3 11/26/2017   INSULIN 11.7 08/21/2017   INSULIN 30.9 (H) 03/17/2017   CBC    Component Value Date/Time   WBC 6.2 03/17/2017 1220   WBC 7.1 12/08/2015 1058   WBC 5.1 09/15/2014 0942   RBC 5.11 03/17/2017 1220   RBC  5.11 12/08/2015 1058   RBC 4.90 09/15/2014 0942   HGB 14.7 03/17/2017 1220   HCT 44.0 03/17/2017 1220   PLT 297 10/22/2016 1457   MCV 86 03/17/2017 1220   MCH 28.8 03/17/2017 1220   MCH 31.1 12/08/2015 1058   MCH 30.4 09/15/2014 0942   MCHC 33.4 03/17/2017 1220   MCHC 35.9 (A) 12/08/2015  1058   MCHC 34.5 09/15/2014 0942   RDW 14.6 03/17/2017 1220   LYMPHSABS 2.6 03/17/2017 1220   MONOABS 0.4 09/15/2014 0942   EOSABS 0.2 03/17/2017 1220   BASOSABS 0.0 03/17/2017 1220   Iron/TIBC/Ferritin/ %Sat    Component Value Date/Time   IRON 97 12/18/2015 1218   TIBC 394 12/18/2015 1218   FERRITIN 31 12/18/2015 1218   IRONPCTSAT 25 12/18/2015 1218   Lipid Panel     Component Value Date/Time   CHOL 154 11/26/2017 1134   TRIG 106 11/26/2017 1134   HDL 52 11/26/2017 1134   CHOLHDL 3.5 10/22/2016 1457   CHOLHDL 3.6 12/08/2015 1037   VLDL 33 (H) 12/08/2015 1037   LDLCALC 81 11/26/2017 1134   Hepatic Function Panel     Component Value Date/Time   PROT 7.3 11/26/2017 1134   ALBUMIN 4.7 11/26/2017 1134   AST 27 11/26/2017 1134   ALT 35 (H) 11/26/2017 1134   ALKPHOS 103 11/26/2017 1134   BILITOT 0.7 11/26/2017 1134      Component Value Date/Time   TSH 1.800 03/17/2017 1220   TSH 2.220 10/22/2016 1457   TSH 1.75 12/08/2015 1037    ASSESSMENT AND PLAN: Type 2 diabetes mellitus without complication, without long-term current use of insulin ([place]) - Plan: metFORMIN (GLUCOPHAGE) 500 MG tablet  Class 1 obesity with serious comorbidity and body mass index (BMI) of 30.0 to 30.9 in adult, unspecified obesity type - Starting BMI greater then 30  PLAN:  Diabetes II Carly Walker has been given extensive diabetes education by myself today including ideal fasting and post-prandial blood glucose readings, individual ideal Hgb A1c goals and hypoglycemia prevention. We discussed the importance of good blood sugar control to decrease the likelihood of diabetic complications such as nephropathy,  neuropathy, limb loss, blindness, coronary artery disease, and death. We discussed the importance of intensive lifestyle modification including diet, exercise and weight loss as the first line treatment for diabetes. Carly Walker agrees to continue to increase metformin to 500 mg BID #60 with no refills. Carly Walker agrees to follow up with our clinic in 2 to 3 weeks and we will check labs at that time.  Obesity Carly Walker is currently in the action stage of change. As such, her goal is to continue with weight loss efforts She has agreed to keep a food journal with 1100-1300 calories and 80+ grams of protein daily Carly Walker has been instructed to work up to a goal of 150 minutes of combined cardio and strengthening exercise per week for weight loss and overall health benefits. We discussed the following Behavioral Modification Strategies today: increasing lean protein intake and decreasing simple carbohydrates    Carly Walker has agreed to follow up with our clinic in 2 to 3 weeks. She was informed of the importance of frequent follow up visits to maximize her success with intensive lifestyle modifications for her multiple health conditions.   OBESITY BEHAVIORAL INTERVENTION VISIT  Today's visit was # 20.   Starting weight: 173 lbs Starting date: 03/17/17 Today's weight : 156 lbs  Today's date: 02/25/2018 Total lbs lost to date: 17 At least 15 minutes were spent on discussing the following behavioral intervention visit.   ASK: We discussed the diagnosis of obesity with Carly Walker today and Carly Walker agreed to give [place] permission to discuss obesity behavioral modification therapy today.  ASSESS: Carly Walker has the diagnosis of obesity and her BMI today is 29.49 Carly Walker is in the action stage of change   ADVISE: Carly Walker was educated on  the multiple health risks of obesity as well as the benefit of weight loss to improve her health. She was advised of the need for long term treatment and the importance of  lifestyle modifications to improve her current health and to decrease her risk of future health problems.  AGREE: Multiple dietary modification options and treatment options were discussed and  Dyanara agreed to follow the recommendations documented in the above note.  ARRANGE: Carly Walker was educated on the importance of frequent visits to treat obesity as outlined per CMS and USPSTF guidelines and agreed to schedule her next follow up appointment today.  I, Trixie Dredge, am acting as transcriptionist for Dennard Nip, MD  I have reviewed the above documentation for accuracy and completeness, and I agree with the above. -Dennard Nip, MD

## 2018-03-11 ENCOUNTER — Encounter: Payer: Self-pay | Admitting: Physician Assistant

## 2018-03-11 DIAGNOSIS — Z23 Encounter for immunization: Secondary | ICD-10-CM | POA: Diagnosis not present

## 2018-03-18 ENCOUNTER — Ambulatory Visit (INDEPENDENT_AMBULATORY_CARE_PROVIDER_SITE_OTHER): Payer: Medicare Other | Admitting: Family Medicine

## 2018-03-18 VITALS — BP 134/79 | HR 63 | Temp 97.8°F | Ht 61.0 in | Wt 156.0 lb

## 2018-03-18 DIAGNOSIS — E669 Obesity, unspecified: Secondary | ICD-10-CM

## 2018-03-18 DIAGNOSIS — Z6830 Body mass index (BMI) 30.0-30.9, adult: Secondary | ICD-10-CM

## 2018-03-18 DIAGNOSIS — R7303 Prediabetes: Secondary | ICD-10-CM

## 2018-03-18 DIAGNOSIS — E559 Vitamin D deficiency, unspecified: Secondary | ICD-10-CM | POA: Diagnosis not present

## 2018-03-18 DIAGNOSIS — F3289 Other specified depressive episodes: Secondary | ICD-10-CM | POA: Diagnosis not present

## 2018-03-18 MED ORDER — BUPROPION HCL ER (SR) 150 MG PO TB12: 150.0000 mg | ORAL_TABLET | Freq: Every day | ORAL | 0 refills | Status: DC

## 2018-03-19 LAB — COMPREHENSIVE METABOLIC PANEL
A/G RATIO: 1.8 (ref 1.2–2.2)
ALT: 32 IU/L (ref 0–32)
AST: 22 IU/L (ref 0–40)
Albumin: 4.5 g/dL (ref 3.6–4.8)
Alkaline Phosphatase: 110 IU/L (ref 39–117)
BILIRUBIN TOTAL: 0.4 mg/dL (ref 0.0–1.2)
BUN / CREAT RATIO: 29 — AB (ref 12–28)
BUN: 23 mg/dL (ref 8–27)
CHLORIDE: 101 mmol/L (ref 96–106)
CO2: 25 mmol/L (ref 20–29)
Calcium: 9.4 mg/dL (ref 8.7–10.3)
Creatinine, Ser: 0.78 mg/dL (ref 0.57–1.00)
GFR calc non Af Amer: 79 mL/min/{1.73_m2} (ref >59)
GFR, EST AFRICAN AMERICAN: 92 mL/min/{1.73_m2} (ref >59)
Globulin, Total: 2.5 g/dL (ref 1.5–4.5)
Glucose: 86 mg/dL (ref 65–99)
POTASSIUM: 3.9 mmol/L (ref 3.5–5.2)
Sodium: 142 mmol/L (ref 134–144)
TOTAL PROTEIN: 7 g/dL (ref 6.0–8.5)

## 2018-03-19 LAB — HEMOGLOBIN A1C
Est. average glucose Bld gHb Est-mCnc: 123 mg/dL
Hgb A1c MFr Bld: 5.9 % — ABNORMAL HIGH (ref 4.8–5.6)

## 2018-03-19 LAB — VITAMIN D 25 HYDROXY (VIT D DEFICIENCY, FRACTURES): Vit D, 25-Hydroxy: 51.6 ng/mL (ref 30.0–100.0)

## 2018-03-19 LAB — INSULIN, RANDOM: INSULIN: 19.3 u[IU]/mL (ref 2.6–24.9)

## 2018-03-19 NOTE — Progress Notes (Signed)
Office: [phone]  /  Fax: [phone]   HPI:   Chief Complaint: OBESITY Carly Walker is here to discuss her progress with her obesity treatment plan. She is on the keep a food journal with 1100 to 1300 calories and 80+ grams of protein daily and is following her eating plan approximately 60 % of the time. She states she is walking and doing yard work 30 minutes 5 to 6 times per week. Carly Walker has done well with maintaining weight, even with increased traveling and celebration eating over the last month. She is ready to get back to journaling. Her weight is 156 lb (70.8 kg) today and she has maintained weight over a period of 3 weeks since her last visit. She has lost 17 lbs since starting treatment with [place].  Vitamin D deficiency Carly Walker has a diagnosis of vitamin D deficiency. She is currently taking calcium with vit D. Her last vitamin D level was at goal and fatigue has improved. She denies nausea, vomiting or muscle weakness.  Pre-Diabetes Carly Walker has a diagnosis of prediabetes based on her elevated Hgb A1c and was informed this puts her at greater risk of developing diabetes. We increased her dose of metformin to two times daily at her last visit and Carly Walker is tolerating this well. She continues to work on diet and exercise to decrease risk of diabetes. She denies nausea or hypoglycemia.  Depression with emotional eating behaviors Carly Walker is struggling with emotional eating and using food for comfort to the extent that it is negatively impacting her health. She often snacks when she is not hungry. Carly Walker sometimes feels she is out of control and then feels guilty that she made poor food choices. She has been working on behavior modification techniques to help reduce her emotional eating and has been somewhat successful. Her mood has improved on Wellbutrin and she feels more in control of emotional eating. She shows no sign of suicidal or homicidal ideations.  Depression screen [facility] 2/9  11/27/2017 03/17/2017 11/19/2016 11/15/2016 10/22/2016  Decreased Interest 0 1 0 0 0  Down, Depressed, Hopeless 0 1 0 0 0  PHQ - 2 Score 0 2 0 0 0  Altered sleeping - 1 - - -  Tired, decreased energy - 2 - - -  Change in appetite - 3 - - -  Feeling bad or failure about yourself  - 1 - - -  Trouble concentrating - 1 - - -  Moving slowly or fidgety/restless - 0 - - -  Suicidal thoughts - 0 - - -  PHQ-9 Score - 10 - - -  Difficult doing work/chores - Somewhat difficult - - -      ALLERGIES: Allergies  Allergen Reactions  . Ace Inhibitors Cough  . Codeine Nausea Only  . Hydrocodone   . Sulfa Antibiotics Hives    MEDICATIONS: Current Outpatient Medications on File Prior to Visit  Medication Sig Dispense Refill  . acyclovir (ZOVIRAX) 400 MG tablet Take 400 mg by mouth daily. Ophthalmic HSV 1     . amLODipine (NORVASC) 10 MG tablet Take 1 tablet daily 90 tablet 3  . aspirin 81 MG tablet Take 81 mg by mouth daily.    Marland Kitchen atorvastatin (LIPITOR) 20 MG tablet Take 1 tablet (20 mg total) by mouth daily. 90 tablet 3  . Blood Glucose Monitoring Suppl (ONETOUCH VERIO) w/Device KIT 1 kit by Does not apply route 2 (two) times daily. 1 kit 0  . Calcium Carb-Cholecalciferol (CALCIUM 600+D3) 600-800 MG-UNIT TABS  Take 2 tablets by mouth daily.    . cetirizine (ZYRTEC) 10 MG tablet Take 10 mg by mouth daily.    Marland Kitchen glucose blood (ONETOUCH VERIO) test strip TEST BLOOD SUGARS TWICE DAILY 200 each 0  . hydrochlorothiazide (MICROZIDE) 12.5 MG capsule TAKE 1 CAPSULE BY MOUTH EVERY DAY 90 capsule 3  . ketotifen (ZADITOR) 0.025 % ophthalmic solution 1 drop 2 (two) times daily.     . metFORMIN (GLUCOPHAGE) 500 MG tablet Take 1 tablet (500 mg total) by mouth 2 (two) times daily with a meal. 60 tablet 0  . Multiple Vitamin (MULTIVITAMIN) tablet Take 1 tablet by mouth daily. Over the counter, for over 50 individuals( called 50+)     . omeprazole (PRILOSEC OTC) 20 MG tablet Take 20 mg by mouth daily.    Carly Walker  DELICA LANCETS 03E MISC 1 Bottle by Does not apply route 2 (two) times daily. 60 each 0   No current facility-administered medications on file prior to visit.     PAST MEDICAL HISTORY: Past Medical History:  Diagnosis Date  . Allergic rhinitis, cause unspecified   . Allergy    SEASONAL  . Arthritis    LOWER BACK  . Back pain   . Cancer [facility])    ENDOMETRIAL CANCER  . Cancer ([place])   . Chest pain   . Colon polyps 01/23/2006   type unknown   . Constipation   . Depression   . Essential hypertension, benign   . Fatty liver   . GERD (gastroesophageal reflux disease)   . History of endometrial cancer 07/2009  . HTN (hypertension)   . Hypercholesteremia   . IBS (irritable bowel syndrome)   . Joint pain   . Leg edema   . Microscopic hematuria   . Migraine   . Mixed hyperlipidemia   . Ophthalmic herpes simplex   . Palpitations   . Proteinuria   . Tremor, essential   . Vitamin D deficiency     PAST SURGICAL HISTORY: Past Surgical History:  Procedure Laterality Date  . ABDOMINAL HYSTERECTOMY    . APPENDECTOMY    . CESAREAN SECTION    . CYST REMOVED FROM R WRIST    . HERNIA REPAIR  03/2010  . REPEAT CESAREAN SECTION    . TUBAL LIGATION    . WRIST FRACTURE SURGERY Left 07/31/2015    SOCIAL HISTORY: Social History   Tobacco Use  . Smoking status: Never Smoker  . Smokeless tobacco: Never Used  Substance Use Topics  . Alcohol use: No    Alcohol/week: 0.0 standard drinks  . Drug use: No    FAMILY HISTORY: Family History  Problem Relation Age of Onset  . Breast cancer Mother   . Emphysema Father   . Diabetes Father   . Heart disease Father   . Alcoholism Father   . Heart disease Paternal Grandmother   . Diabetes Brother   . Heart disease Brother     ROS: Review of Systems  Constitutional: Negative for weight loss.  Gastrointestinal: Negative for nausea and vomiting.  Musculoskeletal:       Negative for muscle weakness  Endo/Heme/Allergies:        Neqative for hypoglycemia  Psychiatric/Behavioral: Positive for depression. Negative for suicidal ideas.    PHYSICAL EXAM: Blood pressure 134/79, pulse 63, temperature 97.8 F (36.6 C), temperature source Oral, height '5\' 1"'  (1.549 m), weight 156 lb (70.8 kg), SpO2 97 %. Body mass index is 29.48 kg/m. Physical Exam  Constitutional: She  is oriented to person, place, and time. She appears well-developed and well-nourished.  Cardiovascular: Normal rate.  Pulmonary/Chest: Effort normal.  Musculoskeletal: Normal range of motion.  Neurological: She is oriented to person, place, and time.  Skin: Skin is warm and dry.  Psychiatric: She has a normal mood and affect. Her behavior is normal.  Vitals reviewed.   RECENT LABS AND TESTS: BMET    Component Value Date/Time   NA 142 03/18/2018 1250   K 3.9 03/18/2018 1250   CL 101 03/18/2018 1250   CO2 25 03/18/2018 1250   GLUCOSE 86 03/18/2018 1250   GLUCOSE 102 (H) 12/08/2015 1037   BUN 23 03/18/2018 1250   CREATININE 0.78 03/18/2018 1250   CREATININE 0.82 12/08/2015 1037   CALCIUM 9.4 03/18/2018 1250   GFRNONAA 79 03/18/2018 1250   GFRNONAA 76 12/08/2015 1037   GFRAA 92 03/18/2018 1250   GFRAA 88 12/08/2015 1037   Lab Results  Component Value Date   HGBA1C 5.9 (H) 03/18/2018   HGBA1C 5.8 (H) 11/26/2017   HGBA1C 5.8 (H) 08/21/2017   HGBA1C 7.1 (H) 03/17/2017   HGBA1C 6.3 (H) 12/08/2015   Lab Results  Component Value Date   INSULIN 19.3 03/18/2018   INSULIN 13.3 11/26/2017   INSULIN 11.7 08/21/2017   INSULIN 30.9 (H) 03/17/2017   CBC    Component Value Date/Time   WBC 6.2 03/17/2017 1220   WBC 7.1 12/08/2015 1058   WBC 5.1 09/15/2014 0942   RBC 5.11 03/17/2017 1220   RBC 5.11 12/08/2015 1058   RBC 4.90 09/15/2014 0942   HGB 14.7 03/17/2017 1220   HCT 44.0 03/17/2017 1220   PLT 297 10/22/2016 1457   MCV 86 03/17/2017 1220   MCH 28.8 03/17/2017 1220   MCH 31.1 12/08/2015 1058   MCH 30.4 09/15/2014 0942   MCHC 33.4  03/17/2017 1220   MCHC 35.9 (A) 12/08/2015 1058   MCHC 34.5 09/15/2014 0942   RDW 14.6 03/17/2017 1220   LYMPHSABS 2.6 03/17/2017 1220   MONOABS 0.4 09/15/2014 0942   EOSABS 0.2 03/17/2017 1220   BASOSABS 0.0 03/17/2017 1220   Iron/TIBC/Ferritin/ %Sat    Component Value Date/Time   IRON 97 12/18/2015 1218   TIBC 394 12/18/2015 1218   FERRITIN 31 12/18/2015 1218   IRONPCTSAT 25 12/18/2015 1218   Lipid Panel     Component Value Date/Time   CHOL 154 11/26/2017 1134   TRIG 106 11/26/2017 1134   HDL 52 11/26/2017 1134   CHOLHDL 3.5 10/22/2016 1457   CHOLHDL 3.6 12/08/2015 1037   VLDL 33 (H) 12/08/2015 1037   LDLCALC 81 11/26/2017 1134   Hepatic Function Panel     Component Value Date/Time   PROT 7.0 03/18/2018 1250   ALBUMIN 4.5 03/18/2018 1250   AST 22 03/18/2018 1250   ALT 32 03/18/2018 1250   ALKPHOS 110 03/18/2018 1250   BILITOT 0.4 03/18/2018 1250      Component Value Date/Time   TSH 1.800 03/17/2017 1220   TSH 2.220 10/22/2016 1457   TSH 1.75 12/08/2015 1037   Results for DALIA, JOLLIE (MRN [id number]) as of 03/19/2018 13:08  Ref. Range 11/26/2017 11:34  Vitamin D, 25-Hydroxy Latest Ref Range: 30.0 - 100.0 ng/mL 62.2   ASSESSMENT AND PLAN: Other depression - with emotional eating - Plan: buPROPion (WELLBUTRIN SR) 150 MG 12 hr tablet  Vitamin D deficiency - Plan: VITAMIN D 25 Hydroxy (Vit-D Deficiency, Fractures)  Prediabetes - Plan: Comprehensive metabolic panel, Hemoglobin A1c, Insulin, random  Class  1 obesity with serious comorbidity and body mass index (BMI) of 30.0 to 30.9 in adult, unspecified obesity type  PLAN:  Vitamin D Deficiency Carly Walker was informed that low vitamin D levels contributes to fatigue and are associated with obesity, breast, and colon cancer. She will continue to take calcium with Vit D 600-800 mg daily and will follow up for routine testing of vitamin D, at least 2-3 times per year. She was informed of the risk of over-replacement  of vitamin D and agrees to not increase her dose unless she discusses this with [place] first. We will check labs and Carly Walker agrees to follow up as directed.  Pre-Diabetes Carly Walker will continue to work on weight loss, exercise, and decreasing simple carbohydrates in her diet to help decrease the risk of diabetes. We dicussed metformin including benefits and risks. She was informed that eating too many simple carbohydrates or too many calories at one sitting increases the likelihood of GI side effects. Carly Walker will continue metformin for now and a prescription was not written today. We will check labs today and Carly Walker agreed to follow up with [place] as directed to monitor her progress.  Depression with Emotional Eating Behaviors We discussed behavior modification techniques today to help Carly Walker deal with her emotional eating and depression. She has agreed to continue Wellbutrin SR 150 mg qd #30 with no refills and follow up as directed.  Obesity Carly Walker is currently in the action stage of change. As such, her goal is to continue with weight loss efforts She has agreed to keep a food journal with 1100 to 1300 calories and 80+ grams of protein daily Carly Walker has been instructed to work up to a goal of 150 minutes of combined cardio and strengthening exercise per week for weight loss and overall health benefits. We discussed the following Behavioral Modification Strategies today: better snacking choices, increasing lean protein intake, work on meal planning and easy cooking plans and emotional eating strategies  Carly Walker has agreed to follow up with our clinic in 3 weeks. She was informed of the importance of frequent follow up visits to maximize her success with intensive lifestyle modifications for her multiple health conditions.   OBESITY BEHAVIORAL INTERVENTION VISIT  Today's visit was # 21   Starting weight: 173 lbs Starting date: 03/17/17 Today's weight : 156 lbs  Today's date: 03/18/2018 Total lbs  lost to date: 17 At least 15 minutes were spent on discussing the following behavioral intervention visit.   ASK: We discussed the diagnosis of obesity with [name] today and Breigh agreed to give [place] permission to discuss obesity behavioral modification therapy today.  ASSESS: Karigan has the diagnosis of obesity and her BMI today is 29.49 Solangel is in the action stage of change   ADVISE: Stepheni was educated on the multiple health risks of obesity as well as the benefit of weight loss to improve her health. She was advised of the need for long term treatment and the importance of lifestyle modifications to improve her current health and to decrease her risk of future health problems.  AGREE: Multiple dietary modification options and treatment options were discussed and  Kynzlie agreed to follow the recommendations documented in the above note.  ARRANGE: Epifania was educated on the importance of frequent visits to treat obesity as outlined per CMS and USPSTF guidelines and agreed to schedule her next follow up appointment today.  Corey Skains, am acting as transcriptionist for Dennard Nip, MD  I have reviewed the above  documentation for accuracy and completeness, and I agree with the above. -Dennard Nip, MD

## 2018-03-31 ENCOUNTER — Other Ambulatory Visit (INDEPENDENT_AMBULATORY_CARE_PROVIDER_SITE_OTHER): Payer: Self-pay | Admitting: Family Medicine

## 2018-03-31 DIAGNOSIS — E119 Type 2 diabetes mellitus without complications: Secondary | ICD-10-CM

## 2018-04-08 ENCOUNTER — Ambulatory Visit (INDEPENDENT_AMBULATORY_CARE_PROVIDER_SITE_OTHER): Payer: Medicare Other | Admitting: Family Medicine

## 2018-04-08 VITALS — BP 117/75 | HR 64 | Ht 61.0 in | Wt 156.0 lb

## 2018-04-08 DIAGNOSIS — Z6830 Body mass index (BMI) 30.0-30.9, adult: Secondary | ICD-10-CM | POA: Diagnosis not present

## 2018-04-08 DIAGNOSIS — F3289 Other specified depressive episodes: Secondary | ICD-10-CM

## 2018-04-08 DIAGNOSIS — E559 Vitamin D deficiency, unspecified: Secondary | ICD-10-CM

## 2018-04-08 DIAGNOSIS — E669 Obesity, unspecified: Secondary | ICD-10-CM | POA: Diagnosis not present

## 2018-04-08 DIAGNOSIS — E119 Type 2 diabetes mellitus without complications: Secondary | ICD-10-CM

## 2018-04-08 MED ORDER — BUPROPION HCL ER (SR) 150 MG PO TB12: 150.0000 mg | ORAL_TABLET | Freq: Every day | ORAL | 0 refills | Status: DC

## 2018-04-08 NOTE — Progress Notes (Signed)
Office: [phone]  /  Fax: [phone]   HPI:   Chief Complaint: OBESITY Carly Walker is here to discuss her progress with her obesity treatment plan. She is keeping a food journal with 1100 to 1300 calories and 80 grams of protein and is following her eating plan approximately 40 % of the time. She states she is walking and doing yard work 30 minutes 4 times per week. Carly Walker is struggling with increased dining out with travel. [place] and [place] buy retirement home. She is bored with the Pescatarian and fish plan.  Her weight is 156 lb (70.8 kg) today and has not lost weight since her last visit. She has lost 17 lbs since starting treatment with [place].  Vitamin D deficiency Carly Walker has a diagnosis of vitamin D deficiency. Her vitamin D level is stable and she is not currently taking vit D. She denies nausea, vomiting or muscle weakness.  Depression with emotional eating behaviors Carly Walker is struggling with emotional eating and using food for comfort to the extent that it is negatively impacting her health. Her depression is improved and the bupropion is working well with no side effects. She is more cognizant of emotional eating.  Diabetes II Carly Walker has a diagnosis of diabetes type II. Her last A1c was slightly elevated at 5.9. Carly Walker has been eating more carbs. She is tolerating metformin well with no side effects. She has been working on intensive lifestyle modifications including diet, exercise, and weight loss to help control her blood glucose levels.  ALLERGIES: Allergies  Allergen Reactions  . Ace Inhibitors Cough  . Codeine Nausea Only  . Hydrocodone   . Sulfa Antibiotics Hives    MEDICATIONS: Current Outpatient Medications on File Prior to Visit  Medication Sig Dispense Refill  . acyclovir (ZOVIRAX) 400 MG tablet Take 400 mg by mouth daily. Ophthalmic HSV 1     . amLODipine (NORVASC) 10 MG tablet Take 1 tablet daily 90 tablet 3    . aspirin 81 MG tablet Take 81 mg by mouth daily.    Marland Kitchen atorvastatin (LIPITOR) 20 MG tablet Take 1 tablet (20 mg total) by mouth daily. 90 tablet 3  . Blood Glucose Monitoring Suppl (ONETOUCH VERIO) w/Device KIT 1 kit by Does not apply route 2 (two) times daily. 1 kit 0  . Calcium Carb-Cholecalciferol (CALCIUM 600+D3) 600-800 MG-UNIT TABS Take 2 tablets by mouth daily.    . cetirizine (ZYRTEC) 10 MG tablet Take 10 mg by mouth daily.    Marland Kitchen glucose blood (ONETOUCH VERIO) test strip TEST BLOOD SUGARS TWICE DAILY 200 each 0  . hydrochlorothiazide (MICROZIDE) 12.5 MG capsule TAKE 1 CAPSULE BY MOUTH EVERY DAY 90 capsule 3  . ketotifen (ZADITOR) 0.025 % ophthalmic solution 1 drop 2 (two) times daily.     . metFORMIN (GLUCOPHAGE) 500 MG tablet TAKE 1 TABLET BY MOUTH 2 TIMES DAILY WITH A MEAL. 60 tablet 0  . Multiple Vitamin (MULTIVITAMIN) tablet Take 1 tablet by mouth daily. Over the counter, for over 50 individuals( called 50+)     . omeprazole (PRILOSEC OTC) 20 MG tablet Take 20 mg by mouth daily.    Carly Walker DELICA LANCETS 31R MISC 1 Bottle by Does not apply route 2 (two) times daily. 60 each 0   No current facility-administered medications on file prior to visit.     PAST MEDICAL HISTORY: Past Medical History:  Diagnosis Date  . Allergic rhinitis, cause unspecified   . Allergy  SEASONAL  . Arthritis    LOWER BACK  . Back pain   . Cancer [facility])    ENDOMETRIAL CANCER  . Cancer ([place])   . Chest pain   . Colon polyps 01/23/2006   type unknown   . Constipation   . Depression   . Essential hypertension, benign   . Fatty liver   . GERD (gastroesophageal reflux disease)   . History of endometrial cancer 07/2009  . HTN (hypertension)   . Hypercholesteremia   . IBS (irritable bowel syndrome)   . Joint pain   . Leg edema   . Microscopic hematuria   . Migraine   . Mixed hyperlipidemia   . Ophthalmic herpes simplex   . Palpitations   . Proteinuria   . Tremor, essential   . Vitamin D  deficiency     PAST SURGICAL HISTORY: Past Surgical History:  Procedure Laterality Date  . ABDOMINAL HYSTERECTOMY    . APPENDECTOMY    . CESAREAN SECTION    . CYST REMOVED FROM R WRIST    . HERNIA REPAIR  03/2010  . REPEAT CESAREAN SECTION    . TUBAL LIGATION    . WRIST FRACTURE SURGERY Left 07/31/2015    SOCIAL HISTORY: Social History   Tobacco Use  . Smoking status: Never Smoker  . Smokeless tobacco: Never Used  Substance Use Topics  . Alcohol use: No    Alcohol/week: 0.0 standard drinks  . Drug use: No    FAMILY HISTORY: Family History  Problem Relation Age of Onset  . Breast cancer Mother   . Emphysema Father   . Diabetes Father   . Heart disease Father   . Alcoholism Father   . Heart disease Paternal Grandmother   . Diabetes Brother   . Heart disease Brother     ROS: Review of Systems  Constitutional: Negative for weight loss.  Gastrointestinal: Negative for nausea and vomiting.  Musculoskeletal:       Negative for muscle weakness.  Psychiatric/Behavioral: Positive for depression.    PHYSICAL EXAM: Blood pressure 117/75, pulse 64, height '5\' 1"'  (1.549 m), weight 156 lb (70.8 kg), SpO2 97 %. Body mass index is 29.48 kg/m. Physical Exam  Constitutional: She is oriented to person, place, and time. She appears well-developed and well-nourished.  Cardiovascular: Normal rate.  Pulmonary/Chest: Effort normal.  Musculoskeletal: Normal range of motion.  Neurological: She is oriented to person, place, and time.  Skin: Skin is warm and dry.  Psychiatric: She has a normal mood and affect. Her behavior is normal.  Vitals reviewed.   RECENT LABS AND TESTS: BMET    Component Value Date/Time   NA 142 03/18/2018 1250   K 3.9 03/18/2018 1250   CL 101 03/18/2018 1250   CO2 25 03/18/2018 1250   GLUCOSE 86 03/18/2018 1250   GLUCOSE 102 (H) 12/08/2015 1037   BUN 23 03/18/2018 1250   CREATININE 0.78 03/18/2018 1250   CREATININE 0.82 12/08/2015 1037   CALCIUM  9.4 03/18/2018 1250   GFRNONAA 79 03/18/2018 1250   GFRNONAA 76 12/08/2015 1037   GFRAA 92 03/18/2018 1250   GFRAA 88 12/08/2015 1037   Lab Results  Component Value Date   HGBA1C 5.9 (H) 03/18/2018   HGBA1C 5.8 (H) 11/26/2017   HGBA1C 5.8 (H) 08/21/2017   HGBA1C 7.1 (H) 03/17/2017   HGBA1C 6.3 (H) 12/08/2015   Lab Results  Component Value Date   INSULIN 19.3 03/18/2018   INSULIN 13.3 11/26/2017   INSULIN 11.7 08/21/2017  INSULIN 30.9 (H) 03/17/2017   CBC    Component Value Date/Time   WBC 6.2 03/17/2017 1220   WBC 7.1 12/08/2015 1058   WBC 5.1 09/15/2014 0942   RBC 5.11 03/17/2017 1220   RBC 5.11 12/08/2015 1058   RBC 4.90 09/15/2014 0942   HGB 14.7 03/17/2017 1220   HCT 44.0 03/17/2017 1220   PLT 297 10/22/2016 1457   MCV 86 03/17/2017 1220   MCH 28.8 03/17/2017 1220   MCH 31.1 12/08/2015 1058   MCH 30.4 09/15/2014 0942   MCHC 33.4 03/17/2017 1220   MCHC 35.9 (A) 12/08/2015 1058   MCHC 34.5 09/15/2014 0942   RDW 14.6 03/17/2017 1220   LYMPHSABS 2.6 03/17/2017 1220   MONOABS 0.4 09/15/2014 0942   EOSABS 0.2 03/17/2017 1220   BASOSABS 0.0 03/17/2017 1220   Iron/TIBC/Ferritin/ %Sat    Component Value Date/Time   IRON 97 12/18/2015 1218   TIBC 394 12/18/2015 1218   FERRITIN 31 12/18/2015 1218   IRONPCTSAT 25 12/18/2015 1218   Lipid Panel     Component Value Date/Time   CHOL 154 11/26/2017 1134   TRIG 106 11/26/2017 1134   HDL 52 11/26/2017 1134   CHOLHDL 3.5 10/22/2016 1457   CHOLHDL 3.6 12/08/2015 1037   VLDL 33 (H) 12/08/2015 1037   LDLCALC 81 11/26/2017 1134   Hepatic Function Panel     Component Value Date/Time   PROT 7.0 03/18/2018 1250   ALBUMIN 4.5 03/18/2018 1250   AST 22 03/18/2018 1250   ALT 32 03/18/2018 1250   ALKPHOS 110 03/18/2018 1250   BILITOT 0.4 03/18/2018 1250      Component Value Date/Time   TSH 1.800 03/17/2017 1220   TSH 2.220 10/22/2016 1457   TSH 1.75 12/08/2015 1037   Results for LACHLAN, PELTO (MRN [id number]) as  of 04/08/2018 11:14  Ref. Range 03/18/2018 12:50  Vitamin D, 25-Hydroxy Latest Ref Range: 30.0 - 100.0 ng/mL 51.6   ASSESSMENT AND PLAN: Vitamin D deficiency  Type 2 diabetes mellitus without complication, without long-term current use of insulin (HCC)  Other depression - with emotional eating - Plan: buPROPion (WELLBUTRIN SR) 150 MG 12 hr tablet  Class 1 obesity with serious comorbidity and body mass index (BMI) of 30.0 to 30.9 in adult, unspecified obesity type - Starting BMI greater then 30  PLAN:  Vitamin D Deficiency Carly Walker was informed that low vitamin D levels contributes to fatigue and are associated with obesity, breast, and colon cancer. She agrees to restart to take prescription Vit D '@50' ,000 IU once monthly and no refills are needed today. She will follow up for routine testing of vitamin D, at least 2-3 times per year. She was informed of the risk of over-replacement of vitamin D and agrees to not increase her dose unless she discusses this with [place] first. Carly Walker will follow up in 3 weeks.  Depression with Emotional Eating Behaviors We discussed behavior modification techniques today to help Carly Walker deal with her emotional eating and depression. She has agreed to continue to take Wellbutrin SR 150 mg qd #30 with no refills and agreed to follow up as directed in 3 weeks.  Diabetes II Carly Walker has been given extensive diabetes education by myself today including ideal fasting and post-prandial blood glucose readings, individual ideal Hgb A1c goals and hypoglycemia prevention. We discussed the importance of good blood sugar control to decrease the likelihood of diabetic complications such as nephropathy, neuropathy, limb loss, blindness, coronary artery disease, and death. We discussed the  importance of intensive lifestyle modification including diet, exercise and weight loss as the first line treatment for diabetes. Carly Walker agrees to work on reducing carbs and will continue her  metformin with no prescription needed. She will follow up at the agreed upon time.  Obesity Carly Walker is currently in the action stage of change. As such, her goal is to continue with weight loss efforts. She has agreed to keep a food journal with 1100 to 1300 calories and 80 grams of protein daily. We discussed protein choices and adding in more variety, such as veggie burgers. She agrees to pack healthy high protein snacks. Carly Walker has been instructed to walk and do yard work and work up to a goal of 150 minutes of combined cardio and strengthening exercise per week for weight loss and overall health benefits. We discussed the following Behavioral Modification Strategies today: increasing Carly Walker protein intake, decreasing simple carbohydrates, increase H2O intake, decrease eating out, better snacking choices, travel eating strategies, and planning for success.  Carly Walker has agreed to follow up with our clinic in 3 weeks. She was informed of the importance of frequent follow up visits to maximize her success with intensive lifestyle modifications for her multiple health conditions.   OBESITY BEHAVIORAL INTERVENTION VISIT  Today's visit was # 22  Starting weight: 173 lbs Starting date: 03/17/17 Today's weight : Weight: 156 lb (70.8 kg)  Today's date: 04/08/2018 Total lbs lost to date: 17 At least 15 minutes were spent on discussing the following behavioral intervention visit.   ASK: We discussed the diagnosis of obesity with Carly Walker today and Carly Walker agreed to give [place] permission to discuss obesity behavioral modification therapy today.  ASSESS: Carly Walker has the diagnosis of obesity and her BMI today is 29.49. Carly Walker is in the action stage of change.   ADVISE: Carly Walker was educated on the multiple health risks of obesity as well as the benefit of weight loss to improve her health. She was advised of the need for long term treatment and the importance of lifestyle modifications to  improve her current health and to decrease her risk of future health problems.  AGREE: Multiple dietary modification options and treatment options were discussed and Carly Walker agreed to follow the recommendations documented in the above note.  ARRANGE: Carly Walker was educated on the importance of frequent visits to treat obesity as outlined per CMS and USPSTF guidelines and agreed to schedule her next follow up appointment today.  I, Marcille Blanco, am acting as transcriptionist for Starlyn Skeans, MD  I have reviewed the above documentation for accuracy and completeness, and I agree with the above. -Dennard Nip, MD

## 2018-04-28 ENCOUNTER — Other Ambulatory Visit (INDEPENDENT_AMBULATORY_CARE_PROVIDER_SITE_OTHER): Payer: Self-pay | Admitting: Family Medicine

## 2018-04-28 DIAGNOSIS — E119 Type 2 diabetes mellitus without complications: Secondary | ICD-10-CM

## 2018-04-29 ENCOUNTER — Ambulatory Visit (INDEPENDENT_AMBULATORY_CARE_PROVIDER_SITE_OTHER): Payer: Medicare Other | Admitting: Family Medicine

## 2018-04-29 VITALS — BP 144/79 | HR 66 | Temp 97.8°F | Ht 61.0 in | Wt 157.0 lb

## 2018-04-29 DIAGNOSIS — E119 Type 2 diabetes mellitus without complications: Secondary | ICD-10-CM | POA: Diagnosis not present

## 2018-04-29 DIAGNOSIS — F3289 Other specified depressive episodes: Secondary | ICD-10-CM

## 2018-04-29 DIAGNOSIS — Z6830 Body mass index (BMI) 30.0-30.9, adult: Secondary | ICD-10-CM

## 2018-04-29 DIAGNOSIS — E669 Obesity, unspecified: Secondary | ICD-10-CM | POA: Diagnosis not present

## 2018-04-29 DIAGNOSIS — R7303 Prediabetes: Secondary | ICD-10-CM

## 2018-04-29 MED ORDER — BUPROPION HCL ER (SR) 200 MG PO TB12: 200.0000 mg | ORAL_TABLET | Freq: Every day | ORAL | 0 refills | Status: DC

## 2018-04-29 MED ORDER — METFORMIN HCL 500 MG PO TABS: ORAL_TABLET | ORAL | 0 refills | Status: DC

## 2018-05-04 NOTE — Progress Notes (Signed)
Office: [phone]  /  Fax: [phone]   HPI:   Chief Complaint: OBESITY Carly Walker is here to discuss her progress with her obesity treatment plan. She is keeping a food journal with 1100 to 1300 calories and 80 grams of  protein and is following her eating plan approximately 20 % of the time. She states she is getting in more steps daily. Carly Walker has struggled more to stay motivated. She is not going much and her meal planning has decreased. She is up 7 pounds from her lowest weight and would like to get back to that weight before the holidays.  Her weight is 157 lb (71.2 kg) today and has had a weight gain of 1 pound over a period of 3 weeks since her last visit. She has lost 16 lbs since starting treatment with [place].  Depression with emotional eating behaviors Carly Walker is struggling with emotional eating and using food for comfort to the extent that it is negatively impacting her health. She notes increased stress and fatigue. She feels more scattered and less organized. She is on Wellbutrin, but is not sure it is still helping.  Pre-Diabetes Carly Walker has a diagnosis of pre-diabetes based on her elevated Hgb A1c and was informed this puts her at greater risk of developing diabetes. She has done better remembering to take her 2nd dose of metformin, but is struggling with decreasing simple carbs. She is taking metformin currently and continues to work on diet and exercise to decrease risk of diabetes. She denies nausea, vomiting, or hypoglycemia.  ALLERGIES: Allergies  Allergen Reactions  . Ace Inhibitors Cough  . Codeine Nausea Only  . Hydrocodone   . Sulfa Antibiotics Hives    MEDICATIONS: Current Outpatient Medications on File Prior to Visit  Medication Sig Dispense Refill  . acyclovir (ZOVIRAX) 400 MG tablet Take 400 mg by mouth daily. Ophthalmic HSV 1     . amLODipine (NORVASC) 10 MG tablet Take 1 tablet daily 90 tablet 3  . aspirin 81 MG tablet Take 81 mg by mouth daily.    Marland Kitchen  atorvastatin (LIPITOR) 20 MG tablet Take 1 tablet (20 mg total) by mouth daily. 90 tablet 3  . Blood Glucose Monitoring Suppl (ONETOUCH VERIO) w/Device KIT 1 kit by Does not apply route 2 (two) times daily. 1 kit 0  . Calcium Carb-Cholecalciferol (CALCIUM 600+D3) 600-800 MG-UNIT TABS Take 2 tablets by mouth daily.    . cetirizine (ZYRTEC) 10 MG tablet Take 10 mg by mouth daily.    Marland Kitchen glucose blood (ONETOUCH VERIO) test strip TEST BLOOD SUGARS TWICE DAILY 200 each 0  . hydrochlorothiazide (MICROZIDE) 12.5 MG capsule TAKE 1 CAPSULE BY MOUTH EVERY DAY 90 capsule 3  . ketotifen (ZADITOR) 0.025 % ophthalmic solution 1 drop 2 (two) times daily.     . Multiple Vitamin (MULTIVITAMIN) tablet Take 1 tablet by mouth daily. Over the counter, for over 50 individuals( called 50+)     . omeprazole (PRILOSEC OTC) 20 MG tablet Take 20 mg by mouth daily.    Carly Walker DELICA LANCETS 24O MISC 1 Bottle by Does not apply route 2 (two) times daily. 60 each 0  . Vitamin D, Ergocalciferol, (DRISDOL) 50000 units CAPS capsule Take 50,000 Units by mouth every 30 (thirty) days.     No current facility-administered medications on file prior to visit.     PAST MEDICAL HISTORY: Past Medical History:  Diagnosis Date  . Allergic rhinitis, cause unspecified   . Allergy    SEASONAL  .  Arthritis    LOWER BACK  . Back pain   . Cancer [facility])    ENDOMETRIAL CANCER  . Cancer ([place])   . Chest pain   . Colon polyps 01/23/2006   type unknown   . Constipation   . Depression   . Essential hypertension, benign   . Fatty liver   . GERD (gastroesophageal reflux disease)   . History of endometrial cancer 07/2009  . HTN (hypertension)   . Hypercholesteremia   . IBS (irritable bowel syndrome)   . Joint pain   . Leg edema   . Microscopic hematuria   . Migraine   . Mixed hyperlipidemia   . Ophthalmic herpes simplex   . Palpitations   . Proteinuria   . Tremor, essential   . Vitamin D deficiency     PAST SURGICAL  HISTORY: Past Surgical History:  Procedure Laterality Date  . ABDOMINAL HYSTERECTOMY    . APPENDECTOMY    . CESAREAN SECTION    . CYST REMOVED FROM R WRIST    . HERNIA REPAIR  03/2010  . REPEAT CESAREAN SECTION    . TUBAL LIGATION    . WRIST FRACTURE SURGERY Left 07/31/2015    SOCIAL HISTORY: Social History   Tobacco Use  . Smoking status: Never Smoker  . Smokeless tobacco: Never Used  Substance Use Topics  . Alcohol use: No    Alcohol/week: 0.0 standard drinks  . Drug use: No    FAMILY HISTORY: Family History  Problem Relation Age of Onset  . Breast cancer Mother   . Emphysema Father   . Diabetes Father   . Heart disease Father   . Alcoholism Father   . Heart disease Paternal Grandmother   . Diabetes Brother   . Heart disease Brother     ROS: Review of Systems  Constitutional: Positive for malaise/fatigue. Negative for weight loss.  Gastrointestinal: Negative for nausea and vomiting.  Endo/Heme/Allergies:       Negative for hypoglycemia.  Psychiatric/Behavioral: Positive for depression.    PHYSICAL EXAM: Blood pressure (!) 144/79, pulse 66, temperature 97.8 F (36.6 C), temperature source Oral, height _0  (1.549 m), weight 157 lb (71.2 kg), SpO2 96 %. Body mass index is 29.66 kg/m. Physical Exam  Constitutional: She is oriented to person, place, and time. She appears well-developed and well-nourished.  Cardiovascular: Normal rate.  Pulmonary/Chest: Effort normal.  Musculoskeletal: Normal range of motion.  Neurological: She is oriented to person, place, and time.  Skin: Skin is warm and dry.  Psychiatric: She has a normal mood and affect. Her behavior is normal.  Vitals reviewed.   RECENT LABS AND TESTS: BMET    Component Value Date/Time   NA 142 03/18/2018 1250   K 3.9 03/18/2018 1250   CL 101 03/18/2018 1250   CO2 25 03/18/2018 1250   GLUCOSE 86 03/18/2018 1250   GLUCOSE 102 (H) 12/08/2015 1037   BUN 23 03/18/2018 1250   CREATININE 0.78  03/18/2018 1250   CREATININE 0.82 12/08/2015 1037   CALCIUM 9.4 03/18/2018 1250   GFRNONAA 79 03/18/2018 1250   GFRNONAA 76 12/08/2015 1037   GFRAA 92 03/18/2018 1250   GFRAA 88 12/08/2015 1037   Lab Results  Component Value Date   HGBA1C 5.9 (H) 03/18/2018   HGBA1C 5.8 (H) 11/26/2017   HGBA1C 5.8 (H) 08/21/2017   HGBA1C 7.1 (H) 03/17/2017   HGBA1C 6.3 (H) 12/08/2015   Lab Results  Component Value Date   INSULIN 19.3 03/18/2018   INSULIN  13.3 11/26/2017   INSULIN 11.7 08/21/2017   INSULIN 30.9 (H) 03/17/2017   CBC    Component Value Date/Time   WBC 6.2 03/17/2017 1220   WBC 7.1 12/08/2015 1058   WBC 5.1 09/15/2014 0942   RBC 5.11 03/17/2017 1220   RBC 5.11 12/08/2015 1058   RBC 4.90 09/15/2014 0942   HGB 14.7 03/17/2017 1220   HCT 44.0 03/17/2017 1220   PLT 297 10/22/2016 1457   MCV 86 03/17/2017 1220   MCH 28.8 03/17/2017 1220   MCH 31.1 12/08/2015 1058   MCH 30.4 09/15/2014 0942   MCHC 33.4 03/17/2017 1220   MCHC 35.9 (A) 12/08/2015 1058   MCHC 34.5 09/15/2014 0942   RDW 14.6 03/17/2017 1220   LYMPHSABS 2.6 03/17/2017 1220   MONOABS 0.4 09/15/2014 0942   EOSABS 0.2 03/17/2017 1220   BASOSABS 0.0 03/17/2017 1220   Iron/TIBC/Ferritin/ %Sat    Component Value Date/Time   IRON 97 12/18/2015 1218   TIBC 394 12/18/2015 1218   FERRITIN 31 12/18/2015 1218   IRONPCTSAT 25 12/18/2015 1218   Lipid Panel     Component Value Date/Time   CHOL 154 11/26/2017 1134   TRIG 106 11/26/2017 1134   HDL 52 11/26/2017 1134   CHOLHDL 3.5 10/22/2016 1457   CHOLHDL 3.6 12/08/2015 1037   VLDL 33 (H) 12/08/2015 1037   LDLCALC 81 11/26/2017 1134   Hepatic Function Panel     Component Value Date/Time   PROT 7.0 03/18/2018 1250   ALBUMIN 4.5 03/18/2018 1250   AST 22 03/18/2018 1250   ALT 32 03/18/2018 1250   ALKPHOS 110 03/18/2018 1250   BILITOT 0.4 03/18/2018 1250      Component Value Date/Time   TSH 1.800 03/17/2017 1220   TSH 2.220 10/22/2016 1457   TSH 1.75  12/08/2015 1037   Results for SEIDY, LABRECK (MRN [id number]) as of 05/04/2018 12:35  Ref. Range 03/18/2018 12:50  Vitamin D, 25-Hydroxy Latest Ref Range: 30.0 - 100.0 ng/mL 51.6   ASSESSMENT AND PLAN: Prediabetes  Other depression - with emotional eating - Plan: buPROPion (WELLBUTRIN SR) 200 MG 12 hr tablet  Class 1 obesity with serious comorbidity and body mass index (BMI) of 30.0 to 30.9 in adult, unspecified obesity type - beginning BMI >30  Type 2 diabetes mellitus without complication, without long-term current use of insulin ([place]) - Plan: metFORMIN (GLUCOPHAGE) 500 MG tablet  PLAN:  Pre-Diabetes Shadasia will continue to work on weight loss, exercise, and decreasing simple carbohydrates in her diet to help decrease the risk of diabetes. She was informed that eating too many simple carbohydrates or too many calories at one sitting increases the likelihood of GI side effects. Beverlie agrees to get back to her diet and to continue metformin 572m PO BID #60 with no refills and a prescription was written today. DJaymarieagreed to follow up with [place] to monitor her progress in 2 weeks.  Depression with Emotional Eating Behaviors We discussed behavior modification techniques today to help DAlexaraedeal with her emotional eating and depression. She has agreed to increase Wellbutrin SR to [id number] #30 with no refills and agreed to follow up as directed in 2 weeks.  Obesity DeCaitlins currently in the action stage of change. As such, her goal is to continue with weight loss efforts. She has agreed to keep a food journal with 1100 to 1300 calories and 80 grams of protein. DeDeavionas been instructed to work up to a goal of 150  minutes of combined cardio and strengthening exercise per week for weight loss and overall health benefits. We discussed the following Behavioral Modification Strategies today: work on meal planning and easy cooking plans, keeping healthy foods in the home,  emotional eating strategies, and planning for success.  Audie has agreed to follow up with our clinic in 2 weeks. She was informed of the importance of frequent follow up visits to maximize her success with intensive lifestyle modifications for her multiple health conditions.   OBESITY BEHAVIORAL INTERVENTION VISIT  Today's visit was # 23  Starting weight: 173 lbs Starting date: 03/17/17 Today's weight : Weight: 157 lb (71.2 kg)  Today's date: 04/29/2018 Total lbs lost to date: 16 At least 15 minutes were spent on discussing the following behavioral intervention visit.  ASK: We discussed the diagnosis of obesity with [name] today and Annalea agreed to give [place] permission to discuss obesity behavioral modification therapy today.  ASSESS: Leighanna has the diagnosis of obesity and her BMI today is 29.68. Idabell is in the action stage of change.   ADVISE: Killian was educated on the multiple health risks of obesity as well as the benefit of weight loss to improve her health. She was advised of the need for long term treatment and the importance of lifestyle modifications to improve her current health and to decrease her risk of future health problems.  AGREE: Multiple dietary modification options and treatment options were discussed and Genecis agreed to follow the recommendations documented in the above note.  ARRANGE: Shiniqua was educated on the importance of frequent visits to treat obesity as outlined per CMS and USPSTF guidelines and agreed to schedule her next follow up appointment today.  I, Marcille Blanco, am acting as transcriptionist for Starlyn Skeans, MD  I have reviewed the above documentation for accuracy and completeness, and I agree with the above. -Dennard Nip, MD

## 2018-05-05 ENCOUNTER — Encounter (INDEPENDENT_AMBULATORY_CARE_PROVIDER_SITE_OTHER): Payer: Self-pay | Admitting: Family Medicine

## 2018-05-08 ENCOUNTER — Other Ambulatory Visit (INDEPENDENT_AMBULATORY_CARE_PROVIDER_SITE_OTHER): Payer: Self-pay | Admitting: Family Medicine

## 2018-05-08 DIAGNOSIS — F3289 Other specified depressive episodes: Secondary | ICD-10-CM

## 2018-05-12 ENCOUNTER — Ambulatory Visit (INDEPENDENT_AMBULATORY_CARE_PROVIDER_SITE_OTHER): Payer: Medicare Other | Admitting: Family Medicine

## 2018-05-12 VITALS — BP 111/72 | HR 76 | Temp 98.1°F | Ht 61.0 in | Wt 154.0 lb

## 2018-05-12 DIAGNOSIS — E119 Type 2 diabetes mellitus without complications: Secondary | ICD-10-CM | POA: Diagnosis not present

## 2018-05-12 DIAGNOSIS — Z6830 Body mass index (BMI) 30.0-30.9, adult: Secondary | ICD-10-CM | POA: Diagnosis not present

## 2018-05-12 DIAGNOSIS — E669 Obesity, unspecified: Secondary | ICD-10-CM | POA: Diagnosis not present

## 2018-05-12 MED ORDER — GLUCOSE BLOOD VI STRP: ORAL_STRIP | 0 refills | Status: AC

## 2018-05-12 NOTE — Progress Notes (Signed)
Office: [phone]  /  Fax: [phone]   HPI:   Chief Complaint: OBESITY Carly Walker is here to discuss her progress with her obesity treatment plan. She is on the keep a food journal with 1100-1300 calories and 80 grams of protein daily and is following her eating plan approximately 70 % of the time. She states she is walking, moving, and packing for 30 minutes 5 times per week. Carly Walker continues to do well with weight loss. She is journaling frequently and she is still working on meeting her protein goal, as she doesn't like to eat meat.  Her weight is 154 lb (69.9 kg) today and has had a weight loss of 3 pounds over a period of 2 weeks since her last visit. She has lost 19 lbs since starting treatment with [place].  Diabetes II Carly Walker has a diagnosis of diabetes type II. Carly Walker states fasting BGs range mostly between 100 and 120. She is on metformin and denies nausea, vomiting, or hypoglycemia. Last A1c was 5.9. She has been working on intensive lifestyle modifications including diet, exercise, and weight loss to help control her blood glucose levels.  ALLERGIES: Allergies  Allergen Reactions  . Ace Inhibitors Cough  . Codeine Nausea Only  . Hydrocodone   . Sulfa Antibiotics Hives    MEDICATIONS: Current Outpatient Medications on File Prior to Visit  Medication Sig Dispense Refill  . acyclovir (ZOVIRAX) 400 MG tablet Take 400 mg by mouth daily. Ophthalmic HSV 1     . amLODipine (NORVASC) 10 MG tablet Take 1 tablet daily 90 tablet 3  . aspirin 81 MG tablet Take 81 mg by mouth daily.    Marland Kitchen atorvastatin (LIPITOR) 20 MG tablet Take 1 tablet (20 mg total) by mouth daily. 90 tablet 3  . Blood Glucose Monitoring Suppl (ONETOUCH VERIO) w/Device KIT 1 kit by Does not apply route 2 (two) times daily. 1 kit 0  . buPROPion (WELLBUTRIN SR) 200 MG 12 hr tablet Take 1 tablet (200 mg total) by mouth daily. 30 tablet 0  . Calcium Carb-Cholecalciferol (CALCIUM 600+D3) 600-800 MG-UNIT TABS Take 2  tablets by mouth daily.    . cetirizine (ZYRTEC) 10 MG tablet Take 10 mg by mouth daily.    . hydrochlorothiazide (MICROZIDE) 12.5 MG capsule TAKE 1 CAPSULE BY MOUTH EVERY DAY 90 capsule 3  . ketotifen (ZADITOR) 0.025 % ophthalmic solution 1 drop 2 (two) times daily.     . metFORMIN (GLUCOPHAGE) 500 MG tablet TAKE 1 TABLET BY MOUTH 2 TIMES DAILY WITH A MEAL. 60 tablet 0  . Multiple Vitamin (MULTIVITAMIN) tablet Take 1 tablet by mouth daily. Over the counter, for over 50 individuals( called 50+)     . omeprazole (PRILOSEC OTC) 20 MG tablet Take 20 mg by mouth daily.    [name] DELICA LANCETS 23N MISC 1 Bottle by Does not apply route 2 (two) times daily. 60 each 0  . Vitamin D, Ergocalciferol, (DRISDOL) 50000 units CAPS capsule Take 50,000 Units by mouth every 30 (thirty) days.     No current facility-administered medications on file prior to visit.     PAST MEDICAL HISTORY: Past Medical History:  Diagnosis Date  . Allergic rhinitis, cause unspecified   . Allergy    SEASONAL  . Arthritis    LOWER BACK  . Back pain   . Cancer [facility])    ENDOMETRIAL CANCER  . Cancer ([place])   . Chest pain   . Colon polyps 01/23/2006   type unknown   .  Constipation   . Depression   . Essential hypertension, benign   . Fatty liver   . GERD (gastroesophageal reflux disease)   . History of endometrial cancer 07/2009  . HTN (hypertension)   . Hypercholesteremia   . IBS (irritable bowel syndrome)   . Joint pain   . Leg edema   . Microscopic hematuria   . Migraine   . Mixed hyperlipidemia   . Ophthalmic herpes simplex   . Palpitations   . Proteinuria   . Tremor, essential   . Vitamin D deficiency     PAST SURGICAL HISTORY: Past Surgical History:  Procedure Laterality Date  . ABDOMINAL HYSTERECTOMY    . APPENDECTOMY    . CESAREAN SECTION    . CYST REMOVED FROM R WRIST    . HERNIA REPAIR  03/2010  . REPEAT CESAREAN SECTION    . TUBAL LIGATION    . WRIST FRACTURE SURGERY Left 07/31/2015     SOCIAL HISTORY: Social History   Tobacco Use  . Smoking status: Never Smoker  . Smokeless tobacco: Never Used  Substance Use Topics  . Alcohol use: No    Alcohol/week: 0.0 standard drinks  . Drug use: No    FAMILY HISTORY: Family History  Problem Relation Age of Onset  . Breast cancer Mother   . Emphysema Father   . Diabetes Father   . Heart disease Father   . Alcoholism Father   . Heart disease Paternal Grandmother   . Diabetes Brother   . Heart disease Brother     ROS: Review of Systems  Constitutional: Positive for weight loss.  Gastrointestinal: Negative for nausea and vomiting.  Endo/Heme/Allergies:       Negative hypoglycemia    PHYSICAL EXAM: Blood pressure 111/72, pulse 76, temperature 98.1 F (36.7 C), temperature source Oral, height '5\' 1"'  (1.549 m), weight 154 lb (69.9 kg), SpO2 95 %. Body mass index is 29.1 kg/m. Physical Exam  Constitutional: She is oriented to person, place, and time. She appears well-developed and well-nourished.  Cardiovascular: Normal rate.  Pulmonary/Chest: Effort normal.  Musculoskeletal: Normal range of motion.  Neurological: She is oriented to person, place, and time.  Skin: Skin is warm and dry.  Psychiatric: She has a normal mood and affect. Her behavior is normal.  Vitals reviewed.   RECENT LABS AND TESTS: BMET    Component Value Date/Time   NA 142 03/18/2018 1250   K 3.9 03/18/2018 1250   CL 101 03/18/2018 1250   CO2 25 03/18/2018 1250   GLUCOSE 86 03/18/2018 1250   GLUCOSE 102 (H) 12/08/2015 1037   BUN 23 03/18/2018 1250   CREATININE 0.78 03/18/2018 1250   CREATININE 0.82 12/08/2015 1037   CALCIUM 9.4 03/18/2018 1250   GFRNONAA 79 03/18/2018 1250   GFRNONAA 76 12/08/2015 1037   GFRAA 92 03/18/2018 1250   GFRAA 88 12/08/2015 1037   Lab Results  Component Value Date   HGBA1C 5.9 (H) 03/18/2018   HGBA1C 5.8 (H) 11/26/2017   HGBA1C 5.8 (H) 08/21/2017   HGBA1C 7.1 (H) 03/17/2017   HGBA1C 6.3 (H)  12/08/2015   Lab Results  Component Value Date   INSULIN 19.3 03/18/2018   INSULIN 13.3 11/26/2017   INSULIN 11.7 08/21/2017   INSULIN 30.9 (H) 03/17/2017   CBC    Component Value Date/Time   WBC 6.2 03/17/2017 1220   WBC 7.1 12/08/2015 1058   WBC 5.1 09/15/2014 0942   RBC 5.11 03/17/2017 1220   RBC 5.11 12/08/2015 1058  RBC 4.90 09/15/2014 0942   HGB 14.7 03/17/2017 1220   HCT 44.0 03/17/2017 1220   PLT 297 10/22/2016 1457   MCV 86 03/17/2017 1220   MCH 28.8 03/17/2017 1220   MCH 31.1 12/08/2015 1058   MCH 30.4 09/15/2014 0942   MCHC 33.4 03/17/2017 1220   MCHC 35.9 (A) 12/08/2015 1058   MCHC 34.5 09/15/2014 0942   RDW 14.6 03/17/2017 1220   LYMPHSABS 2.6 03/17/2017 1220   MONOABS 0.4 09/15/2014 0942   EOSABS 0.2 03/17/2017 1220   BASOSABS 0.0 03/17/2017 1220   Iron/TIBC/Ferritin/ %Sat    Component Value Date/Time   IRON 97 12/18/2015 1218   TIBC 394 12/18/2015 1218   FERRITIN 31 12/18/2015 1218   IRONPCTSAT 25 12/18/2015 1218   Lipid Panel     Component Value Date/Time   CHOL 154 11/26/2017 1134   TRIG 106 11/26/2017 1134   HDL 52 11/26/2017 1134   CHOLHDL 3.5 10/22/2016 1457   CHOLHDL 3.6 12/08/2015 1037   VLDL 33 (H) 12/08/2015 1037   LDLCALC 81 11/26/2017 1134   Hepatic Function Panel     Component Value Date/Time   PROT 7.0 03/18/2018 1250   ALBUMIN 4.5 03/18/2018 1250   AST 22 03/18/2018 1250   ALT 32 03/18/2018 1250   ALKPHOS 110 03/18/2018 1250   BILITOT 0.4 03/18/2018 1250      Component Value Date/Time   TSH 1.800 03/17/2017 1220   TSH 2.220 10/22/2016 1457   TSH 1.75 12/08/2015 1037    ASSESSMENT AND PLAN: Type 2 diabetes mellitus without complication, without long-term current use of insulin ([place]) - Plan: glucose blood (ONETOUCH VERIO) test strip  Class 1 obesity with serious comorbidity and body mass index (BMI) of 30.0 to 30.9 in adult, unspecified obesity type - Starting BMI greater then 30  PLAN:  Diabetes II Monti has  been given extensive diabetes education by myself today including ideal fasting and post-prandial blood glucose readings, individual ideal Hgb A1c goals and hypoglycemia prevention. We discussed the importance of good blood sugar control to decrease the likelihood of diabetic complications such as nephropathy, neuropathy, limb loss, blindness, coronary artery disease, and death. We discussed the importance of intensive lifestyle modification including diet, exercise and weight loss as the first line treatment for diabetes. Carly Walker agrees to continue her diabetes medications, diet, and exercise, and we will refill glucose test strips with no refills. Carly Walker agrees to follow up with our clinic in 2 to 3 weeks.  Obesity Carly Walker is currently in the action stage of change. As such, her goal is to continue with weight loss efforts She has agreed to keep a food journal with 1100-1300 calories and 80+ grams of protein daily Carly Walker has been instructed to work up to a goal of 150 minutes of combined cardio and strengthening exercise per week for weight loss and overall health benefits. We discussed the following Behavioral Modification Strategies today: increasing lean protein intake and work on meal planning and easy cooking plans High protein vegetarian options were discussed and handout was given.  Carly Walker has agreed to follow up with our clinic in 2 to 3 weeks. She was informed of the importance of frequent follow up visits to maximize her success with intensive lifestyle modifications for her multiple health conditions.   OBESITY BEHAVIORAL INTERVENTION VISIT  Today's visit was # 24   Starting weight: 173 lbs Starting date: 03/17/17 Today's weight : 154 lbs  Today's date: 05/12/2018 Total lbs lost to date: 19 At least  15 minutes were spent on discussing the following behavioral intervention visit.   ASK: We discussed the diagnosis of obesity with Carly Walker today and Carly Walker agreed to give  [place] permission to discuss obesity behavioral modification therapy today.  ASSESS: Carly Walker has the diagnosis of obesity and her BMI today is 29.11 Carly Walker is in the action stage of change   ADVISE: Carly Walker was educated on the multiple health risks of obesity as well as the benefit of weight loss to improve her health. She was advised of the need for long term treatment and the importance of lifestyle modifications to improve her current health and to decrease her risk of future health problems.  AGREE: Multiple dietary modification options and treatment options were discussed and  Carly Walker agreed to follow the recommendations documented in the above note.  ARRANGE: Cassiopeia was educated on the importance of frequent visits to treat obesity as outlined per CMS and USPSTF guidelines and agreed to schedule her next follow up appointment today.  I, Trixie Dredge, am acting as transcriptionist for Dennard Nip, MD  I have reviewed the above documentation for accuracy and completeness, and I agree with the above. -Dennard Nip, MD

## 2018-05-13 ENCOUNTER — Encounter (INDEPENDENT_AMBULATORY_CARE_PROVIDER_SITE_OTHER): Payer: Self-pay | Admitting: Family Medicine

## 2018-05-27 ENCOUNTER — Ambulatory Visit (INDEPENDENT_AMBULATORY_CARE_PROVIDER_SITE_OTHER): Payer: Medicare Other | Admitting: Family Medicine

## 2018-05-27 VITALS — BP 136/80 | HR 80 | Temp 97.6°F | Ht 61.0 in | Wt 156.0 lb

## 2018-05-27 DIAGNOSIS — M8580 Other specified disorders of bone density and structure, unspecified site: Secondary | ICD-10-CM

## 2018-05-27 DIAGNOSIS — E119 Type 2 diabetes mellitus without complications: Secondary | ICD-10-CM | POA: Diagnosis not present

## 2018-05-27 DIAGNOSIS — Z6830 Body mass index (BMI) 30.0-30.9, adult: Secondary | ICD-10-CM | POA: Diagnosis not present

## 2018-05-27 DIAGNOSIS — Z9189 Other specified personal risk factors, not elsewhere classified: Secondary | ICD-10-CM | POA: Diagnosis not present

## 2018-05-27 DIAGNOSIS — F3289 Other specified depressive episodes: Secondary | ICD-10-CM

## 2018-05-27 DIAGNOSIS — E669 Obesity, unspecified: Secondary | ICD-10-CM

## 2018-05-27 MED ORDER — METFORMIN HCL 500 MG PO TABS: ORAL_TABLET | ORAL | 0 refills | Status: DC

## 2018-05-27 MED ORDER — BUPROPION HCL ER (SR) 200 MG PO TB12: 200.0000 mg | ORAL_TABLET | Freq: Every day | ORAL | 0 refills | Status: DC

## 2018-06-02 ENCOUNTER — Encounter (INDEPENDENT_AMBULATORY_CARE_PROVIDER_SITE_OTHER): Payer: Self-pay | Admitting: Family Medicine

## 2018-06-02 NOTE — Progress Notes (Signed)
Office: [phone]  /  Fax: [phone]   HPI:   Chief Complaint: OBESITY Carly Walker is here to discuss her progress with her obesity treatment plan. She is keeping a food journal with 1100 to 1300 calories and 80 grams of protein and is following her eating plan approximately 75 % of the time. She states she is walking 10 minutes 5 times per week. Krisi has been eating more protein and is cognizant of her protein intake. She is mostly vegetarian, but eats fish and dairy.  Her weight is 156 lb (70.8 kg) today and has had a weight gain of 2 pounds over a period of 2 weeks since her last visit. She has lost 17 lbs since starting treatment with [place].  Depression with emotional eating behaviors Carly Walker is struggling with emotional eating and using food for comfort to the extent that it is negatively impacting her health. She often snacks when she is not hungry. Carly Walker sometimes feels she is out of control and then feels guilty that she made poor food choices. She has been working on behavior modification techniques to help reduce her emotional eating and has been somewhat successful. She is taking bupropion that is helping with emotional eating, which has decreased. She had increased tremors in her hands from the increased dose of bupropion, but these are tolerable. She shows no sign of suicidal or homicidal ideations.  Diabetes II Carly Walker has a diagnosis of diabetes type II. She is on metformin. Carly Walker states that her fasting BG's range between 90 and 110 and her 2 hour post prandial sugars range from 90 to 110. Last A1c was 5.9 on 03/18/18 and is well controlled. She denies nausea, vomiting, diarrhea, or hypoglycemia. She has been working on intensive lifestyle modifications including diet, exercise, and weight loss to help control her blood glucose levels.  Osteopenia Carly Walker has a diagnosis of osteopenia and her vitamin D is at goal.  At risk for osteopenia and osteoporosis Carly Walker is at  higher risk of osteopenia and osteoporosis due to vitamin D deficiency.   ALLERGIES: Allergies  Allergen Reactions  . Ace Inhibitors Cough  . Codeine Nausea Only  . Hydrocodone   . Sulfa Antibiotics Hives    MEDICATIONS: Current Outpatient Medications on File Prior to Visit  Medication Sig Dispense Refill  . acyclovir (ZOVIRAX) 400 MG tablet Take 400 mg by mouth daily. Ophthalmic HSV 1     . amLODipine (NORVASC) 10 MG tablet Take 1 tablet daily 90 tablet 3  . aspirin 81 MG tablet Take 81 mg by mouth daily.    Marland Kitchen atorvastatin (LIPITOR) 20 MG tablet Take 1 tablet (20 mg total) by mouth daily. 90 tablet 3  . Blood Glucose Monitoring Suppl (ONETOUCH VERIO) w/Device KIT 1 kit by Does not apply route 2 (two) times daily. 1 kit 0  . Calcium Carb-Cholecalciferol (CALCIUM 600+D3) 600-800 MG-UNIT TABS Take 2 tablets by mouth daily.    . cetirizine (ZYRTEC) 10 MG tablet Take 10 mg by mouth daily.    Marland Kitchen glucose blood (ONETOUCH VERIO) test strip TEST BLOOD SUGARS TWICE DAILY 200 each 0  . hydrochlorothiazide (MICROZIDE) 12.5 MG capsule TAKE 1 CAPSULE BY MOUTH EVERY DAY 90 capsule 3  . ketotifen (ZADITOR) 0.025 % ophthalmic solution 1 drop 2 (two) times daily.     . Multiple Vitamin (MULTIVITAMIN) tablet Take 1 tablet by mouth daily. Over the counter, for over 50 individuals( called 50+)     . omeprazole (PRILOSEC OTC) 20 MG tablet Take 20  mg by mouth daily.    [name] DELICA LANCETS 76B MISC 1 Bottle by Does not apply route 2 (two) times daily. 60 each 0  . Vitamin D, Ergocalciferol, (DRISDOL) 50000 units CAPS capsule Take 50,000 Units by mouth every 30 (thirty) days.     No current facility-administered medications on file prior to visit.     PAST MEDICAL HISTORY: Past Medical History:  Diagnosis Date  . Allergic rhinitis, cause unspecified   . Allergy    SEASONAL  . Arthritis    LOWER BACK  . Back pain   . [facility]    ENDOMETRIAL CANCER  . Cancer ([place])   . Chest pain   . Colon  polyps 01/23/2006   type unknown   . Constipation   . Depression   . Essential hypertension, benign   . Fatty liver   . GERD (gastroesophageal reflux disease)   . History of endometrial cancer 07/2009  . HTN (hypertension)   . Hypercholesteremia   . IBS (irritable bowel syndrome)   . Joint pain   . Leg edema   . Microscopic hematuria   . Migraine   . Mixed hyperlipidemia   . Ophthalmic herpes simplex   . Palpitations   . Proteinuria   . Tremor, essential   . Vitamin D deficiency     PAST SURGICAL HISTORY: Past Surgical History:  Procedure Laterality Date  . ABDOMINAL HYSTERECTOMY    . APPENDECTOMY    . CESAREAN SECTION    . CYST REMOVED FROM R WRIST    . HERNIA REPAIR  03/2010  . REPEAT CESAREAN SECTION    . TUBAL LIGATION    . WRIST FRACTURE SURGERY Left 07/31/2015    SOCIAL HISTORY: Social History   Tobacco Use  . Smoking status: Never Smoker  . Smokeless tobacco: Never Used  Substance Use Topics  . Alcohol use: No    Alcohol/week: 0.0 standard drinks  . Drug use: No    FAMILY HISTORY: Family History  Problem Relation Age of Onset  . Breast cancer Mother   . Emphysema Father   . Diabetes Father   . Heart disease Father   . Alcoholism Father   . Heart disease Paternal Grandmother   . Diabetes Brother   . Heart disease Brother     ROS: Review of Systems  Constitutional: Negative for weight loss.  Gastrointestinal: Negative for diarrhea, nausea and vomiting.  Endo/Heme/Allergies:       Negative for hypoglycemia.  Psychiatric/Behavioral: Positive for depression.    PHYSICAL EXAM: Blood pressure 136/80, pulse 80, temperature 97.6 F (36.4 C), temperature source Oral, height 5' 1" (1.549 m), weight 156 lb (70.8 kg), SpO2 96 %. Body mass index is 29.48 kg/m. Physical Exam  Constitutional: She is oriented to person, place, and time. She appears well-developed and well-nourished.  Cardiovascular: Normal rate.  Pulmonary/Chest: Effort normal.    Musculoskeletal: Normal range of motion.  Neurological: She is oriented to person, place, and time.  Skin: Skin is warm and dry.  Psychiatric: She has a normal mood and affect. Her behavior is normal.  Vitals reviewed.   RECENT LABS AND TESTS: BMET    Component Value Date/Time   NA 142 03/18/2018 1250   K 3.9 03/18/2018 1250   CL 101 03/18/2018 1250   CO2 25 03/18/2018 1250   GLUCOSE 86 03/18/2018 1250   GLUCOSE 102 (H) 12/08/2015 1037   BUN 23 03/18/2018 1250   CREATININE 0.78 03/18/2018 1250   CREATININE 0.82  12/08/2015 1037   CALCIUM 9.4 03/18/2018 1250   GFRNONAA 79 03/18/2018 1250   GFRNONAA 76 12/08/2015 1037   GFRAA 92 03/18/2018 1250   GFRAA 88 12/08/2015 1037   Lab Results  Component Value Date   HGBA1C 5.9 (H) 03/18/2018   HGBA1C 5.8 (H) 11/26/2017   HGBA1C 5.8 (H) 08/21/2017   HGBA1C 7.1 (H) 03/17/2017   HGBA1C 6.3 (H) 12/08/2015   Lab Results  Component Value Date   INSULIN 19.3 03/18/2018   INSULIN 13.3 11/26/2017   INSULIN 11.7 08/21/2017   INSULIN 30.9 (H) 03/17/2017   CBC    Component Value Date/Time   WBC 6.2 03/17/2017 1220   WBC 7.1 12/08/2015 1058   WBC 5.1 09/15/2014 0942   RBC 5.11 03/17/2017 1220   RBC 5.11 12/08/2015 1058   RBC 4.90 09/15/2014 0942   HGB 14.7 03/17/2017 1220   HCT 44.0 03/17/2017 1220   PLT 297 10/22/2016 1457   MCV 86 03/17/2017 1220   MCH 28.8 03/17/2017 1220   MCH 31.1 12/08/2015 1058   MCH 30.4 09/15/2014 0942   MCHC 33.4 03/17/2017 1220   MCHC 35.9 (A) 12/08/2015 1058   MCHC 34.5 09/15/2014 0942   RDW 14.6 03/17/2017 1220   LYMPHSABS 2.6 03/17/2017 1220   MONOABS 0.4 09/15/2014 0942   EOSABS 0.2 03/17/2017 1220   BASOSABS 0.0 03/17/2017 1220   Iron/TIBC/Ferritin/ %Sat    Component Value Date/Time   IRON 97 12/18/2015 1218   TIBC 394 12/18/2015 1218   FERRITIN 31 12/18/2015 1218   IRONPCTSAT 25 12/18/2015 1218   Lipid Panel     Component Value Date/Time   CHOL 154 11/26/2017 1134   TRIG 106  11/26/2017 1134   HDL 52 11/26/2017 1134   CHOLHDL 3.5 10/22/2016 1457   CHOLHDL 3.6 12/08/2015 1037   VLDL 33 (H) 12/08/2015 1037   LDLCALC 81 11/26/2017 1134   Hepatic Function Panel     Component Value Date/Time   PROT 7.0 03/18/2018 1250   ALBUMIN 4.5 03/18/2018 1250   AST 22 03/18/2018 1250   ALT 32 03/18/2018 1250   ALKPHOS 110 03/18/2018 1250   BILITOT 0.4 03/18/2018 1250      Component Value Date/Time   TSH 1.800 03/17/2017 1220   TSH 2.220 10/22/2016 1457   TSH 1.75 12/08/2015 1037   Results for TIAJUANA, LEPPANEN (MRN [id number]) as of 06/02/2018 10:14  Ref. Range 03/18/2018 12:50  Vitamin D, 25-Hydroxy Latest Ref Range: 30.0 - 100.0 ng/mL 51.6   ASSESSMENT AND PLAN: Type 2 diabetes mellitus without complication, without long-term current use of insulin ([facility]) - Plan: metFORMIN (GLUCOPHAGE) 500 MG tablet  Other depression - with emotional eating - Plan: buPROPion (WELLBUTRIN SR) 200 MG 12 hr tablet  Osteopenia, unspecified location  At risk for osteoporosis  Class 1 obesity with serious comorbidity and body mass index (BMI) of 30.0 to 30.9 in adult, unspecified obesity type - BMI greater than 30 at start of program  PLAN:  Diabetes II Carly Walker has been given extensive diabetes education by myself today including ideal fasting and post-prandial blood glucose readings, individual ideal Hgb A1c goals, and hypoglycemia prevention. We discussed the importance of good blood sugar control to decrease the likelihood of diabetic complications such as nephropathy, neuropathy, limb loss, blindness, coronary artery disease, and death. We discussed the importance of intensive lifestyle modification including diet, exercise and weight loss as the first line treatment for diabetes. Carly Walker agrees to continue her metformin 563m BID with meals #60  with no refills and will follow up at the agreed upon time in 2 to 3 weeks.  Osteopenia Carly Walker agrees to continue to take prescription Vit  D _0 ,000 IU monthly and we will recheck her vitamin D level in 1 month. She agrees to follow up as directed.  At risk for osteopenia and osteoporosis Carly Walker was given extended (15 minutes) osteoporosis prevention counseling today. Carly Walker is at risk for osteopenia and osteoporosis due to her vitamin D deficiency. She was encouraged to take her vitamin D and follow her higher calcium diet and increase strengthening exercise to help strengthen her bones and decrease her risk of osteopenia and osteoporosis.  Depression with Emotional Eating Behaviors We discussed behavior modification techniques today to help Carly Walker deal with her emotional eating and depression. She has agreed to continue to take bupropion SR 247m qd #30 with no refills and she agreed to follow up as directed.  Obesity Carly Walker currently in the action stage of change. As such, her goal is to continue with weight loss efforts. She has agreed to keep a food journal with 1100 to 1300 calories and 80+ grams of protein. She was given the Thanksgiving handout. DJennayahas been instructed to work up to a goal of 150 minutes of combined cardio and strengthening exercise per week for weight loss and overall health benefits. We discussed the following Behavioral Modification Strategies today: increasing lean protein intake and holiday eating strategies.   DLilyahas agreed to follow up with our clinic in 2 to 3 weeks. She was informed of the importance of frequent follow up visits to maximize her success with intensive lifestyle modifications for her multiple health conditions.   OBESITY BEHAVIORAL INTERVENTION VISIT  Today's visit was # 25  Starting weight: 173 lbs Starting date: 03/17/17 Today's weight : Weight: 156 lb (70.8 kg)  Today's date: 05/27/2018 Total lbs lost to date: 160 ASK: We discussed the diagnosis of obesity with DPilar Grammestoday and DLovelleagreed to give u[place]mission to discuss obesity behavioral  modification therapy today.  ASSESS: DArianihas the diagnosis of obesity and her BMI today is 29.49. DMurryis in the action stage of change.   ADVISE: DKarissawas educated on the multiple health risks of obesity as well as the benefit of weight loss to improve her health. She was advised of the need for long term treatment and the importance of lifestyle modifications to improve her current health and to decrease her risk of future health problems.  AGREE: Multiple dietary modification options and treatment options were discussed and DTeyannaagreed to follow the recommendations documented in the above note.  ARRANGE: Carly Walker educated on the importance of frequent visits to treat obesity as outlined per CMS and USPSTF guidelines and agreed to schedule her next follow up appointment today.  ILenward Walker am acting as tLocation managerfor DGeorgianne Fick FNP.  I have reviewed the above documentation for accuracy and completeness, and I agree with the above.  - Silvia Markuson, FNP-C.

## 2018-06-17 ENCOUNTER — Ambulatory Visit (INDEPENDENT_AMBULATORY_CARE_PROVIDER_SITE_OTHER): Payer: Medicare Other | Admitting: Family Medicine

## 2018-06-17 ENCOUNTER — Encounter (INDEPENDENT_AMBULATORY_CARE_PROVIDER_SITE_OTHER): Payer: Self-pay | Admitting: Family Medicine

## 2018-06-17 VITALS — BP 152/84 | HR 61 | Temp 98.1°F | Ht 61.0 in | Wt 154.0 lb

## 2018-06-17 DIAGNOSIS — F32A Depression, unspecified: Secondary | ICD-10-CM | POA: Insufficient documentation

## 2018-06-17 DIAGNOSIS — F3289 Other specified depressive episodes: Secondary | ICD-10-CM

## 2018-06-17 DIAGNOSIS — E669 Obesity, unspecified: Secondary | ICD-10-CM | POA: Diagnosis not present

## 2018-06-17 DIAGNOSIS — E119 Type 2 diabetes mellitus without complications: Secondary | ICD-10-CM | POA: Diagnosis not present

## 2018-06-17 DIAGNOSIS — E7849 Other hyperlipidemia: Secondary | ICD-10-CM

## 2018-06-17 DIAGNOSIS — Z6830 Body mass index (BMI) 30.0-30.9, adult: Secondary | ICD-10-CM | POA: Diagnosis not present

## 2018-06-17 DIAGNOSIS — E559 Vitamin D deficiency, unspecified: Secondary | ICD-10-CM | POA: Diagnosis not present

## 2018-06-17 DIAGNOSIS — F329 Major depressive disorder, single episode, unspecified: Secondary | ICD-10-CM | POA: Insufficient documentation

## 2018-06-17 MED ORDER — METFORMIN HCL 500 MG PO TABS: ORAL_TABLET | ORAL | 0 refills | Status: DC

## 2018-06-17 MED ORDER — BUPROPION HCL ER (SR) 200 MG PO TB12: 200.0000 mg | ORAL_TABLET | Freq: Every day | ORAL | 0 refills | Status: DC

## 2018-06-17 MED ORDER — VITAMIN D (ERGOCALCIFEROL) 1.25 MG (50000 UNIT) PO CAPS: 50000.0000 [IU] | ORAL_CAPSULE | ORAL | 0 refills | Status: AC

## 2018-06-17 NOTE — Progress Notes (Signed)
Office: [phone]  /  Fax: [phone]   HPI:   Chief Complaint: OBESITY Carly Walker is here to discuss her progress with her obesity treatment plan. She is on the keep a food journal with 1100-1300 calories and 80+ grams of protein daily and is following her eating plan approximately 66% of the time. She states she is moving furniture and boxes for 30 minutes 3 times per week. Carly Walker is moving January 1st to [place]. She has been off track recently from packing for her move, but trying to get in protein and vegetables.  Her weight is 154 lb (69.9 kg) today and has had a weight loss of 2 pounds over a period of 3 weeks since her last visit. She has lost 19 lbs since starting treatment with [place].  Vitamin D Deficiency Carly Walker has a diagnosis of vitamin D deficiency. She is currently taking prescription Vit D. Last Vit D level was 51.6 on 03/18/18. She denies nausea, vomiting or muscle weakness.  Diabetes II Carly Walker has a diagnosis of diabetes type II. She is well-controlled with metformin.  Carly Walker states fasting BGs range between 90's and 105 and she denies hypoglycemia. Last A1c was 5.9. She denies nausea, vomiting, or diarrhea. She has been working on intensive lifestyle modifications including diet, exercise, and weight loss to help control her blood glucose levels.  Hyperlipidemia Carly Walker has hyperlipidemia and has been trying to improve her cholesterol levels with intensive lifestyle modification including a low saturated fat diet, exercise and weight loss. She is on atorvastatin 20 mg and denies any chest pain, shortness of breath, or myalgias.  Depression with emotional eating behaviors Carly Walker is stable on bupropion and notes it is helping with cravings. Carly Walker struggles with emotional eating and using food for comfort to the extent that it is negatively impacting her health. She often snacks when she is not hungry. Carly Walker sometimes feels she is out of control and then  feels guilty that she made poor food choices. She has been working on behavior modification techniques to help reduce her emotional eating and has been somewhat successful. She shows no sign of suicidal or homicidal ideations.  Depression screen Carly Walker 2/9 11/27/2017 03/17/2017 11/19/2016 11/15/2016 10/22/2016  Decreased Interest 0 1 0 0 0  Down, Depressed, Hopeless 0 1 0 0 0  PHQ - 2 Score 0 2 0 0 0  Altered sleeping - 1 - - -  Tired, decreased energy - 2 - - -  Change in appetite - 3 - - -  Feeling bad or failure about yourself  - 1 - - -  Trouble concentrating - 1 - - -  Moving slowly or fidgety/restless - 0 - - -  Suicidal thoughts - 0 - - -  PHQ-9 Score - 10 - - -  Difficult doing work/chores - Somewhat difficult - - -    ALLERGIES: Allergies  Allergen Reactions  . Ace Inhibitors Cough  . Codeine Nausea Only  . Hydrocodone   . Sulfa Antibiotics Hives    MEDICATIONS: Current Outpatient Medications on File Prior to Visit  Medication Sig Dispense Refill  . acyclovir (ZOVIRAX) 400 MG tablet Take 400 mg by mouth daily. Ophthalmic HSV 1     . amLODipine (NORVASC) 10 MG tablet Take 1 tablet daily 90 tablet 3  . aspirin 81 MG tablet Take 81 mg by mouth daily.    Marland Kitchen atorvastatin (LIPITOR) 20 MG tablet Take 1 tablet (20 mg total) by mouth daily. 90 tablet 3  .  Blood Glucose Monitoring Suppl (ONETOUCH VERIO) w/Device KIT 1 kit by Does not apply route 2 (two) times daily. 1 kit 0  . Calcium Carb-Cholecalciferol (CALCIUM 600+D3) 600-800 MG-UNIT TABS Take 2 tablets by mouth daily.    . cetirizine (ZYRTEC) 10 MG tablet Take 10 mg by mouth daily.    Marland Kitchen glucose blood (ONETOUCH VERIO) test strip TEST BLOOD SUGARS TWICE DAILY 200 each 0  . hydrochlorothiazide (MICROZIDE) 12.5 MG capsule TAKE 1 CAPSULE BY MOUTH EVERY DAY 90 capsule 3  . ketotifen (ZADITOR) 0.025 % ophthalmic solution 1 drop 2 (two) times daily.     . Multiple Vitamin (MULTIVITAMIN) tablet Take 1 tablet by mouth daily. Over the counter,  for over 50 individuals( called 50+)     . omeprazole (PRILOSEC OTC) 20 MG tablet Take 20 mg by mouth daily.    Carly Walker DELICA LANCETS 38B MISC 1 Bottle by Does not apply route 2 (two) times daily. 60 each 0   No current facility-administered medications on file prior to visit.     PAST MEDICAL HISTORY: Past Medical History:  Diagnosis Date  . Allergic rhinitis, cause unspecified   . Allergy    SEASONAL  . Arthritis    LOWER BACK  . Back pain   . Cancer [facility])    ENDOMETRIAL CANCER  . Cancer ([place])   . Chest pain   . Colon polyps 01/23/2006   type unknown   . Constipation   . Depression   . Essential hypertension, benign   . Fatty liver   . GERD (gastroesophageal reflux disease)   . History of endometrial cancer 07/2009  . HTN (hypertension)   . Hypercholesteremia   . IBS (irritable bowel syndrome)   . Joint pain   . Leg edema   . Microscopic hematuria   . Migraine   . Mixed hyperlipidemia   . Ophthalmic herpes simplex   . Palpitations   . Proteinuria   . Tremor, essential   . Vitamin D deficiency     PAST SURGICAL HISTORY: Past Surgical History:  Procedure Laterality Date  . ABDOMINAL HYSTERECTOMY    . APPENDECTOMY    . CESAREAN SECTION    . CYST REMOVED FROM R WRIST    . HERNIA REPAIR  03/2010  . REPEAT CESAREAN SECTION    . TUBAL LIGATION    . WRIST FRACTURE SURGERY Left 07/31/2015    SOCIAL HISTORY: Social History   Tobacco Use  . Smoking status: Never Smoker  . Smokeless tobacco: Never Used  Substance Use Topics  . Alcohol use: No    Alcohol/week: 0.0 standard drinks  . Drug use: No    FAMILY HISTORY: Family History  Problem Relation Age of Onset  . Breast cancer Mother   . Emphysema Father   . Diabetes Father   . Heart disease Father   . Alcoholism Father   . Heart disease Paternal Grandmother   . Diabetes Brother   . Heart disease Brother     ROS: Review of Systems  Constitutional: Positive for weight loss.  Respiratory:  Negative for shortness of breath.   Cardiovascular: Negative for chest pain.  Gastrointestinal: Negative for diarrhea, nausea and vomiting.  Musculoskeletal: Negative for myalgias.       Negative muscle weakness  Endo/Heme/Allergies:       Negative hypoglycemia  Psychiatric/Behavioral: Positive for depression. Negative for suicidal ideas.    PHYSICAL EXAM: Blood pressure (!) 152/84, pulse 61, temperature 98.1 F (36.7 C), temperature source Oral, height  '5\' 1"'  (1.549 m), weight 154 lb (69.9 kg), SpO2 97 %. Body mass index is 29.1 kg/m. Physical Exam  Constitutional: She is oriented to person, place, and time. She appears well-developed and well-nourished.  Cardiovascular: Normal rate.  Pulmonary/Chest: Effort normal.  Musculoskeletal: Normal range of motion.  Neurological: She is oriented to person, place, and time.  Skin: Skin is warm and dry.  Psychiatric: She has a normal mood and affect. Her behavior is normal.  Vitals reviewed.   RECENT LABS AND TESTS: BMET    Component Value Date/Time   NA 142 03/18/2018 1250   K 3.9 03/18/2018 1250   CL 101 03/18/2018 1250   CO2 25 03/18/2018 1250   GLUCOSE 86 03/18/2018 1250   GLUCOSE 102 (H) 12/08/2015 1037   BUN 23 03/18/2018 1250   CREATININE 0.78 03/18/2018 1250   CREATININE 0.82 12/08/2015 1037   CALCIUM 9.4 03/18/2018 1250   GFRNONAA 79 03/18/2018 1250   GFRNONAA 76 12/08/2015 1037   GFRAA 92 03/18/2018 1250   GFRAA 88 12/08/2015 1037   Lab Results  Component Value Date   HGBA1C 5.9 (H) 03/18/2018   HGBA1C 5.8 (H) 11/26/2017   HGBA1C 5.8 (H) 08/21/2017   HGBA1C 7.1 (H) 03/17/2017   HGBA1C 6.3 (H) 12/08/2015   Lab Results  Component Value Date   INSULIN 19.3 03/18/2018   INSULIN 13.3 11/26/2017   INSULIN 11.7 08/21/2017   INSULIN 30.9 (H) 03/17/2017   CBC    Component Value Date/Time   WBC 6.2 03/17/2017 1220   WBC 7.1 12/08/2015 1058   WBC 5.1 09/15/2014 0942   RBC 5.11 03/17/2017 1220   RBC 5.11  12/08/2015 1058   RBC 4.90 09/15/2014 0942   HGB 14.7 03/17/2017 1220   HCT 44.0 03/17/2017 1220   PLT 297 10/22/2016 1457   MCV 86 03/17/2017 1220   MCH 28.8 03/17/2017 1220   MCH 31.1 12/08/2015 1058   MCH 30.4 09/15/2014 0942   MCHC 33.4 03/17/2017 1220   MCHC 35.9 (A) 12/08/2015 1058   MCHC 34.5 09/15/2014 0942   RDW 14.6 03/17/2017 1220   LYMPHSABS 2.6 03/17/2017 1220   MONOABS 0.4 09/15/2014 0942   EOSABS 0.2 03/17/2017 1220   BASOSABS 0.0 03/17/2017 1220   Iron/TIBC/Ferritin/ %Sat    Component Value Date/Time   IRON 97 12/18/2015 1218   TIBC 394 12/18/2015 1218   FERRITIN 31 12/18/2015 1218   IRONPCTSAT 25 12/18/2015 1218   Lipid Panel     Component Value Date/Time   CHOL 154 11/26/2017 1134   TRIG 106 11/26/2017 1134   HDL 52 11/26/2017 1134   CHOLHDL 3.5 10/22/2016 1457   CHOLHDL 3.6 12/08/2015 1037   VLDL 33 (H) 12/08/2015 1037   LDLCALC 81 11/26/2017 1134   Hepatic Function Panel     Component Value Date/Time   PROT 7.0 03/18/2018 1250   ALBUMIN 4.5 03/18/2018 1250   AST 22 03/18/2018 1250   ALT 32 03/18/2018 1250   ALKPHOS 110 03/18/2018 1250   BILITOT 0.4 03/18/2018 1250      Component Value Date/Time   TSH 1.800 03/17/2017 1220   TSH 2.220 10/22/2016 1457   TSH 1.75 12/08/2015 1037  Results for CHEN, HOLZMAN (MRN [id number]) as of 06/17/2018 09:31  Ref. Range 03/18/2018 12:50  Vitamin D, 25-Hydroxy Latest Ref Range: 30.0 - 100.0 ng/mL 51.6    ASSESSMENT AND PLAN: Vitamin D deficiency - Plan: VITAMIN D 25 Hydroxy (Vit-D Deficiency, Fractures)  Type 2 diabetes mellitus without complication,  without long-term current use of insulin ([place]) - Plan: Comprehensive metabolic panel, Hemoglobin A1c, metFORMIN (GLUCOPHAGE) 500 MG tablet  Other hyperlipidemia - Plan: Lipid Panel With LDL/HDL Ratio  Other depression - with emotional eating - Plan: buPROPion (WELLBUTRIN SR) 200 MG 12 hr tablet  Class 1 obesity with serious comorbidity and body mass  index (BMI) of 30.0 to 30.9 in adult, unspecified obesity type - Starting BMI greater then 30  PLAN:  Vitamin D Deficiency Analena was informed that low vitamin D levels contributes to fatigue and are associated with obesity, breast, and colon cancer. Margit agrees to continue taking prescription Vit D '@50' ,000 IU every week #12 with no refills. She requests 90 day refills because she will coming to the clinic less frequently after she moves.She will follow up for routine testing of vitamin D, at least 2-3 times per year. She was informed of the risk of over-replacement of vitamin D and agrees to not increase her dose unless she discusses this with [place] first. We will check Vit D level today and Ozie agrees to follow up with our clinic in 3 to 4 weeks.  Diabetes II Carly Walker has been given extensive diabetes education by myself today including ideal fasting and post-prandial blood glucose readings, individual ideal Hgb A1c goals and hypoglycemia prevention. We discussed the importance of good blood sugar control to decrease the likelihood of diabetic complications such as nephropathy, neuropathy, limb loss, blindness, coronary artery disease, and death. We discussed the importance of intensive lifestyle modification including diet, exercise and weight loss as the first line treatment for diabetes. Carly Walker agrees to continue taking metformin 500 mg BID #180 with no refills. We will check fasting glucose and A1c today. Carly Walker agrees to follow up with our clinic in 3 to 4 weeks.  Hyperlipidemia Carly Walker was informed of the American Heart Association Guidelines emphasizing intensive lifestyle modifications as the first line treatment for hyperlipidemia. We discussed many lifestyle modifications today in depth, and Carly Walker will continue to work on decreasing saturated fats such as fatty red meat, butter and many fried foods. She will also increase vegetables and lean protein in her diet and continue to work on  exercise and weight loss efforts. We will check FLP today and Carly Walker agrees to follow up with our clinic in 3 to 4 weeks.  Depression with Emotional Eating Behaviors We discussed behavior modification techniques today to help Carly Walker deal with her emotional eating and depression. Carly Walker agrees to continue taking Wellbutrin SR 200 mg q AM #90 with no refills. Carly Walker agrees to follow up with our clinic in 3 to 4 weeks.   Obesity Carly Walker is currently in the action stage of change. As such, her goal is to continue with weight loss efforts She has agreed to keep a food journal with 1100-1300 calories and 80+ grams of protein daily Carly Walker will continue current exercise regimen for weight loss and overall health benefits. We discussed the following Behavioral Modification Strategies today: increasing lean protein intake, increasing vegetables, holiday eating strategies, and planning for success    Carly Walker has agreed to follow up with our clinic in 3 to 4 weeks. She was informed of the importance of frequent follow up visits to maximize her success with intensive lifestyle modifications for her multiple health conditions.   OBESITY BEHAVIORAL INTERVENTION VISIT  Today's visit was # 26  Starting weight: 173 lbs Starting date: 03/17/17 Today's weight : 154 lbs Today's date: 06/17/2018 Total lbs lost to date: 19 At least 15 minutes  were spent on discussing the following behavioral intervention visit.   ASK: We discussed the diagnosis of obesity with Carly Walker today and Carly Walker agreed to give [place] permission to discuss obesity behavioral modification therapy today.  ASSESS: Carly Walker has the diagnosis of obesity and her BMI today is 29.11 Carly Walker is in the action stage of change   ADVISE: Tytionna was educated on the multiple health risks of obesity as well as the benefit of weight loss to improve her health. She was advised of the need for long term treatment and the importance of  lifestyle modifications to improve her current health and to decrease her risk of future health problems.  AGREE: Multiple dietary modification options and treatment options were discussed and  Latha agreed to follow the recommendations documented in the above note.  ARRANGE: Shannette was educated on the importance of frequent visits to treat obesity as outlined per CMS and USPSTF guidelines and agreed to schedule her next follow up appointment today.  Wilhemena Durie, am acting as Location manager for [employer], FNP-C.  I have reviewed the above documentation for accuracy and completeness, and I agree with the above.  - Champagne Paletta, FNP-C.

## 2018-06-18 LAB — COMPREHENSIVE METABOLIC PANEL
ALT: 32 IU/L (ref 0–32)
AST: 26 IU/L (ref 0–40)
Albumin/Globulin Ratio: 2.1 (ref 1.2–2.2)
Albumin: 4.6 g/dL (ref 3.6–4.8)
Alkaline Phosphatase: 106 IU/L (ref 39–117)
BUN/Creatinine Ratio: 25 (ref 12–28)
BUN: 22 mg/dL (ref 8–27)
Bilirubin Total: 0.6 mg/dL (ref 0.0–1.2)
CO2: 24 mmol/L (ref 20–29)
Calcium: 9.7 mg/dL (ref 8.7–10.3)
Chloride: 100 mmol/L (ref 96–106)
Creatinine, Ser: 0.88 mg/dL (ref 0.57–1.00)
GFR calc Af Amer: 79 mL/min/{1.73_m2} (ref >59)
GFR calc non Af Amer: 69 mL/min/{1.73_m2} (ref >59)
Globulin, Total: 2.2 g/dL (ref 1.5–4.5)
Glucose: 100 mg/dL — ABNORMAL HIGH (ref 65–99)
Potassium: 3.9 mmol/L (ref 3.5–5.2)
Sodium: 143 mmol/L (ref 134–144)
Total Protein: 6.8 g/dL (ref 6.0–8.5)

## 2018-06-18 LAB — LIPID PANEL WITH LDL/HDL RATIO
Cholesterol, Total: 157 mg/dL (ref 100–199)
HDL: 58 mg/dL (ref >39)
LDL Calculated: 72 mg/dL (ref 0–99)
LDl/HDL Ratio: 1.2 ratio (ref 0.0–3.2)
Triglycerides: 135 mg/dL (ref 0–149)
VLDL Cholesterol Cal: 27 mg/dL (ref 5–40)

## 2018-06-18 LAB — HEMOGLOBIN A1C
Est. average glucose Bld gHb Est-mCnc: 123 mg/dL
Hgb A1c MFr Bld: 5.9 % — ABNORMAL HIGH (ref 4.8–5.6)

## 2018-06-18 LAB — VITAMIN D 25 HYDROXY (VIT D DEFICIENCY, FRACTURES): Vit D, 25-Hydroxy: 59.5 ng/mL (ref 30.0–100.0)

## 2018-07-09 DIAGNOSIS — H25813 Combined forms of age-related cataract, bilateral: Secondary | ICD-10-CM | POA: Diagnosis not present

## 2018-07-09 DIAGNOSIS — H52203 Unspecified astigmatism, bilateral: Secondary | ICD-10-CM | POA: Diagnosis not present

## 2018-07-09 DIAGNOSIS — E119 Type 2 diabetes mellitus without complications: Secondary | ICD-10-CM | POA: Diagnosis not present

## 2018-07-09 DIAGNOSIS — H04123 Dry eye syndrome of bilateral lacrimal glands: Secondary | ICD-10-CM | POA: Diagnosis not present

## 2018-07-16 ENCOUNTER — Encounter (INDEPENDENT_AMBULATORY_CARE_PROVIDER_SITE_OTHER): Payer: Self-pay | Admitting: Family Medicine

## 2018-07-16 ENCOUNTER — Ambulatory Visit (INDEPENDENT_AMBULATORY_CARE_PROVIDER_SITE_OTHER): Payer: Medicare Other | Admitting: Family Medicine

## 2018-07-16 VITALS — BP 110/75 | HR 77 | Temp 97.7°F | Ht 61.0 in | Wt 151.0 lb

## 2018-07-16 DIAGNOSIS — F3289 Other specified depressive episodes: Secondary | ICD-10-CM

## 2018-07-16 DIAGNOSIS — Z6830 Body mass index (BMI) 30.0-30.9, adult: Secondary | ICD-10-CM | POA: Diagnosis not present

## 2018-07-16 DIAGNOSIS — E119 Type 2 diabetes mellitus without complications: Secondary | ICD-10-CM

## 2018-07-16 DIAGNOSIS — E669 Obesity, unspecified: Secondary | ICD-10-CM | POA: Diagnosis not present

## 2018-07-16 DIAGNOSIS — E7849 Other hyperlipidemia: Secondary | ICD-10-CM | POA: Diagnosis not present

## 2018-07-16 MED ORDER — ATORVASTATIN CALCIUM 20 MG PO TABS: 20.0000 mg | ORAL_TABLET | Freq: Every day | ORAL | 0 refills | Status: AC

## 2018-07-16 MED ORDER — BUPROPION HCL ER (SR) 200 MG PO TB12: 200.0000 mg | ORAL_TABLET | Freq: Every day | ORAL | 0 refills | Status: AC

## 2018-07-16 MED ORDER — METFORMIN HCL 500 MG PO TABS: ORAL_TABLET | ORAL | 0 refills | Status: AC

## 2018-07-20 NOTE — Progress Notes (Signed)
Office: [phone]  /  Fax: [phone]   HPI:   Chief Complaint: OBESITY Carly Walker is here to discuss her progress with her obesity treatment plan. She is on the keep a food journal with 1100 to 1300 calories and 80+ grams of protein daily plan and is following her eating plan approximately 30 % of the time. She states she is exercising 0 minutes 0 times per week. Carly Walker has been off track with a recent move and she is working to get back on track. She has been physically active with during her move. Her weight is 151 lb (68.5 kg) today and has had a weight loss of 3 pounds over a period of 4 weeks since her last visit. She has lost 22 lbs since starting treatment with [place].  Hyperlipidemia Carly Walker has hyperlipidemia and her last LDL was at goal (81) and HDL and triglycerides were within normal limits. She is on a statin. She has been trying to improve her cholesterol levels with intensive lifestyle modification including a low saturated fat diet, exercise and weight loss. She denies any chest pain or shortness of breath. The 10-year ASCVD risk score Carly Bussing DC Brooke Bonito., et al., 2013) is: 10.2%   Values used to calculate the score:     Age: 68 years     Sex: Female     Is Non-Hispanic African American: No     Diabetic: Yes     Tobacco smoker: No     Systolic Blood Pressure: 829 mmHg     Is BP treated: Yes     HDL Cholesterol: 58 mg/dL     Total Cholesterol: 157 mg/dL   Diabetes II Carly Walker has a diagnosis of diabetes type II. Carly Walker denies any hypoglycemic episodes. Last A1c was at 5.9. Carly Walker is well controlled on metformin. She has been working on intensive lifestyle modifications including diet, exercise, and weight loss to help control her blood glucose levels.  Depression with emotional eating behaviors Carly Walker is struggling with emotional eating and using food for comfort to the extent that it is negatively impacting her health. She often snacks when she is not hungry. Carly Walker  sometimes feels she is out of control and then feels guilty that she made poor food choices. Bupropion helps with cravings and she feels she has good control of her cravings. She has been working on behavior modification techniques to help reduce her emotional eating and has been somewhat successful. She shows no sign of suicidal or homicidal ideations.  Depression screen Carly Walker 2/9 11/27/2017 03/17/2017 11/19/2016 11/15/2016 10/22/2016  Decreased Interest 0 1 0 0 0  Down, Depressed, Hopeless 0 1 0 0 0  PHQ - 2 Score 0 2 0 0 0  Altered sleeping - 1 - - -  Tired, decreased energy - 2 - - -  Change in appetite - 3 - - -  Feeling bad or failure about yourself  - 1 - - -  Trouble concentrating - 1 - - -  Moving slowly or fidgety/restless - 0 - - -  Suicidal thoughts - 0 - - -  PHQ-9 Score - 10 - - -  Difficult doing work/chores - Somewhat difficult - - -     ASSESSMENT AND PLAN:  Other hyperlipidemia - Plan: atorvastatin (LIPITOR) 20 MG tablet  Type 2 diabetes mellitus without complication, without long-term current use of insulin (HCC) - Plan: metFORMIN (GLUCOPHAGE) 500 MG tablet  Other depression - with emotional eating - Plan: buPROPion (WELLBUTRIN SR) 200 MG 12  hr tablet  Class 1 obesity with serious comorbidity and body mass index (BMI) of 30.0 to 30.9 in adult, unspecified obesity type - beginning BMI >30  PLAN:  Hyperlipidemia Carly Walker was informed of the American Heart Association Guidelines emphasizing intensive lifestyle modifications as the first line treatment for hyperlipidemia. We discussed many lifestyle modifications today in depth, and Carly Walker will continue to work on decreasing saturated fats such as fatty red meat, butter and many fried foods. She will also increase vegetables and lean protein in her diet and continue to work on exercise and weight loss efforts. Carly Walker agreed to continue Atorvastatin 20 mg daily #90 with no refills and follow up as directed.  Diabetes  II Carly Walker has been given extensive diabetes education by myself today including ideal fasting and post-prandial blood glucose readings, individual ideal Hgb A1c goals and hypoglycemia prevention. We discussed the importance of good blood sugar control to decrease the likelihood of diabetic complications such as nephropathy, neuropathy, limb loss, blindness, coronary artery disease, and death. We discussed the importance of intensive lifestyle modification including diet, exercise and weight loss as the first line treatment for diabetes. Carly Walker agrees to continue metformin 500 mg BID # 180 with no refills and follow up at the agreed upon time.  Depression with Emotional Eating Behaviors We discussed behavior modification techniques today to help Carly Walker deal with her emotional eating and depression. She has agreed to take Wellbutrin SR 200 mg qAM #90 with no refills and follow up as directed.  Obesity Carly Walker is currently in the action stage of change. As such, her goal is to continue with weight loss efforts She has agreed to keep a food journal with 1100 to 1300 calories and 80+ grams of protein daily Carly Walker has been instructed to walk for 30 minutes 5 times per week for weight loss and overall health benefits. We discussed the following Behavioral Modification Strategies today: increasing lean protein intake, planning for success and keep a strict food journal  Carly Walker has agreed to follow up with our clinic in 2 to 3 months, since she has moved to [place]. She was informed of the importance of frequent follow up visits to maximize her success with intensive lifestyle modifications for her multiple health conditions.  ALLERGIES: Allergies  Allergen Reactions  . Ace Inhibitors Cough  . Codeine Nausea Only  . Hydrocodone   . Sulfa Antibiotics Hives    MEDICATIONS: Current Outpatient Medications on File Prior to Visit  Medication Sig Dispense Refill  . acyclovir (ZOVIRAX) 400 MG tablet  Take 400 mg by mouth daily. Ophthalmic HSV 1     . amLODipine (NORVASC) 10 MG tablet Take 1 tablet daily 90 tablet 3  . aspirin 81 MG tablet Take 81 mg by mouth daily.    . Blood Glucose Monitoring Suppl (ONETOUCH VERIO) w/Device KIT 1 kit by Does not apply route 2 (two) times daily. 1 kit 0  . Calcium Carb-Cholecalciferol (CALCIUM 600+D3) 600-800 MG-UNIT TABS Take 2 tablets by mouth daily.    . cetirizine (ZYRTEC) 10 MG tablet Take 10 mg by mouth daily.    Marland Kitchen glucose blood (ONETOUCH VERIO) test strip TEST BLOOD SUGARS TWICE DAILY 200 each 0  . hydrochlorothiazide (MICROZIDE) 12.5 MG capsule TAKE 1 CAPSULE BY MOUTH EVERY DAY 90 capsule 3  . ketotifen (ZADITOR) 0.025 % ophthalmic solution 1 drop 2 (two) times daily.     . Multiple Vitamin (MULTIVITAMIN) tablet Take 1 tablet by mouth daily. Over the counter, for over 50 individuals(  called 50+)     . omeprazole (PRILOSEC OTC) 20 MG tablet Take 20 mg by mouth daily.    [name] DELICA LANCETS 16L MISC 1 Bottle by Does not apply route 2 (two) times daily. 60 each 0  . Vitamin D, Ergocalciferol, (DRISDOL) 1.25 MG (50000 UT) CAPS capsule Take 1 capsule (50,000 Units total) by mouth every 7 (seven) days. 12 capsule 0   No current facility-administered medications on file prior to visit.     PAST MEDICAL HISTORY: Past Medical History:  Diagnosis Date  . Allergic rhinitis, cause unspecified   . Allergy    SEASONAL  . Arthritis    LOWER BACK  . Back pain   . Cancer [facility])    ENDOMETRIAL CANCER  . Cancer ([place])   . Chest pain   . Colon polyps 01/23/2006   type unknown   . Constipation   . Depression   . Essential hypertension, benign   . Fatty liver   . GERD (gastroesophageal reflux disease)   . History of endometrial cancer 07/2009  . HTN (hypertension)   . Hypercholesteremia   . IBS (irritable bowel syndrome)   . Joint pain   . Leg edema   . Microscopic hematuria   . Migraine   . Mixed hyperlipidemia   . Ophthalmic herpes simplex     . Palpitations   . Proteinuria   . Tremor, essential   . Vitamin D deficiency     PAST SURGICAL HISTORY: Past Surgical History:  Procedure Laterality Date  . ABDOMINAL HYSTERECTOMY    . APPENDECTOMY    . CESAREAN SECTION    . CYST REMOVED FROM R WRIST    . HERNIA REPAIR  03/2010  . REPEAT CESAREAN SECTION    . TUBAL LIGATION    . WRIST FRACTURE SURGERY Left 07/31/2015    SOCIAL HISTORY: Social History   Tobacco Use  . Smoking status: Never Smoker  . Smokeless tobacco: Never Used  Substance Use Topics  . Alcohol use: No    Alcohol/week: 0.0 standard drinks  . Drug use: No    FAMILY HISTORY: Family History  Problem Relation Age of Onset  . Breast cancer Mother   . Emphysema Father   . Diabetes Father   . Heart disease Father   . Alcoholism Father   . Heart disease Paternal Grandmother   . Diabetes Brother   . Heart disease Brother     ROS: Review of Systems  Constitutional: Positive for weight loss.  Respiratory: Negative for shortness of breath.   Cardiovascular: Negative for chest pain.  Endo/Heme/Allergies:       Negative for hypoglycemia  Psychiatric/Behavioral: Positive for depression. Negative for suicidal ideas.    PHYSICAL EXAM: Blood pressure 110/75, pulse 77, temperature 97.7 F (36.5 C), temperature source Oral, height '5\' 1"'  (1.549 m), weight 151 lb (68.5 kg), SpO2 97 %. Body mass index is 28.53 kg/m. Physical Exam Vitals signs reviewed.  Constitutional:      Appearance: Normal appearance. She is well-developed. She is obese.  Cardiovascular:     Rate and Rhythm: Normal rate.  Pulmonary:     Effort: Pulmonary effort is normal.  Musculoskeletal: Normal range of motion.  Skin:    General: Skin is warm and dry.  Neurological:     Mental Status: She is alert and oriented to person, place, and time.  Psychiatric:        Mood and Affect: Mood normal.        Behavior:  Behavior normal.        Thought Content: Thought content does not  include homicidal or suicidal ideation.     RECENT LABS AND TESTS: BMET    Component Value Date/Time   NA 143 06/17/2018 0856   K 3.9 06/17/2018 0856   CL 100 06/17/2018 0856   CO2 24 06/17/2018 0856   GLUCOSE 100 (H) 06/17/2018 0856   GLUCOSE 102 (H) 12/08/2015 1037   BUN 22 06/17/2018 0856   CREATININE 0.88 06/17/2018 0856   CREATININE 0.82 12/08/2015 1037   CALCIUM 9.7 06/17/2018 0856   GFRNONAA 69 06/17/2018 0856   GFRNONAA 76 12/08/2015 1037   GFRAA 79 06/17/2018 0856   GFRAA 88 12/08/2015 1037   Lab Results  Component Value Date   HGBA1C 5.9 (H) 06/17/2018   HGBA1C 5.9 (H) 03/18/2018   HGBA1C 5.8 (H) 11/26/2017   HGBA1C 5.8 (H) 08/21/2017   HGBA1C 7.1 (H) 03/17/2017   Lab Results  Component Value Date   INSULIN 19.3 03/18/2018   INSULIN 13.3 11/26/2017   INSULIN 11.7 08/21/2017   INSULIN 30.9 (H) 03/17/2017   CBC    Component Value Date/Time   WBC 6.2 03/17/2017 1220   WBC 7.1 12/08/2015 1058   WBC 5.1 09/15/2014 0942   RBC 5.11 03/17/2017 1220   RBC 5.11 12/08/2015 1058   RBC 4.90 09/15/2014 0942   HGB 14.7 03/17/2017 1220   HCT 44.0 03/17/2017 1220   PLT 297 10/22/2016 1457   MCV 86 03/17/2017 1220   MCH 28.8 03/17/2017 1220   MCH 31.1 12/08/2015 1058   MCH 30.4 09/15/2014 0942   MCHC 33.4 03/17/2017 1220   MCHC 35.9 (A) 12/08/2015 1058   MCHC 34.5 09/15/2014 0942   RDW 14.6 03/17/2017 1220   LYMPHSABS 2.6 03/17/2017 1220   MONOABS 0.4 09/15/2014 0942   EOSABS 0.2 03/17/2017 1220   BASOSABS 0.0 03/17/2017 1220   Iron/TIBC/Ferritin/ %Sat    Component Value Date/Time   IRON 97 12/18/2015 1218   TIBC 394 12/18/2015 1218   FERRITIN 31 12/18/2015 1218   IRONPCTSAT 25 12/18/2015 1218   Lipid Panel     Component Value Date/Time   CHOL 157 06/17/2018 0856   TRIG 135 06/17/2018 0856   HDL 58 06/17/2018 0856   CHOLHDL 3.5 10/22/2016 1457   CHOLHDL 3.6 12/08/2015 1037   VLDL 33 (H) 12/08/2015 1037   LDLCALC 72 06/17/2018 0856   Hepatic  Function Panel     Component Value Date/Time   PROT 6.8 06/17/2018 0856   ALBUMIN 4.6 06/17/2018 0856   AST 26 06/17/2018 0856   ALT 32 06/17/2018 0856   ALKPHOS 106 06/17/2018 0856   BILITOT 0.6 06/17/2018 0856      Component Value Date/Time   TSH 1.800 03/17/2017 1220   TSH 2.220 10/22/2016 1457   TSH 1.75 12/08/2015 1037    Ref. Range 06/17/2018 08:56  Vitamin D, 25-Hydroxy Latest Ref Range: 30.0 - 100.0 ng/mL 59.5     OBESITY BEHAVIORAL INTERVENTION VISIT  Today's visit was # 27  Starting weight: 173 lbs Starting date: 03/17/2017 Today's weight : 151 lbs Today's date: 07/16/2018 Total lbs lost to date: 22 At least 15 minutes were spent on discussing the following behavioral intervention visit.   ASK: We discussed the diagnosis of obesity with Carly Walker today and Carly Walker agreed to give [place] permission to discuss obesity behavioral modification therapy today.  ASSESS: Carly Walker has the diagnosis of obesity and her BMI today is 28.55 Carly Walker is  in the action stage of change   ADVISE: Carrianne was educated on the multiple health risks of obesity as well as the benefit of weight loss to improve her health. She was advised of the need for long term treatment and the importance of lifestyle modifications to improve her current health and to decrease her risk of future health problems.  AGREE: Multiple dietary modification options and treatment options were discussed and  Zully agreed to follow the recommendations documented in the above note.  ARRANGE: Kristyana was educated on the importance of frequent visits to treat obesity as outlined per CMS and USPSTF guidelines and agreed to schedule her next follow up appointment today.  Corey Skains, am acting as Location manager for [employer], FNP-C.  I have reviewed the above documentation for accuracy and completeness, and I agree with the above.  - Dawn Whitmire, FNP-C.

## 2018-07-23 ENCOUNTER — Encounter (INDEPENDENT_AMBULATORY_CARE_PROVIDER_SITE_OTHER): Payer: Self-pay | Admitting: Family Medicine

## 2018-09-29 ENCOUNTER — Ambulatory Visit (INDEPENDENT_AMBULATORY_CARE_PROVIDER_SITE_OTHER): Payer: Medicare Other | Admitting: Family Medicine

## 2018-10-05 ENCOUNTER — Other Ambulatory Visit (INDEPENDENT_AMBULATORY_CARE_PROVIDER_SITE_OTHER): Payer: Self-pay | Admitting: Family Medicine

## 2018-10-05 DIAGNOSIS — E7849 Other hyperlipidemia: Secondary | ICD-10-CM

## 2018-10-12 DIAGNOSIS — E119 Type 2 diabetes mellitus without complications: Secondary | ICD-10-CM | POA: Diagnosis not present

## 2018-10-12 DIAGNOSIS — F4322 Adjustment disorder with anxiety: Secondary | ICD-10-CM | POA: Diagnosis not present

## 2018-10-12 DIAGNOSIS — K219 Gastro-esophageal reflux disease without esophagitis: Secondary | ICD-10-CM | POA: Diagnosis not present

## 2018-10-12 DIAGNOSIS — E559 Vitamin D deficiency, unspecified: Secondary | ICD-10-CM | POA: Diagnosis not present

## 2018-10-12 DIAGNOSIS — I10 Essential (primary) hypertension: Secondary | ICD-10-CM | POA: Diagnosis not present

## 2018-10-12 DIAGNOSIS — Z0289 Encounter for other administrative examinations: Secondary | ICD-10-CM | POA: Diagnosis not present

## 2018-10-12 DIAGNOSIS — E782 Mixed hyperlipidemia: Secondary | ICD-10-CM | POA: Diagnosis not present

## 2018-10-12 DIAGNOSIS — B0229 Other postherpetic nervous system involvement: Secondary | ICD-10-CM | POA: Diagnosis not present

## 2018-12-02 DIAGNOSIS — L918 Other hypertrophic disorders of the skin: Secondary | ICD-10-CM | POA: Diagnosis not present

## 2018-12-02 DIAGNOSIS — D1800 Hemangioma unspecified site: Secondary | ICD-10-CM | POA: Diagnosis not present

## 2018-12-02 DIAGNOSIS — L570 Actinic keratosis: Secondary | ICD-10-CM | POA: Diagnosis not present

## 2018-12-02 DIAGNOSIS — L821 Other seborrheic keratosis: Secondary | ICD-10-CM | POA: Diagnosis not present

## 2018-12-02 DIAGNOSIS — L814 Other melanin hyperpigmentation: Secondary | ICD-10-CM | POA: Diagnosis not present

## 2018-12-02 DIAGNOSIS — L738 Other specified follicular disorders: Secondary | ICD-10-CM | POA: Diagnosis not present

## 2018-12-02 DIAGNOSIS — Z85828 Personal history of other malignant neoplasm of skin: Secondary | ICD-10-CM | POA: Diagnosis not present

## 2018-12-02 DIAGNOSIS — D239 Other benign neoplasm of skin, unspecified: Secondary | ICD-10-CM | POA: Diagnosis not present

## 2019-03-02 DIAGNOSIS — K219 Gastro-esophageal reflux disease without esophagitis: Secondary | ICD-10-CM | POA: Diagnosis not present

## 2019-03-02 DIAGNOSIS — F4322 Adjustment disorder with anxiety: Secondary | ICD-10-CM | POA: Diagnosis not present

## 2019-03-02 DIAGNOSIS — B0229 Other postherpetic nervous system involvement: Secondary | ICD-10-CM | POA: Diagnosis not present

## 2019-03-02 DIAGNOSIS — Z23 Encounter for immunization: Secondary | ICD-10-CM | POA: Diagnosis not present

## 2019-03-02 DIAGNOSIS — Z1231 Encounter for screening mammogram for malignant neoplasm of breast: Secondary | ICD-10-CM | POA: Diagnosis not present

## 2019-03-02 DIAGNOSIS — Z79899 Other long term (current) drug therapy: Secondary | ICD-10-CM | POA: Diagnosis not present

## 2019-03-02 DIAGNOSIS — Z0000 Encounter for general adult medical examination without abnormal findings: Secondary | ICD-10-CM | POA: Diagnosis not present

## 2019-03-02 DIAGNOSIS — Z86010 Personal history of colonic polyps: Secondary | ICD-10-CM | POA: Diagnosis not present

## 2019-03-02 DIAGNOSIS — I10 Essential (primary) hypertension: Secondary | ICD-10-CM | POA: Diagnosis not present

## 2019-03-02 DIAGNOSIS — E559 Vitamin D deficiency, unspecified: Secondary | ICD-10-CM | POA: Diagnosis not present

## 2019-03-02 DIAGNOSIS — R809 Proteinuria, unspecified: Secondary | ICD-10-CM | POA: Diagnosis not present

## 2019-03-02 DIAGNOSIS — E782 Mixed hyperlipidemia: Secondary | ICD-10-CM | POA: Diagnosis not present

## 2019-03-02 DIAGNOSIS — E119 Type 2 diabetes mellitus without complications: Secondary | ICD-10-CM | POA: Diagnosis not present

## 2019-03-29 DIAGNOSIS — Z1231 Encounter for screening mammogram for malignant neoplasm of breast: Secondary | ICD-10-CM | POA: Diagnosis not present

## 2019-03-31 DIAGNOSIS — Z23 Encounter for immunization: Secondary | ICD-10-CM | POA: Diagnosis not present

## 2019-07-12 DIAGNOSIS — H52223 Regular astigmatism, bilateral: Secondary | ICD-10-CM | POA: Diagnosis not present

## 2019-07-12 DIAGNOSIS — E119 Type 2 diabetes mellitus without complications: Secondary | ICD-10-CM | POA: Diagnosis not present

## 2019-07-12 DIAGNOSIS — H524 Presbyopia: Secondary | ICD-10-CM | POA: Diagnosis not present

## 2019-07-12 DIAGNOSIS — H25813 Combined forms of age-related cataract, bilateral: Secondary | ICD-10-CM | POA: Diagnosis not present

## 2019-09-02 DIAGNOSIS — B0229 Other postherpetic nervous system involvement: Secondary | ICD-10-CM | POA: Diagnosis not present

## 2019-09-02 DIAGNOSIS — E1122 Type 2 diabetes mellitus with diabetic chronic kidney disease: Secondary | ICD-10-CM | POA: Diagnosis not present

## 2019-09-02 DIAGNOSIS — I10 Essential (primary) hypertension: Secondary | ICD-10-CM | POA: Diagnosis not present

## 2019-09-21 DIAGNOSIS — R946 Abnormal results of thyroid function studies: Secondary | ICD-10-CM | POA: Diagnosis not present

## 2019-11-20 IMAGING — DX DG LUMBAR SPINE COMPLETE 4+V
5 series · 5 of 5 positions shown · non-contrast
Comparison: None.

CLINICAL DATA: Lumbago

EXAM:
LUMBAR SPINE - COMPLETE 4+ VIEW

[l-spine ap]
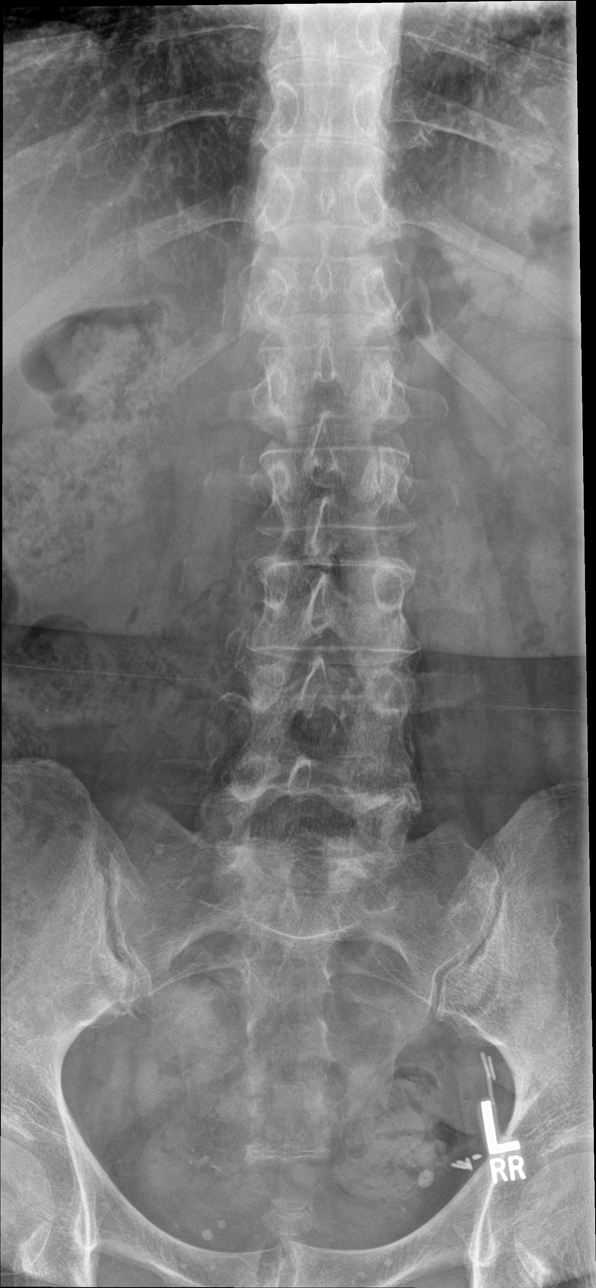

[l-spine obl (1 of 2)]
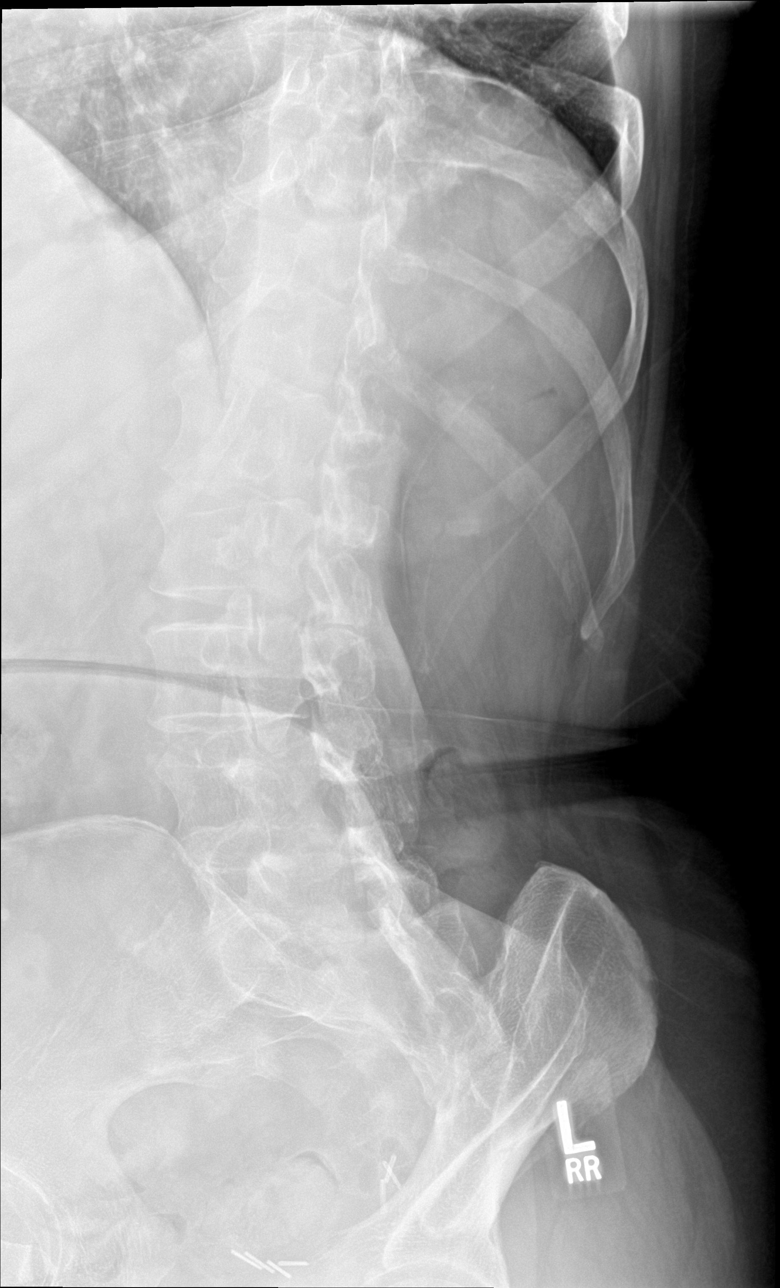

[l-spine obl (2 of 2)]
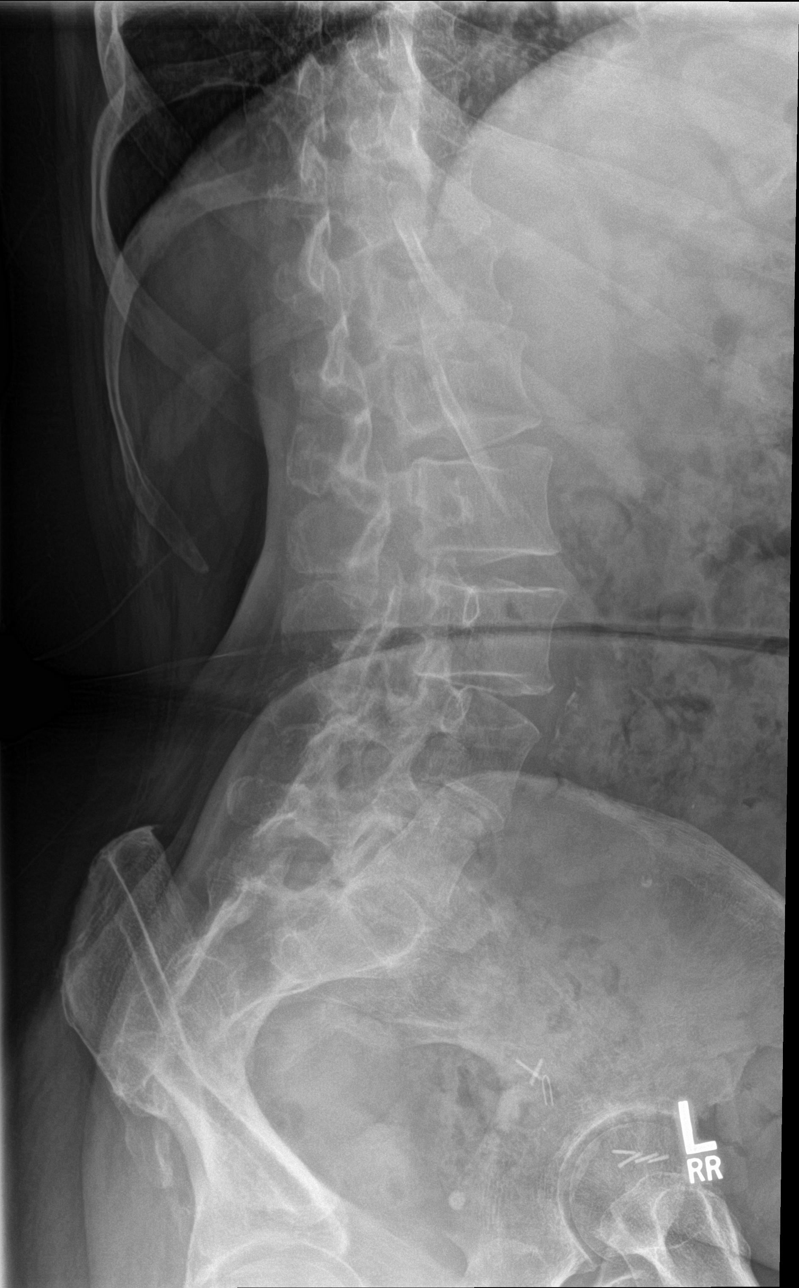

[l-spine lat]
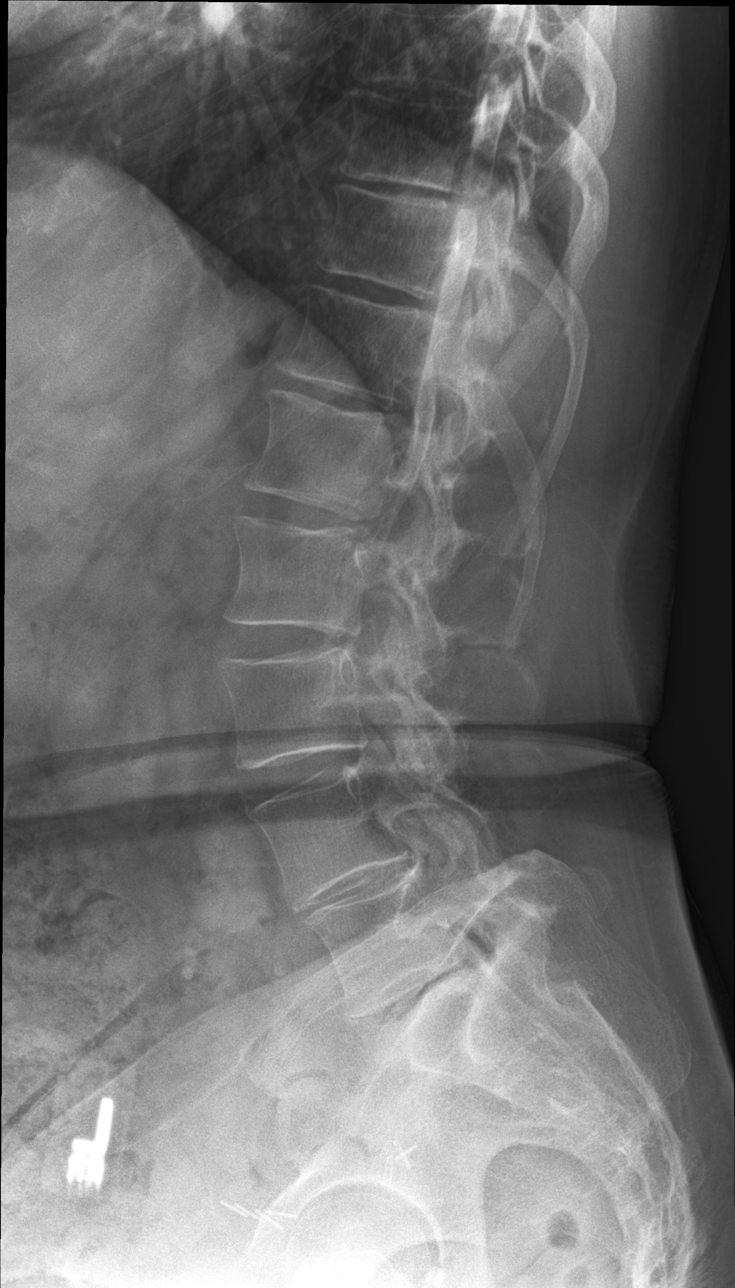

[l-spine l5-s1]
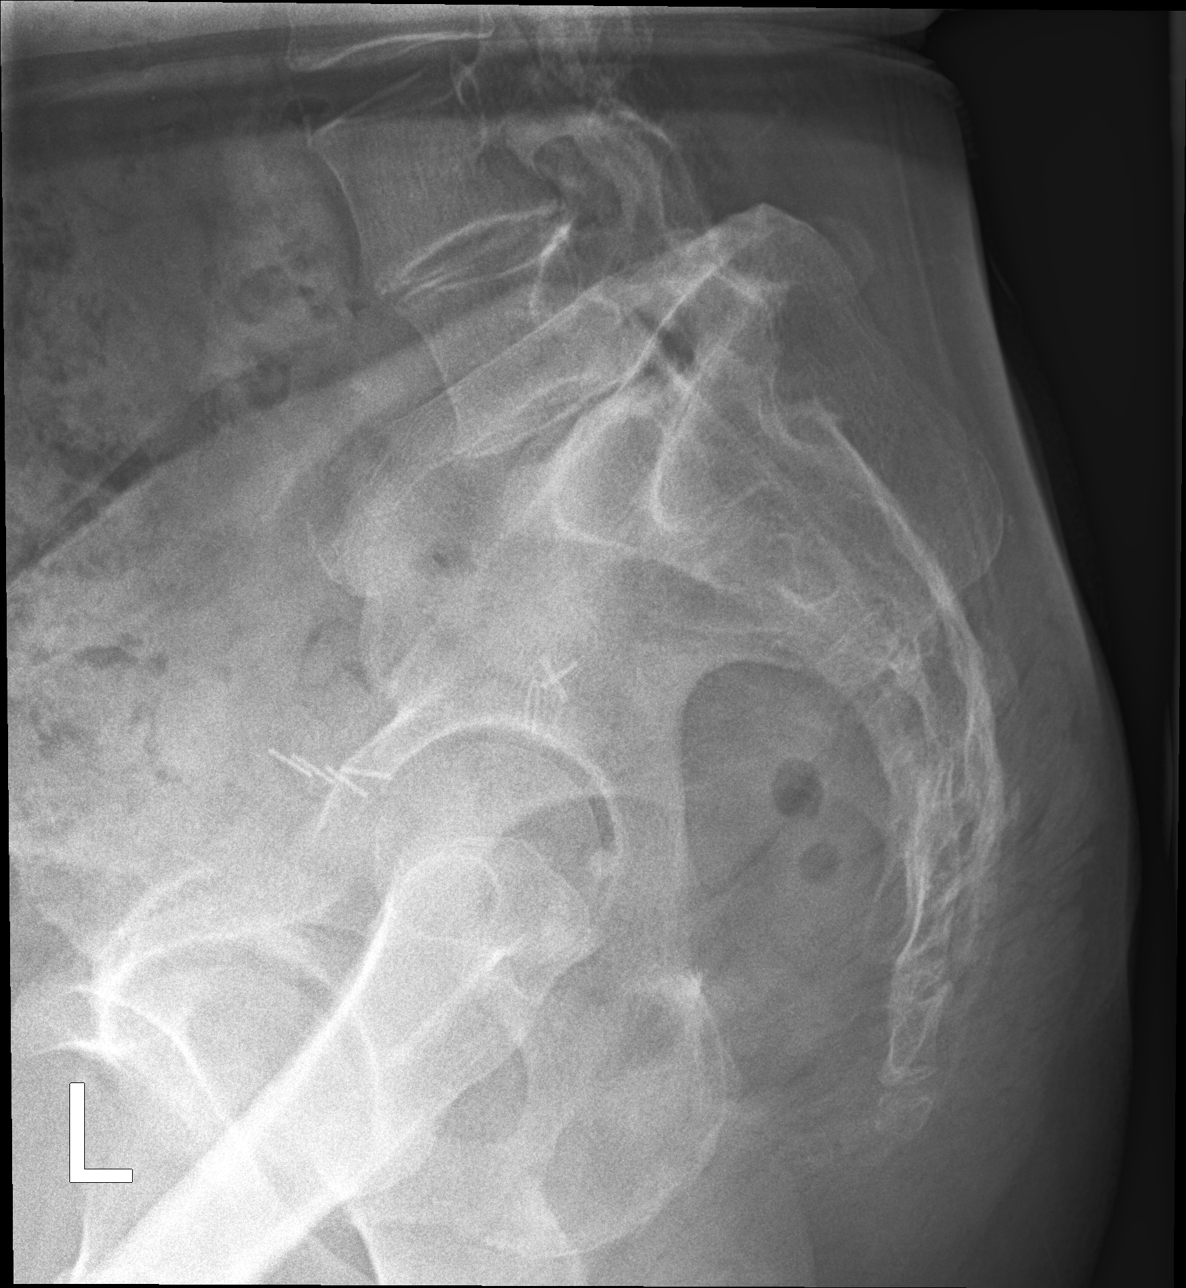

[5 of 5 positions shown; findings below may reference images not displayed]

FINDINGS: Frontal, lateral, spot lumbosacral lateral, and bilateral oblique
views were obtained. There are 5 non-rib-bearing lumbar type
vertebral bodies. There is slight rotatory scoliosis. There is no
appreciable fracture or spondylolisthesis. There is moderate disc
space narrowing at L4-5 and L5-S1. There is also mild disc space
narrowing at T12-L1. There is facet osteoarthritic change at L4-5
and L5-S1 bilaterally. There are surgical clips in the pelvis. There
is aortic atherosclerosis.
IMPRESSION: Areas of osteoarthritic change, most notably at L4-5 and L5-S1. No
fracture or spondylolisthesis. There is aortic atherosclerosis.

Aortic Atherosclerosis ([id number]).
